# Patient Record
Sex: Female | Born: 1945 | Race: White | Hispanic: No | Marital: Married | Smoking: Former smoker
Health system: Southern US, Community
[De-identification: ages and names within clinical notes are randomized; demographics above are authoritative.]

## PROBLEM LIST (undated history)

## (undated) DIAGNOSIS — R42 Dizziness and giddiness: Secondary | ICD-10-CM

## (undated) DIAGNOSIS — L4050 Arthropathic psoriasis, unspecified: Secondary | ICD-10-CM

## (undated) DIAGNOSIS — Z9221 Personal history of antineoplastic chemotherapy: Secondary | ICD-10-CM

## (undated) DIAGNOSIS — E559 Vitamin D deficiency, unspecified: Secondary | ICD-10-CM

## (undated) DIAGNOSIS — M62838 Other muscle spasm: Secondary | ICD-10-CM

## (undated) DIAGNOSIS — R0789 Other chest pain: Secondary | ICD-10-CM

## (undated) DIAGNOSIS — E039 Hypothyroidism, unspecified: Secondary | ICD-10-CM

## (undated) DIAGNOSIS — I2510 Atherosclerotic heart disease of native coronary artery without angina pectoris: Secondary | ICD-10-CM

## (undated) DIAGNOSIS — L409 Psoriasis, unspecified: Secondary | ICD-10-CM

## (undated) DIAGNOSIS — H269 Unspecified cataract: Secondary | ICD-10-CM

## (undated) DIAGNOSIS — M1990 Unspecified osteoarthritis, unspecified site: Secondary | ICD-10-CM

## (undated) DIAGNOSIS — R32 Unspecified urinary incontinence: Secondary | ICD-10-CM

## (undated) DIAGNOSIS — K219 Gastro-esophageal reflux disease without esophagitis: Secondary | ICD-10-CM

## (undated) DIAGNOSIS — N950 Postmenopausal bleeding: Secondary | ICD-10-CM

## (undated) DIAGNOSIS — K2100 Gastro-esophageal reflux disease with esophagitis, without bleeding: Secondary | ICD-10-CM

## (undated) DIAGNOSIS — Z923 Personal history of irradiation: Secondary | ICD-10-CM

## (undated) DIAGNOSIS — R112 Nausea with vomiting, unspecified: Secondary | ICD-10-CM

## (undated) DIAGNOSIS — C50919 Malignant neoplasm of unspecified site of unspecified female breast: Secondary | ICD-10-CM

## (undated) DIAGNOSIS — R151 Fecal smearing: Secondary | ICD-10-CM

## (undated) DIAGNOSIS — Z9889 Other specified postprocedural states: Secondary | ICD-10-CM

## (undated) DIAGNOSIS — N059 Unspecified nephritic syndrome with unspecified morphologic changes: Secondary | ICD-10-CM

## (undated) DIAGNOSIS — G4700 Insomnia, unspecified: Secondary | ICD-10-CM

## (undated) DIAGNOSIS — E669 Obesity, unspecified: Secondary | ICD-10-CM

## (undated) DIAGNOSIS — I5189 Other ill-defined heart diseases: Secondary | ICD-10-CM

## (undated) DIAGNOSIS — R011 Cardiac murmur, unspecified: Secondary | ICD-10-CM

## (undated) HISTORY — DX: Postmenopausal bleeding: N95.0

## (undated) HISTORY — DX: Unspecified cataract: H26.9

## (undated) HISTORY — DX: Malignant neoplasm of unspecified site of unspecified female breast: C50.919

## (undated) HISTORY — DX: Hypothyroidism, unspecified: E03.9

## (undated) HISTORY — DX: Insomnia, unspecified: G47.00

## (undated) HISTORY — DX: Gastro-esophageal reflux disease with esophagitis, without bleeding: K21.00

## (undated) HISTORY — DX: Vitamin D deficiency, unspecified: E55.9

## (undated) HISTORY — PX: OTHER SURGICAL HISTORY: SHX169

## (undated) HISTORY — DX: Dizziness and giddiness: R42

## (undated) HISTORY — PX: ENDOMETRIAL BIOPSY: SHX622

## (undated) HISTORY — DX: Cardiac murmur, unspecified: R01.1

## (undated) HISTORY — DX: Obesity, unspecified: E66.9

## (undated) HISTORY — DX: Atherosclerotic heart disease of native coronary artery without angina pectoris: I25.10

## (undated) HISTORY — DX: Unspecified urinary incontinence: R32

## (undated) HISTORY — DX: Unspecified nephritic syndrome with unspecified morphologic changes: N05.9

## (undated) HISTORY — DX: Psoriasis, unspecified: L40.9

## (undated) HISTORY — DX: Fecal smearing: R15.1

## (undated) HISTORY — DX: Other chest pain: R07.89

## (undated) HISTORY — DX: Arthropathic psoriasis, unspecified: L40.50

## (undated) HISTORY — PX: CARDIAC CATHETERIZATION: SHX172

## (undated) HISTORY — DX: Other ill-defined heart diseases: I51.89

## (undated) HISTORY — DX: Other muscle spasm: M62.838

## (undated) HISTORY — DX: Gastro-esophageal reflux disease with esophagitis: K21.0

---

## 1983-11-08 HISTORY — PX: MASTECTOMY: SHX3

## 1984-11-07 DIAGNOSIS — C50919 Malignant neoplasm of unspecified site of unspecified female breast: Secondary | ICD-10-CM

## 1984-11-07 DIAGNOSIS — Z923 Personal history of irradiation: Secondary | ICD-10-CM

## 1984-11-07 HISTORY — DX: Personal history of irradiation: Z92.3

## 1984-11-07 HISTORY — DX: Malignant neoplasm of unspecified site of unspecified female breast: C50.919

## 1988-11-07 HISTORY — PX: BREAST BIOPSY: SHX20

## 1988-11-07 HISTORY — PX: BREAST EXCISIONAL BIOPSY: SUR124

## 2004-12-31 ENCOUNTER — Ambulatory Visit: Payer: Self-pay | Admitting: Internal Medicine

## 2005-11-07 LAB — HM COLONOSCOPY: HM Colonoscopy: NORMAL

## 2006-04-04 ENCOUNTER — Inpatient Hospital Stay: Payer: Self-pay | Admitting: Internal Medicine

## 2008-09-11 ENCOUNTER — Ambulatory Visit: Payer: Self-pay | Admitting: Rheumatology

## 2010-10-26 ENCOUNTER — Ambulatory Visit: Payer: Self-pay | Admitting: Family Medicine

## 2011-09-21 ENCOUNTER — Ambulatory Visit: Payer: Self-pay

## 2011-10-11 ENCOUNTER — Ambulatory Visit: Payer: Self-pay | Admitting: Family Medicine

## 2012-06-06 ENCOUNTER — Ambulatory Visit: Payer: Self-pay | Admitting: Family Medicine

## 2012-06-13 ENCOUNTER — Ambulatory Visit: Payer: Self-pay | Admitting: Family Medicine

## 2012-07-02 ENCOUNTER — Ambulatory Visit: Payer: Self-pay | Admitting: Internal Medicine

## 2013-05-04 ENCOUNTER — Ambulatory Visit: Payer: Self-pay | Admitting: Emergency Medicine

## 2013-05-04 LAB — URINALYSIS, COMPLETE
Bilirubin,UR: NEGATIVE
Glucose,UR: NEGATIVE mg/dL (ref 0–75)
Nitrite: POSITIVE
Ph: 6 (ref 4.5–8.0)
Specific Gravity: 1.02 (ref 1.003–1.030)

## 2013-08-14 ENCOUNTER — Ambulatory Visit: Payer: Self-pay | Admitting: Family Medicine

## 2013-08-14 LAB — HM MAMMOGRAPHY: HM MAMMO: NORMAL

## 2013-08-19 LAB — HM PAP SMEAR: HM PAP: NORMAL

## 2013-08-21 ENCOUNTER — Ambulatory Visit: Payer: Self-pay | Admitting: Family Medicine

## 2013-08-21 LAB — HM DEXA SCAN

## 2014-02-19 ENCOUNTER — Encounter: Payer: Self-pay | Admitting: *Deleted

## 2014-02-19 LAB — LIPID PANEL
Cholesterol: 175 mg/dL (ref 0–200)
HDL: 70 mg/dL (ref 35–70)
LDL CALC: 84 mg/dL
Triglycerides: 105 mg/dL (ref 40–160)

## 2014-02-20 ENCOUNTER — Ambulatory Visit (INDEPENDENT_AMBULATORY_CARE_PROVIDER_SITE_OTHER): Payer: Medicare Other | Admitting: Cardiovascular Disease

## 2014-02-20 ENCOUNTER — Encounter: Payer: Self-pay | Admitting: Cardiovascular Disease

## 2014-02-20 ENCOUNTER — Encounter (INDEPENDENT_AMBULATORY_CARE_PROVIDER_SITE_OTHER): Payer: Self-pay

## 2014-02-20 VITALS — BP 145/92 | HR 80 | Ht 67.0 in | Wt 197.8 lb

## 2014-02-20 DIAGNOSIS — R079 Chest pain, unspecified: Secondary | ICD-10-CM

## 2014-02-20 DIAGNOSIS — R0609 Other forms of dyspnea: Secondary | ICD-10-CM

## 2014-02-20 DIAGNOSIS — R0989 Other specified symptoms and signs involving the circulatory and respiratory systems: Secondary | ICD-10-CM

## 2014-02-20 DIAGNOSIS — R000 Tachycardia, unspecified: Secondary | ICD-10-CM

## 2014-02-20 DIAGNOSIS — R0600 Dyspnea, unspecified: Secondary | ICD-10-CM | POA: Insufficient documentation

## 2014-02-20 NOTE — Progress Notes (Signed)
PCP: Dr. Ancil Boozer   HPI  This is a pleasant 68 year old female who was referred for evaluation of chest pain and shortness of breath. She has no previous cardiac history. She had nuclear stress test done in 2003 which was normal. She has known history of hypothyroidism, gastroesophageal reflux disease and history of breast cancer with extensive radiation therapy in 1986. She is retired from [employer]. She is a lifelong nonsmoker with no family history of premature coronary artery disease. Father did have atrial fibrillation and myocardial infarction in his late 61s. Over the last 6 months, she has experienced intermittent episodes of dull chest pain substernally with radiation to the left side. This usually lasts for about 5 minutes. It is random and can happen at rest or with physical activities. She also has noticed progressive dyspnea with activities. She reports being less active since her retirement and has gained about 15 pounds over the last 4-5 years. There is no orthopnea, PND or lower extremity edema.  No Known Allergies   Current Outpatient Prescriptions on File Prior to Visit  Medication Sig Dispense Refill  . Cholecalciferol (VITAMIN D) 2000 UNITS tablet Take 2,000 Units by mouth daily.      Marland Kitchen etanercept (ENBREL) 25 MG injection Inject 25 mg into the skin as needed.      Marland Kitchen levothyroxine (SYNTHROID, LEVOTHROID) 75 MCG tablet Take 75 mcg by mouth daily before breakfast.      . Melatonin 3 MG TABS Take by mouth daily.      . MULTIPLE VITAMINS PO Take by mouth daily.       No current facility-administered medications on file prior to visit.     Past Medical History  Diagnosis Date  . Reflux esophagitis   . Unspecified urinary incontinence   . Fecal smearing   . Insomnia, unspecified   . Obesity, unspecified   . Spasm of muscle   . Unspecified vitamin D deficiency   . Unspecified hypothyroidism   . Psoriatic arthropathy   . History of breast  cancer   . Postmenopausal bleeding   . Dizziness and giddiness   . Bright's disease     Hx  . Heart murmur     as child  . Breast cancer      Past Surgical History  Procedure Laterality Date  . Mastectomy    . Endometrial biopsy    . Colonoscopy    . Cardiac catheterization  30's    [facility]; no stent     Family History  Problem Relation Age of Onset  . Uterine cancer Mother   . Lung cancer Mother   . Stroke Mother   . Hypertension Mother   . Heart disease Father   . Arrhythmia Father     afib  . Cervical cancer Sister      History   Social History  . Marital Status: Married    Spouse Name: N/A    Number of Children: N/A  . Years of Education: N/A   Occupational History  . Not on file.   Social History Main Topics  . Smoking status: Former Smoker -- 0.25 packs/day for 10 years    Types: Cigarettes  . Smokeless tobacco: Not on file  . Alcohol Use: No  . Drug Use: No  . Sexual Activity: Not on file   Other Topics Concern  . Not on file   Social History Narrative  . No narrative on file     ROS A 10  point review of system was performed. It is negative other than that mentioned in the history of present illness.   PHYSICAL EXAM   BP 145/92  Pulse 80  Ht 5\' 7"  (1.702 m)  Wt 197 lb 12 oz (89.699 kg)  BMI 30.96 kg/m2 Constitutional: She is oriented to person, place, and time. She appears well-developed and well-nourished. No distress.  HENT: No nasal discharge.  Head: Normocephalic and atraumatic.  Eyes: Pupils are equal and round. No discharge.  Neck: Normal range of motion. Neck supple. No JVD present. No thyromegaly present.  Cardiovascular: Normal rate, regular rhythm, normal heart sounds. Exam reveals no gallop and no friction rub. No murmur heard.  Pulmonary/Chest: Effort normal and breath sounds normal. No stridor. No respiratory distress. She has no wheezes. She has no rales. She exhibits no tenderness.  Abdominal: Soft. Bowel sounds are  normal. She exhibits no distension. There is no tenderness. There is no rebound and no guarding.  Musculoskeletal: Normal range of motion. She exhibits no edema and no tenderness.  Neurological: She is alert and oriented to person, place, and time. Coordination normal.  Skin: Skin is warm and dry. No rash noted. She is not diaphoretic. No erythema. No pallor.  Psychiatric: She has a normal mood and affect. Her behavior is normal. Judgment and thought content normal.     EKG: Normal sinus rhythm with no significant ST or T wave changes.   ASSESSMENT AND PLAN

## 2014-02-20 NOTE — Assessment & Plan Note (Signed)
Overall atypical chest pain. Cardiac exam is unremarkable and baseline EKG is normal. I recommend further evaluation with a treadmill stress test to rule out ischemic etiology.

## 2014-02-20 NOTE — Assessment & Plan Note (Signed)
She reports progressive exertional dyspnea which could be due to physical deconditioning. However, she received extensive radiation therapy for breast cancer years ago and thus she is at risk for cardiomyopathy or pericardial disease. I requested an echocardiogram to evaluate LV systolic function, valvular structure and  pulmonary pressure.

## 2014-02-20 NOTE — Patient Instructions (Signed)
Your physician has requested that you have an exercise tolerance test. For further information please visit [email or web address]. Please also follow instruction sheet, as given.   Your physician has requested that you have an echocardiogram. Echocardiography is a painless test that uses sound waves to create images of your heart. It provides your doctor with information about the size and shape of your heart and how well your heart's chambers and valves are working. This procedure takes approximately one hour. There are no restrictions for this procedure.  Your physician recommends that you schedule a follow-up appointment in:  As needed

## 2014-03-04 ENCOUNTER — Other Ambulatory Visit (INDEPENDENT_AMBULATORY_CARE_PROVIDER_SITE_OTHER): Payer: Medicare Other

## 2014-03-04 ENCOUNTER — Encounter: Payer: Medicare Other | Admitting: Cardiovascular Disease

## 2014-03-04 ENCOUNTER — Other Ambulatory Visit: Payer: Self-pay

## 2014-03-04 DIAGNOSIS — R0602 Shortness of breath: Secondary | ICD-10-CM

## 2014-03-04 DIAGNOSIS — R079 Chest pain, unspecified: Secondary | ICD-10-CM

## 2014-03-04 DIAGNOSIS — R0600 Dyspnea, unspecified: Secondary | ICD-10-CM

## 2014-03-18 ENCOUNTER — Encounter: Payer: Self-pay | Admitting: Cardiovascular Disease

## 2014-03-18 ENCOUNTER — Ambulatory Visit (INDEPENDENT_AMBULATORY_CARE_PROVIDER_SITE_OTHER): Payer: Medicare Other | Admitting: Cardiovascular Disease

## 2014-03-18 VITALS — BP 134/78 | HR 90 | Ht 67.0 in | Wt 196.2 lb

## 2014-03-18 DIAGNOSIS — R079 Chest pain, unspecified: Secondary | ICD-10-CM

## 2014-03-18 NOTE — Patient Instructions (Signed)
Normal Stress Test.   Exercise on a regular basis.   Follow as needed or if symptoms worsen.

## 2014-03-18 NOTE — Procedures (Signed)
   Treadmill Stress test  Indication: Atypical chest pain and exertional dyspnea  Baseline Data:  Resting EKG shows NSR with rate of 90 bpm, no significant ST or T wave changes Resting blood pressure of 13 4/78 mm Hg Stand bruce protocal was used.  Exercise Data:  Patient exercised for 4 min 10 sec,  Peak heart rate of 160 bpm.  This was 103 % of the maximum predicted heart rate. No symptoms of chest pain or lightheadedness were reported at peak stress or in recovery.  Peak Blood pressure recorded was 174/84 Maximal work level: 6 METs.  Heart rate at 3 minutes in recovery was 100 bpm. BP response: Normal HR response: Accelerated  EKG with Exercise: Sinus tachycardia with no significant ST changes.  FINAL IMPRESSION: Normal exercise stress test. No significant EKG changes concerning for ischemia. Poor exercise tolerance with a deconditioned heart rate response to exercise.  Recommendation: Dyspnea does not seem to be cardiac. If symptoms persist, consider pulmonary evaluation. There is probably a component of physical deconditioning.

## 2014-05-22 DIAGNOSIS — C50919 Malignant neoplasm of unspecified site of unspecified female breast: Secondary | ICD-10-CM | POA: Insufficient documentation

## 2014-05-22 DIAGNOSIS — L4050 Arthropathic psoriasis, unspecified: Secondary | ICD-10-CM | POA: Insufficient documentation

## 2015-09-14 ENCOUNTER — Encounter: Payer: Self-pay | Admitting: Family Medicine

## 2015-09-14 ENCOUNTER — Ambulatory Visit (INDEPENDENT_AMBULATORY_CARE_PROVIDER_SITE_OTHER): Payer: 59 | Admitting: Family Medicine

## 2015-09-14 ENCOUNTER — Other Ambulatory Visit: Payer: Self-pay | Admitting: Family Medicine

## 2015-09-14 VITALS — BP 122/64 | HR 86 | Temp 97.6°F | Resp 16 | Ht 67.0 in | Wt 186.6 lb

## 2015-09-14 DIAGNOSIS — N950 Postmenopausal bleeding: Secondary | ICD-10-CM | POA: Insufficient documentation

## 2015-09-14 DIAGNOSIS — Z23 Encounter for immunization: Secondary | ICD-10-CM

## 2015-09-14 DIAGNOSIS — L4050 Arthropathic psoriasis, unspecified: Secondary | ICD-10-CM | POA: Diagnosis not present

## 2015-09-14 DIAGNOSIS — R300 Dysuria: Secondary | ICD-10-CM | POA: Diagnosis not present

## 2015-09-14 DIAGNOSIS — Z853 Personal history of malignant neoplasm of breast: Secondary | ICD-10-CM | POA: Insufficient documentation

## 2015-09-14 DIAGNOSIS — Z780 Asymptomatic menopausal state: Secondary | ICD-10-CM | POA: Insufficient documentation

## 2015-09-14 DIAGNOSIS — E039 Hypothyroidism, unspecified: Secondary | ICD-10-CM | POA: Insufficient documentation

## 2015-09-14 DIAGNOSIS — K219 Gastro-esophageal reflux disease without esophagitis: Secondary | ICD-10-CM | POA: Insufficient documentation

## 2015-09-14 DIAGNOSIS — E559 Vitamin D deficiency, unspecified: Secondary | ICD-10-CM | POA: Insufficient documentation

## 2015-09-14 DIAGNOSIS — G4700 Insomnia, unspecified: Secondary | ICD-10-CM | POA: Insufficient documentation

## 2015-09-14 DIAGNOSIS — E663 Overweight: Secondary | ICD-10-CM | POA: Insufficient documentation

## 2015-09-14 MED ORDER — CIPROFLOXACIN HCL 250 MG PO TABS: 250.0000 mg | ORAL_TABLET | Freq: Two times a day (BID) | ORAL | Status: DC

## 2015-09-14 NOTE — Addendum Note (Signed)
Addended by: Inda Coke on: 09/14/2015 02:31 PM   Modules accepted: Orders

## 2015-09-14 NOTE — Progress Notes (Signed)
Name: Nicole Contreras   MRN: [id number]    DOB: 1945-12-18   Date:09/14/2015       Progress Note  Subjective  Chief Complaint  Chief Complaint  Patient presents with  . Urinary Tract Infection    onset 2 days low back pain and burning with urination.    HPI  Dysuria: noticed symptoms two days ago, urinary frequency and urgency, also had some dysuria but resolved with AZO. No fever, but had some chills yesterday and also has noticed low back pain, aching like discomfort.   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  It was too costly.  She was given Methotrexate injection last week, and symptoms of UTI started 3 days later she is concerned that may be secondary to immunosuppressive properties of medication  Patient Active Problem List   Diagnosis Date Noted  . Chronic constipation 09/14/2015  . Cannot sleep 09/14/2015  . Gastro-esophageal reflux disease without esophagitis 09/14/2015  . Acquired hypothyroidism 09/14/2015  . Menopause 09/14/2015  . Overweight 09/14/2015  . 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 Hemorrhage, postmenopausal 09/14/2015  . Vitamin D deficiency 09/14/2015  . History of breast cancer 09/14/2015    Past Surgical History  Procedure Laterality Date  . Mastectomy    . Endometrial biopsy    . Colonoscopy    . Cardiac catheterization  73's    [facility]; no stent    Family History  Problem Relation Age of Onset  . Uterine cancer Mother   . Lung cancer Mother   . Stroke Mother   . Hypertension Mother   . Heart disease Father   . Arrhythmia Father     afib  . Cervical cancer Sister     Social History   Social History  . Marital Status: Married    Spouse Name: N/A  . Number of Children: N/A  . Years of Education: N/A   Occupational History   . Not on file.   Social History Main Topics  . Smoking status: Former Smoker -- 0.25 packs/day for 10 years    Types: Cigarettes  . Smokeless tobacco: Never Used  . Alcohol Use: No  . Drug Use: No  . Sexual Activity: Yes   Other Topics Concern  . Not on file   Social History Narrative     Current outpatient prescriptions:  .  Cholecalciferol (VITAMIN D) 2000 UNITS tablet, Take 2,000 Units by mouth daily., Disp: , Rfl:  .  ciprofloxacin (CIPRO) 250 MG tablet, Take 1 tablet (250 mg total) by mouth 2 (two) times daily., Disp: 10 tablet, Rfl: 0 .  Cyanocobalamin (B-12 PO), Take by mouth as needed., Disp: , Rfl:  .  levothyroxine (SYNTHROID, LEVOTHROID) 75 MCG tablet, Take 75 mcg by mouth daily before breakfast., Disp: , Rfl:  .  Melatonin 3 MG TABS, Take by mouth daily., Disp: , Rfl:  .  methotrexate 250 MG/10ML injection, Inject 250 mg into the muscle once a week., Disp: , Rfl:  .  MULTIPLE VITAMINS PO, Take by mouth daily., Disp: , Rfl:   No Known Allergies   ROS  Constitutional: Negative for fever or weight change.  Respiratory: Negative for cough and shortness of breath.   Cardiovascular: Negative for chest pain or palpitations.  Gastrointestinal: Negative for abdominal pain, no bowel changes.  Musculoskeletal: Negative for gait problem or joint swelling.  Skin: Negative for rash.  Neurological: Negative for dizziness or headache.  No other specific complaints in a complete review of systems (except as listed in HPI above). Objective  Filed Vitals:   09/14/15 1123  BP: 122/64  Pulse: 86  Temp: 97.6 F (36.4 C)  TempSrc: Oral  Resp: 16  Height: 5\' 7"  (1.702 m)  Weight: 186 lb 9.6 oz (84.641 kg)  SpO2: 95%    Body mass index is 29.22 kg/(m^2).  Physical Exam  Constitutional: Patient appears well-developed and well-nourished. Obese  No distress.  HEENT: head atraumatic, normocephalic, pupils equal and reactive to light, neck supple, throat within normal  limits Cardiovascular: Normal rate, regular rhythm and normal heart sounds.  No murmur heard. No BLE edema. Pulmonary/Chest: Effort normal and breath sounds normal. No respiratory distress. Abdominal: Soft.  There is no tenderness. Negative CVA tenderness Psychiatric: Patient has a normal mood and affect. behavior is normal. Judgment and thought content normal.  PHQ2/9: Depression screen PHQ 2/9 09/14/2015  Decreased Interest 0  Down, Depressed, Hopeless 0  PHQ - 2 Score 0    Fall Risk: Fall Risk  09/14/2015  Falls in the past year? No    Functional Status Survey: Is the patient deaf or have difficulty hearing?: No Does the patient have difficulty seeing, even when wearing glasses/contacts?: Yes (glasses) Does the patient have difficulty concentrating, remembering, or making decisions?: No Does the patient have difficulty walking or climbing stairs?: No Does the patient have difficulty dressing or bathing?: No Does the patient have difficulty doing errands alone such as visiting a doctor's office or shopping?: No    Assessment & Plan  1. Dysuria  We will treat empirically  - Urine culture - ciprofloxacin (CIPRO) 250 MG tablet; Take 1 tablet (250 mg total) by mouth 2 (two) times daily.  Dispense: 10 tablet; Refill: 0  2. History of breast cancer  - MM Digital Diagnostic Unilat L; Future  3. Need for pneumococcal vaccination  - Pneumococcal conjugate vaccine 13-valent IM  4. Psoriasis with arthropathy (HCC)  Continue methotrexate, explained that it may have caused some immunosuppression that can increase change of infection, but needs to take medication as prescribed

## 2015-09-15 LAB — URINE CULTURE

## 2015-09-15 LAB — PLEASE NOTE

## 2015-09-22 ENCOUNTER — Other Ambulatory Visit: Payer: Self-pay | Admitting: Family Medicine

## 2015-09-22 ENCOUNTER — Ambulatory Visit
Admission: RE | Admit: 2015-09-22 | Discharge: 2015-09-22 | Disposition: A | Discharge status: Home / Self Care | Payer: Medicare Other | Source: Ambulatory Visit | Attending: Family Medicine | Admitting: Family Medicine

## 2015-09-22 DIAGNOSIS — Z853 Personal history of malignant neoplasm of breast: Secondary | ICD-10-CM

## 2015-09-22 DIAGNOSIS — Z1231 Encounter for screening mammogram for malignant neoplasm of breast: Secondary | ICD-10-CM | POA: Insufficient documentation

## 2015-09-22 DIAGNOSIS — Z9011 Acquired absence of right breast and nipple: Secondary | ICD-10-CM | POA: Diagnosis not present

## 2015-11-30 ENCOUNTER — Ambulatory Visit (INDEPENDENT_AMBULATORY_CARE_PROVIDER_SITE_OTHER): Payer: 59 | Admitting: Family Medicine

## 2015-11-30 ENCOUNTER — Encounter: Payer: Self-pay | Admitting: Family Medicine

## 2015-11-30 VITALS — BP 116/74 | HR 96 | Temp 97.6°F | Resp 16 | Ht 67.0 in | Wt 188.4 lb

## 2015-11-30 DIAGNOSIS — Z131 Encounter for screening for diabetes mellitus: Secondary | ICD-10-CM

## 2015-11-30 DIAGNOSIS — M62838 Other muscle spasm: Secondary | ICD-10-CM | POA: Diagnosis not present

## 2015-11-30 DIAGNOSIS — Z1211 Encounter for screening for malignant neoplasm of colon: Secondary | ICD-10-CM

## 2015-11-30 DIAGNOSIS — R002 Palpitations: Secondary | ICD-10-CM

## 2015-11-30 DIAGNOSIS — Z9011 Acquired absence of right breast and nipple: Secondary | ICD-10-CM

## 2015-11-30 DIAGNOSIS — D229 Melanocytic nevi, unspecified: Secondary | ICD-10-CM | POA: Insufficient documentation

## 2015-11-30 DIAGNOSIS — Z0000 Encounter for general adult medical examination without abnormal findings: Secondary | ICD-10-CM | POA: Diagnosis not present

## 2015-11-30 DIAGNOSIS — E039 Hypothyroidism, unspecified: Secondary | ICD-10-CM

## 2015-11-30 MED ORDER — TIZANIDINE HCL 2 MG PO TABS: 2.0000 mg | ORAL_TABLET | Freq: Every day | ORAL | Status: DC

## 2015-11-30 MED ORDER — ASPIRIN EC 81 MG PO TBEC: 81.0000 mg | DELAYED_RELEASE_TABLET | Freq: Every day | ORAL | Status: DC

## 2015-11-30 NOTE — Progress Notes (Signed)
Name: Nicole Contreras   MRN: [id number]    DOB: 08-07-1946   Date:11/30/2015       Progress Note  Subjective  Chief Complaint  Chief Complaint  Patient presents with  . Annual Exam    HPI  Functional ability/safety issues: No Issues Hearing issues: Addressed  Activities of daily living: Discussed Home safety issues: No Issues  End Of Life Planning: Offered verbal information regarding advanced directives, healthcare power of attorney.  Preventative care, Health maintenance, Preventative health measures discussed.  Preventative screenings discussed today: lab work - done by [employer], last lipid in 2015 was normal , colonoscopy is due , PAP no need - normal pap's until age 79 , mammogram i up to date , DEXA - she does not want it at this time  Low Dose CT Chest recommended if Age 51-80 years, 30 pack-year currently smoking OR have quit w/in 15years.   Lifestyle risk factor issued reviewed: Diet, exercise, weight management, advised patient smoking is not healthy, nutrition/diet.  Preventative health measures discussed (5-10 year plan).  Reviewed and recommended vaccinations: - Pneumovax  - Prevnar  - Annual Influenza - Zostavax - Tdap   Depression screening: Done Fall risk screening: Done Discuss ADLs/IADLs: Done  Current medical providers: See HPI  Other health risk factors identified this visit: No other issues Cognitive impairment issues: None identified  All above discussed with patient. Appropriate education, counseling and referral will be made based upon the above.   Neck pain: she states she has a long history of right side neck pain/stiffness, started after right mastectomy. Pain is described as a tightness, radiates to right nuchal area and causes a headache. She is doing well on prn Tizanidine  Hypothyroidism: she takes Synthroid, she is compliant, she has chronic constipation, no hair loss or weight changes.   Insomnia: taking Melatonin at night,  it makes her mouth dry, advised to try humidifier to see if helps or otc nasal steroid, to decrease mouth breathing.   Palpitation: she has seen cardiologist for similar symptoms in the past, however she states there is a significant family history of Afib ( father, sister and 3 aunts ), she states she continues to have intermittent episodes of palpitation associated with chest discomfort that can last up to one day. Last episode was yesterday. Lasted all day. Discussed event monitor or go to Urgent Care or come in for EKG if happens again. Call 911 if significant symptoms with SOB and diaphoresis.     Patient Active Problem List   Diagnosis Date Noted  . Muscle spasms of neck 11/30/2015  . Chronic constipation 09/14/2015  . Insomnia 09/14/2015  . Gastro-esophageal reflux disease without esophagitis 09/14/2015  . Acquired hypothyroidism 09/14/2015  . Menopause 09/14/2015  . Overweight 09/14/2015  . Hemorrhage, postmenopausal 09/14/2015  . Vitamin D deficiency 09/14/2015  . History of breast cancer 09/14/2015  . Psoriatic arthritis ([place]) 05/22/2014    Past Surgical History  Procedure Laterality Date  . Endometrial biopsy    . Colonoscopy    . Cardiac catheterization  90's    [facility]; no stent  . Mastectomy  1985  . Breast biopsy Left 1990    X 2    Family History  Problem Relation Age of Onset  . Uterine cancer Mother   . Lung cancer Mother   . Stroke Mother   . Hypertension Mother   . Heart disease Father   . Arrhythmia Father     afib  . Cervical cancer Sister   .  Breast cancer Paternal Aunt 22    Social History   Social History  . Marital Status: Married    Spouse Name: N/A  . Number of Children: N/A  . Years of Education: N/A   Occupational History  . Not on file.   Social History Main Topics  . Smoking status: Former Smoker -- 0.25 packs/day for 10 years    Types: Cigarettes  . Smokeless tobacco: Never Used  . Alcohol Use: No  . Drug Use: No  . Sexual  Activity: Yes   Other Topics Concern  . Not on file   Social History Narrative     Current outpatient prescriptions:  .  Cholecalciferol (VITAMIN D) 2000 UNITS tablet, Take 2,000 Units by mouth daily., Disp: , Rfl:  .  Cyanocobalamin (B-12 PO), Take by mouth as needed., Disp: , Rfl:  .  levothyroxine (SYNTHROID, LEVOTHROID) 75 MCG tablet, Take 75 mcg by mouth daily before breakfast., Disp: , Rfl:  .  Melatonin 3 MG TABS, Take by mouth daily., Disp: , Rfl:  .  methotrexate 250 MG/10ML injection, Inject 250 mg into the muscle once a week., Disp: , Rfl:  .  MULTIPLE VITAMINS PO, Take by mouth daily., Disp: , Rfl:  .  tiZANidine (ZANAFLEX) 2 MG tablet, Take 1 tablet (2 mg total) by mouth at bedtime., Disp: 30 tablet, Rfl: 1  No Known Allergies   ROS  Constitutional: Negative for fever or weight change.  Respiratory: Negative for cough and shortness of breath.   Cardiovascular: Negative for chest pain , positive for intermittent palpitation   Gastrointestinal: Negative for abdominal pain, no bowel changes.  Musculoskeletal: Negative for gait problem or joint swelling.  Skin: Negative for rash.  Neurological: Negative for dizziness or headache.  No other specific complaints in a complete review of systems (except as listed in HPI above).  Objective  Filed Vitals:   11/30/15 1113  BP: 116/74  Pulse: 96  Temp: 97.6 F (36.4 C)  TempSrc: Oral  Resp: 16  Height: 5\' 7"  (1.702 m)  Weight: 188 lb 6.4 oz (85.458 kg)  SpO2: 99%    Body mass index is 29.5 kg/(m^2).  Physical Exam  Constitutional: Patient appears well-developed and well-nourished. No distress.  HENT: Head: Normocephalic and atraumatic. Ears: B TMs ok, no erythema or effusion; Nose: Nose normal. Mouth/Throat: Oropharynx is clear and moist. No oropharyngeal exudate.  Eyes: Conjunctivae and EOM are normal. Pupils are equal, round, and reactive to light. No scleral icterus.  Neck: Normal range of motion. Neck supple.  No JVD present. No thyromegaly present.  Cardiovascular: Normal rate, regular rhythm and normal heart sounds.  No murmur heard. No BLE edema. Pulmonary/Chest: Effort normal and breath sounds normal. No respiratory distress. Abdominal: Soft. Bowel sounds are normal, no distension. There is no tenderness. no masses Breast: no lumps or masses, no nipple discharge or rashes on left side, right breast s/p mastectomy FEMALE GENITALIA:  Not done RECTAL: not done Musculoskeletal: Normal range of motion, no joint effusions. No gross deformities Neurological: he is alert and oriented to person, place, and time. No cranial nerve deficit. Coordination, balance, strength, speech and gait are normal.  Skin: Skin is warm and dry. No rash noted. No erythema. Numerous moles - discussed referral to Dermatologist but she would like to hold off for now Psychiatric: Patient has a normal mood and affect. behavior is normal. Judgment and thought content normal.  Recent Results (from the past 2160 hour(s))  Urine culture  Status: None   Collection Time: 09/14/15 12:00 AM  Result Value Ref Range   Urine Culture, Routine Final report    Urine Culture result 1 Comment     Comment: Mixed urogenital flora Less than 10,000 colonies/mL   Please Note     Status: None   Collection Time: 09/14/15 12:00 AM  Result Value Ref Range   Please note Comment     Comment: The date and/or time of collection was not indicated on the requisition as required by state and federal law.  The date of receipt of the specimen was used as the collection date if not supplied.      PHQ2/9: Depression screen [facility] 2/9 11/30/2015 09/14/2015  Decreased Interest 0 0  Down, Depressed, Hopeless 0 0  PHQ - 2 Score 0 0    Fall Risk: Fall Risk  11/30/2015 09/14/2015  Falls in the past year? No No    Functional Status Survey: Is the patient deaf or have difficulty hearing?: No Does the patient have difficulty seeing, even when wearing  glasses/contacts?: Yes (reading glasses) Does the patient have difficulty concentrating, remembering, or making decisions?: No Does the patient have difficulty walking or climbing stairs?: No Does the patient have difficulty dressing or bathing?: No Does the patient have difficulty doing errands alone such as visiting a doctor's office or shopping?: No    Assessment & Plan  1. Medicare annual wellness visit, subsequent  Discussed importance of 150 minutes of physical activity weekly, eat two servings of fish weekly, eat one serving of tree nuts ( cashews, pistachios, pecans, almonds.Marland Kitchen) every other day, eat 6 servings of fruit/vegetables daily and drink plenty of water and avoid sweet beverages.   2. History of mastectomy, right  Order for Right breast prosthesis   3. Muscle spasms of neck  - tiZANidine (ZANAFLEX) 2 MG tablet; Take 1 tablet (2 mg total) by mouth at bedtime.  Dispense: 30 tablet; Refill: 1  4. Colon cancer screening  - Ambulatory referral to Gastroenterology  5. Acquired hypothyroidism  - TSH  6. Diabetes mellitus screening  - Glucose  7. Palpitation   8. Numerous moles

## 2015-11-30 NOTE — Patient Instructions (Signed)
  Nicole Contreras , Thank you for taking time to come for your Medicare Wellness Visit. I appreciate your ongoing commitment to your health goals. Please review the following plan we discussed and let me know if I can assist you in the future.   These are the goals we discussed:  Weight loss Colonoscopy Go to [facility], Urgent Care or come in , if recurrence of palpitation    This is a list of the screening recommended for you and due dates:  Health Maintenance  Topic Date Due  . Colon Cancer Screening  11/08/2015  . Flu Shot  06/07/2016  . Mammogram  09/21/2017  . Pneumonia vaccines (2 of 2 - PPSV23) 08/19/2018  . Tetanus Vaccine  10/05/2020  . DEXA scan (bone density measurement)  Completed  . Shingles Vaccine  Completed  .  Hepatitis C: One time screening is recommended by Center for Disease Control  (CDC) for  adults born from [birth year] through 1963.   Completed

## 2015-12-01 ENCOUNTER — Other Ambulatory Visit: Payer: Self-pay | Admitting: Family Medicine

## 2015-12-01 DIAGNOSIS — R739 Hyperglycemia, unspecified: Secondary | ICD-10-CM

## 2015-12-01 LAB — GLUCOSE, RANDOM: GLUCOSE: 100 mg/dL — AB (ref 65–99)

## 2015-12-01 LAB — TSH: TSH: 1.56 u[IU]/mL (ref 0.450–4.500)

## 2015-12-02 NOTE — Addendum Note (Signed)
Addended by: Steele Sizer F on: 12/02/2015 02:21 PM   Modules accepted: Miquel Dunn

## 2015-12-25 ENCOUNTER — Other Ambulatory Visit: Payer: Self-pay | Admitting: Family Medicine

## 2016-01-25 ENCOUNTER — Encounter: Payer: 59 | Admitting: Family Medicine

## 2016-02-01 ENCOUNTER — Encounter: Payer: Self-pay | Admitting: *Deleted

## 2016-02-03 ENCOUNTER — Encounter: Payer: Self-pay | Admitting: *Deleted

## 2016-02-04 ENCOUNTER — Encounter: Payer: Self-pay | Admitting: Anesthesiology

## 2016-02-04 ENCOUNTER — Encounter
Admission: RE | Disposition: A | Discharge status: Home / Self Care | Payer: Self-pay | Source: Ambulatory Visit | Attending: Ophthalmology

## 2016-02-04 ENCOUNTER — Ambulatory Visit: Payer: Medicare Other | Admitting: Anesthesiology

## 2016-02-04 ENCOUNTER — Ambulatory Visit
Admission: RE | Admit: 2016-02-04 | Discharge: 2016-02-04 | Disposition: A | Discharge status: Home / Self Care | Payer: Medicare Other | Source: Ambulatory Visit | Attending: Ophthalmology | Admitting: Ophthalmology

## 2016-02-04 DIAGNOSIS — Z87891 Personal history of nicotine dependence: Secondary | ICD-10-CM | POA: Insufficient documentation

## 2016-02-04 DIAGNOSIS — Z87898 Personal history of other specified conditions: Secondary | ICD-10-CM | POA: Diagnosis not present

## 2016-02-04 DIAGNOSIS — E039 Hypothyroidism, unspecified: Secondary | ICD-10-CM | POA: Insufficient documentation

## 2016-02-04 DIAGNOSIS — R011 Cardiac murmur, unspecified: Secondary | ICD-10-CM | POA: Diagnosis not present

## 2016-02-04 DIAGNOSIS — L4050 Arthropathic psoriasis, unspecified: Secondary | ICD-10-CM | POA: Insufficient documentation

## 2016-02-04 DIAGNOSIS — Z853 Personal history of malignant neoplasm of breast: Secondary | ICD-10-CM | POA: Insufficient documentation

## 2016-02-04 DIAGNOSIS — M62838 Other muscle spasm: Secondary | ICD-10-CM | POA: Insufficient documentation

## 2016-02-04 DIAGNOSIS — K219 Gastro-esophageal reflux disease without esophagitis: Secondary | ICD-10-CM | POA: Insufficient documentation

## 2016-02-04 DIAGNOSIS — H2511 Age-related nuclear cataract, right eye: Secondary | ICD-10-CM | POA: Insufficient documentation

## 2016-02-04 DIAGNOSIS — Z9011 Acquired absence of right breast and nipple: Secondary | ICD-10-CM | POA: Insufficient documentation

## 2016-02-04 HISTORY — PX: CATARACT EXTRACTION W/PHACO: SHX586

## 2016-02-04 SURGERY — PHACOEMULSIFICATION, CATARACT, WITH IOL INSERTION
Anesthesia: Monitor Anesthesia Care | Site: Eye | Laterality: Right | Wound class: Clean

## 2016-02-04 MED ORDER — MIDAZOLAM HCL 2 MG/2ML IJ SOLN
INTRAMUSCULAR | Status: DC | PRN
  Administered 2016-02-04: 1 mg via INTRAVENOUS

## 2016-02-04 MED ORDER — FENTANYL CITRATE (PF) 100 MCG/2ML IJ SOLN
INTRAMUSCULAR | Status: DC | PRN
  Administered 2016-02-04: 50 ug via INTRAVENOUS

## 2016-02-04 MED ORDER — CARBACHOL 0.01 % IO SOLN
INTRAOCULAR | Status: DC | PRN
  Administered 2016-02-04: .5 mL via INTRAOCULAR

## 2016-02-04 MED ORDER — NA CHONDROIT SULF-NA HYALURON 40-17 MG/ML IO SOLN
INTRAOCULAR | Status: AC
  Filled 2016-02-04: qty 1

## 2016-02-04 MED ORDER — POVIDONE-IODINE 5 % OP SOLN
1.0000 "application " | Freq: Once | OPHTHALMIC | Status: AC
  Administered 2016-02-04: 1 via OPHTHALMIC

## 2016-02-04 MED ORDER — MOXIFLOXACIN HCL 0.5 % OP SOLN
OPHTHALMIC | Status: AC
  Filled 2016-02-04: qty 3

## 2016-02-04 MED ORDER — PROPOFOL 10 MG/ML IV BOLUS
INTRAVENOUS | Status: DC | PRN
  Administered 2016-02-04: 10 mg via INTRAVENOUS

## 2016-02-04 MED ORDER — SODIUM CHLORIDE 0.9 % IV SOLN
INTRAVENOUS | Status: DC
  Administered 2016-02-04: 10:00:00 via INTRAVENOUS

## 2016-02-04 MED ORDER — POVIDONE-IODINE 5 % OP SOLN
OPHTHALMIC | Status: AC
  Administered 2016-02-04: 1 via OPHTHALMIC
  Filled 2016-02-04: qty 30

## 2016-02-04 MED ORDER — EPINEPHRINE HCL 1 MG/ML IJ SOLN
INTRAMUSCULAR | Status: AC
  Filled 2016-02-04: qty 1

## 2016-02-04 MED ORDER — CEFUROXIME OPHTHALMIC INJECTION 1 MG/0.1 ML
INJECTION | OPHTHALMIC | Status: DC | PRN
  Administered 2016-02-04: .1 mL via INTRACAMERAL

## 2016-02-04 MED ORDER — ARMC OPHTHALMIC DILATING GEL
OPHTHALMIC | Status: AC
  Administered 2016-02-04: 1 via OPHTHALMIC
  Filled 2016-02-04: qty 0.25

## 2016-02-04 MED ORDER — CEFUROXIME OPHTHALMIC INJECTION 1 MG/0.1 ML
INJECTION | OPHTHALMIC | Status: AC
  Filled 2016-02-04: qty 0.1

## 2016-02-04 MED ORDER — NA CHONDROIT SULF-NA HYALURON 40-17 MG/ML IO SOLN
INTRAOCULAR | Status: DC | PRN
  Administered 2016-02-04: 1 mL via INTRAOCULAR

## 2016-02-04 MED ORDER — TETRACAINE HCL 0.5 % OP SOLN
1.0000 [drp] | Freq: Once | OPHTHALMIC | Status: AC
  Administered 2016-02-04: 1 [drp] via OPHTHALMIC

## 2016-02-04 MED ORDER — TETRACAINE HCL 0.5 % OP SOLN
OPHTHALMIC | Status: AC
  Administered 2016-02-04: 1 [drp] via OPHTHALMIC
  Filled 2016-02-04: qty 2

## 2016-02-04 MED ORDER — MOXIFLOXACIN HCL 0.5 % OP SOLN: 1.0000 [drp] | OPHTHALMIC | Status: DC | PRN

## 2016-02-04 MED ORDER — MOXIFLOXACIN HCL 0.5 % OP SOLN
OPHTHALMIC | Status: DC | PRN
  Administered 2016-02-04: 1 [drp] via OPHTHALMIC

## 2016-02-04 MED ORDER — EPINEPHRINE HCL 1 MG/ML IJ SOLN
INTRAOCULAR | Status: DC | PRN
  Administered 2016-02-04: 1 mL via OPHTHALMIC

## 2016-02-04 MED ORDER — ARMC OPHTHALMIC DILATING GEL
1.0000 "application " | OPHTHALMIC | Status: AC | PRN
  Administered 2016-02-04 (×2): 1 via OPHTHALMIC

## 2016-02-04 SURGICAL SUPPLY — 22 items
CANNULA ANT/CHMB 27GA (MISCELLANEOUS) ×3 IMPLANT
CUP MEDICINE 2OZ PLAST GRAD ST (MISCELLANEOUS) ×3 IMPLANT
GLOVE BIO SURGEON STRL SZ8 (GLOVE) ×3 IMPLANT
GLOVE BIOGEL M 6.5 STRL (GLOVE) ×3 IMPLANT
GLOVE SURG LX 8.0 MICRO (GLOVE) ×2
GLOVE SURG LX STRL 8.0 MICRO (GLOVE) ×1 IMPLANT
GOWN STRL REUS W/ TWL LRG LVL3 (GOWN DISPOSABLE) ×2 IMPLANT
GOWN STRL REUS W/TWL LRG LVL3 (GOWN DISPOSABLE) ×4
LENS IOL TECNIS 23.5 (Intraocular Lens) ×3 IMPLANT
LENS IOL TECNIS MONO 1P 23.5 (Intraocular Lens) ×1 IMPLANT
PACK CATARACT (MISCELLANEOUS) ×3 IMPLANT
PACK CATARACT BRASINGTON LX (MISCELLANEOUS) ×3 IMPLANT
PACK EYE AFTER SURG (MISCELLANEOUS) ×3 IMPLANT
SOL BSS BAG (MISCELLANEOUS) ×3
SOL PREP PVP 2OZ (MISCELLANEOUS) ×3
SOLUTION BSS BAG (MISCELLANEOUS) ×1 IMPLANT
SOLUTION PREP PVP 2OZ (MISCELLANEOUS) ×1 IMPLANT
SYR 3ML LL SCALE MARK (SYRINGE) ×3 IMPLANT
SYR 5ML LL (SYRINGE) ×3 IMPLANT
SYR TB 1ML 27GX1/2 LL (SYRINGE) ×3 IMPLANT
WATER STERILE IRR 1000ML POUR (IV SOLUTION) ×3 IMPLANT
WIPE NON LINTING 3.25X3.25 (MISCELLANEOUS) ×3 IMPLANT

## 2016-02-04 NOTE — Op Note (Signed)
PREOPERATIVE DIAGNOSIS:  Nuclear sclerotic cataract of the right eye.   POSTOPERATIVE DIAGNOSIS: nuclear sclerotic cataract right eye   OPERATIVE PROCEDURE:  Procedure(s): CATARACT EXTRACTION PHACO AND INTRAOCULAR LENS PLACEMENT (IOC)   SURGEON:  Birder Robson, MD.   ANESTHESIA:  Anesthesiologist: Alvin Critchley, MD CRNA: Kennon Holter, CRNA  1.      Managed anesthesia care. 2.      Topical tetracaine drops followed by 2% Xylocaine jelly applied in the preoperative holding area.   COMPLICATIONS:  None.   TECHNIQUE:   Stop and chop   DESCRIPTION OF PROCEDURE:  The patient was examined and consented in the preoperative holding area where the aforementioned topical anesthesia was applied to the right eye and then brought back to the Operating Room where the right eye was prepped and draped in the usual sterile ophthalmic fashion and a lid speculum was placed. A paracentesis was created with the side port blade and the anterior chamber was filled with viscoelastic. A near clear corneal incision was performed with the steel keratome. A continuous curvilinear capsulorrhexis was performed with a cystotome followed by the capsulorrhexis forceps. Hydrodissection and hydrodelineation were carried out with BSS on a blunt cannula. The lens was removed in a stop and chop  technique and the remaining cortical material was removed with the irrigation-aspiration handpiece. The capsular bag was inflated with viscoelastic and the Technis [id number]  lens was placed in the capsular bag without complication. The remaining viscoelastic was removed from the eye with the irrigation-aspiration handpiece. The wounds were hydrated. The anterior chamber was flushed with Miostat and the eye was inflated to physiologic pressure. 0.1 mL of cefuroxime concentration 10 mg/mL was placed in the anterior chamber. The wounds were found to be water tight. The eye was dressed with Vigamox. The patient was given protective glasses to  wear throughout the day and a shield with which to sleep tonight. The patient was also given drops with which to begin a drop regimen today and will follow-up with me in one day.  Implant Name Type Inv. Item Serial No. Manufacturer Lot No. LRB No. Used  LENS IOL TECNIS 23.5 - [id number] Intraocular Lens LENS IOL TECNIS 23.5 [id number] AMO [id number] Right 1   Procedure(s) with comments: CATARACT EXTRACTION PHACO AND INTRAOCULAR LENS PLACEMENT (IOC) (Right) - [place] 00:44 AP% 19.2 CDE 8.49 fluid pack lot # [id number]  Electronically signed: [place] 02/04/2016 11:11 AM

## 2016-02-04 NOTE — Anesthesia Preprocedure Evaluation (Signed)
Anesthesia Evaluation  Patient identified by MRN, date of birth, ID band Patient awake    Reviewed: Allergy & Precautions, NPO status , Patient's Chart, lab work & pertinent test results  Airway Mallampati: IV  TM Distance: <3 FB Neck ROM: Full    Dental  (+) Caps   Pulmonary former smoker,    Pulmonary exam normal        Cardiovascular Normal cardiovascular exam+ Valvular Problems/Murmurs      Neuro/Psych negative psych ROS   GI/Hepatic Neg liver ROS, GERD  Medicated and Controlled,  Endo/Other  Hypothyroidism   Renal/GU Renal diseaseBrights DZ     Musculoskeletal Muscle spasms of neck   Abdominal Normal abdominal exam  (+)   Peds  Hematology negative hematology ROS (+)   Anesthesia Other Findings   Reproductive/Obstetrics                             Anesthesia Physical Anesthesia Plan  ASA: III  Anesthesia Plan: MAC   Post-op Pain Management:    Induction: Intravenous  Airway Management Planned: Nasal Cannula  Additional Equipment:   Intra-op Plan:   Post-operative Plan:   Informed Consent: I have reviewed the patients History and Physical, chart, labs and discussed the procedure including the risks, benefits and alternatives for the proposed anesthesia with the patient or authorized representative who has indicated his/her understanding and acceptance.   Dental advisory given  Plan Discussed with: CRNA and Surgeon  Anesthesia Plan Comments:         Anesthesia Quick Evaluation

## 2016-02-04 NOTE — H&P (Signed)
All labs reviewed. Abnormal studies sent to patients PCP when indicated.  Previous H&P reviewed, patient examined, there are NO CHANGES.  Nicole Contreras LOUIS3/30/201710:30 AM

## 2016-02-04 NOTE — Anesthesia Postprocedure Evaluation (Signed)
Anesthesia Post Note  Patient: Nicole Contreras  Procedure(s) Performed: Procedure(s) (LRB): CATARACT EXTRACTION PHACO AND INTRAOCULAR LENS PLACEMENT (IOC) (Right)  Patient location during evaluation: Short Stay Anesthesia Type: MAC Level of consciousness: awake, awake and alert and oriented Pain management: satisfactory to patient Vital Signs Assessment: post-procedure vital signs reviewed and stable Respiratory status: spontaneous breathing Cardiovascular status: blood pressure returned to baseline Postop Assessment: no headache Anesthetic complications: no Comments: VSS, nAD, comfortable.    Last Vitals:  Filed Vitals:   02/04/16 1058  Temp: 36.6 C    Last Pain: There were no vitals filed for this visit.               Reyes Aldaco, RON

## 2016-02-04 NOTE — Addendum Note (Signed)
Addendum  created 02/04/16 1112 by Birder Robson, MD   Modules edited: Notes Section, Orders   Notes Section:  File: [id number]

## 2016-02-04 NOTE — Transfer of Care (Signed)
Immediate Anesthesia Transfer of Care Note  Patient: Nicole Contreras  Procedure(s) Performed: Procedure(s) with comments: CATARACT EXTRACTION PHACO AND INTRAOCULAR LENS PLACEMENT (IOC) (Right) - [place] 00:44 AP% 19.2 CDE 8.49 fluid pack lot # [id number]  Patient Location: PACU and Short Stay  Anesthesia Type:MAC  Level of Consciousness: awake, alert  and oriented  Airway & Oxygen Therapy: Patient Spontanous Breathing  Post-op Assessment: Report given to RN and Post -op Vital signs reviewed and stable  Post vital signs: Reviewed and stable  Last Vitals:  Filed Vitals:   02/04/16 1058  Temp: 123XX123 C    Complications: No apparent anesthesia complications

## 2016-02-04 NOTE — Discharge Instructions (Signed)
AMBULATORY SURGERY  °DISCHARGE INSTRUCTIONS ° ° °1) The drugs that you were given will stay in your system until tomorrow so for the next 24 hours you should not: ° °A) Drive an automobile °B) Make any legal decisions °C) Drink any alcoholic beverage ° ° °2) You may resume regular meals tomorrow.  Today it is better to start with liquids and gradually work up to solid foods. ° °You may eat anything you prefer, but it is better to start with liquids, then soup and crackers, and gradually work up to solid foods. ° ° °3) Please notify your doctor immediately if you have any unusual bleeding, trouble breathing, redness and pain at the surgery site, drainage, fever, or pain not relieved by medication. ° ° ° °4) Additional Instructions: ° ° ° °Eye Surgery Discharge Instructions ° °Expect mild scratchy sensation or mild soreness. °DO NOT RUB YOUR EYE! ° °The day of surgery: °• Minimal physical activity, but bed rest is not required °• No reading, computer work, or close hand work °• No bending, lifting, or straining. °• May watch TV ° °For 24 hours: °• No driving, legal decisions, or alcoholic beverages °• Safety precautions °• Eat anything you prefer: It is better to start with liquids, then soup then solid foods. °• _____ Eye patch should be worn until postoperative exam tomorrow. °• ____ Solar shield eyeglasses should be worn for comfort in the sunlight/patch while sleeping ° °Resume all regular medications including aspirin or Coumadin if these were discontinued prior to surgery. °You may shower, bathe, shave, or wash your hair. °Tylenol may be taken for mild discomfort. ° °Call your doctor if you experience significant pain, nausea, or vomiting, fever > 101 or other signs of infection. [phone] or [phone] °Specific instructions: ° ° ° ° ° °Please contact your physician with any problems or Same Day Surgery at [phone], Monday through Friday 6 am to 4 pm, or [place] [facility] Main number at  [phone]. °

## 2016-04-01 ENCOUNTER — Other Ambulatory Visit: Payer: Self-pay | Admitting: Family Medicine

## 2016-04-01 NOTE — Telephone Encounter (Signed)
Patient requesting refill. 

## 2016-05-30 ENCOUNTER — Telehealth: Payer: Self-pay

## 2016-05-30 ENCOUNTER — Other Ambulatory Visit: Payer: Self-pay

## 2016-05-30 ENCOUNTER — Ambulatory Visit (INDEPENDENT_AMBULATORY_CARE_PROVIDER_SITE_OTHER): Payer: 59 | Admitting: Family Medicine

## 2016-05-30 ENCOUNTER — Encounter: Payer: Self-pay | Admitting: Family Medicine

## 2016-05-30 VITALS — BP 126/74 | HR 100 | Temp 98.6°F | Resp 18 | Ht 67.0 in | Wt 188.4 lb

## 2016-05-30 DIAGNOSIS — K5900 Constipation, unspecified: Secondary | ICD-10-CM

## 2016-05-30 DIAGNOSIS — E039 Hypothyroidism, unspecified: Secondary | ICD-10-CM | POA: Diagnosis not present

## 2016-05-30 DIAGNOSIS — E559 Vitamin D deficiency, unspecified: Secondary | ICD-10-CM

## 2016-05-30 DIAGNOSIS — E663 Overweight: Secondary | ICD-10-CM | POA: Diagnosis not present

## 2016-05-30 DIAGNOSIS — Z79899 Other long term (current) drug therapy: Secondary | ICD-10-CM

## 2016-05-30 DIAGNOSIS — M6248 Contracture of muscle, other site: Secondary | ICD-10-CM

## 2016-05-30 DIAGNOSIS — M62838 Other muscle spasm: Secondary | ICD-10-CM

## 2016-05-30 DIAGNOSIS — K5909 Other constipation: Secondary | ICD-10-CM

## 2016-05-30 DIAGNOSIS — L4050 Arthropathic psoriasis, unspecified: Secondary | ICD-10-CM

## 2016-05-30 LAB — COMPLETE METABOLIC PANEL WITH GFR
ALBUMIN: 4.3 g/dL (ref 3.6–5.1)
ALK PHOS: 82 U/L (ref 33–130)
ALT: 14 U/L (ref 6–29)
AST: 20 U/L (ref 10–35)
BUN: 18 mg/dL (ref 7–25)
CALCIUM: 9.6 mg/dL (ref 8.6–10.4)
CHLORIDE: 104 mmol/L (ref 98–110)
CO2: 28 mmol/L (ref 20–31)
Creat: 0.79 mg/dL (ref 0.50–0.99)
GFR, EST NON AFRICAN AMERICAN: 78 mL/min (ref >=60)
Glucose, Bld: 94 mg/dL (ref 65–99)
POTASSIUM: 4.6 mmol/L (ref 3.5–5.3)
Sodium: 139 mmol/L (ref 135–146)
Total Bilirubin: 0.4 mg/dL (ref 0.2–1.2)
Total Protein: 6.8 g/dL (ref 6.1–8.1)

## 2016-05-30 LAB — TSH: TSH: 1.11 mIU/L

## 2016-05-30 MED ORDER — POLYETHYLENE GLYCOL 3350 17 GM/SCOOP PO POWD: 17.0000 g | Freq: Every day | ORAL | 1 refills | Status: DC

## 2016-05-30 NOTE — Telephone Encounter (Signed)
Need to schedule patient for a screening colonoscopy. Called patient and LVM. Will try again at a later time.

## 2016-05-30 NOTE — Progress Notes (Signed)
Name: Nicole Contreras   MRN: [id number]    DOB: 08/06/1946   Date:05/30/2016       Progress Note  Subjective  Chief Complaint  Chief Complaint  Patient presents with  . Thyroid Problem    6 month follow up for hyperthyroidism  . Obesity  . Psoriasis    HPI  Neck pain: she states she has a long history of right side neck pain/stiffness, started after right mastectomy. Pain is described as a tightness, radiates to right nuchal area and causes a headache. She is doing well on prn Tizanidine. She only takes it prn and still has medication at home.   Hypothyroidism: she takes Synthroid, she is compliant, she has chronic constipation, no hair loss or weight changes. Palpitation resolved  Insomnia: she states she is sleeping well now, she has been walking one hour per day with her husband  Palpitation: she has seen cardiologist for similar symptoms in the past, however she states there is a significant family history of Afib ( father, sister and 3 aunts , she states symptoms resolved  Psoriasis: she only use Methotrexate injections about once or twice a couple of times weekly when symptoms are flaring. She states that when she has it every week it causes UTI. She sees Dr. Jefm Bryant.   Chronic Constipation: she states her diet has helped but still does not feel like emptying completely with her bowel movements. Advised Miralax daily and she is due for colonoscopy - she states feels like there is a blockage.   Overweight: she is on a better diet, taking a lot of supplementation, I am worried about her kidney and liver function and we will check labs.    Patient Active Problem List   Diagnosis Date Noted  . Muscle spasms of neck 11/30/2015  . Numerous moles 11/30/2015  . Chronic constipation 09/14/2015  . Gastro-esophageal reflux disease without esophagitis 09/14/2015  . Acquired hypothyroidism 09/14/2015  . Menopause 09/14/2015  . Overweight 09/14/2015  . Hemorrhage, postmenopausal  09/14/2015  . Vitamin D deficiency 09/14/2015  . History of breast cancer 09/14/2015  . Psoriatic arthritis ([place]) 05/22/2014    Past Surgical History:  Procedure Laterality Date  . BREAST BIOPSY Left 1990   X 2  . CARDIAC CATHETERIZATION  90's   [facility]; no stent  . CATARACT EXTRACTION W/PHACO Right 02/04/2016   Procedure: CATARACT EXTRACTION PHACO AND INTRAOCULAR LENS PLACEMENT (IOC);  Surgeon: Birder Robson, MD;  Location: [facility] ORS;  Service: Ophthalmology;  Laterality: Right;  [place] 00:44 AP% 19.2 CDE 8.49 fluid pack lot # [id number]  . colonoscopy    . ENDOMETRIAL BIOPSY    . MASTECTOMY Right 1985    Family History  Problem Relation Age of Onset  . Uterine cancer Mother   . Lung cancer Mother   . Stroke Mother   . Hypertension Mother   . Heart disease Father   . Arrhythmia Father     afib  . Cervical cancer Sister   . Breast cancer Paternal Aunt 17    Social History   Social History  . Marital status: Married    Spouse name: N/A  . Number of children: N/A  . Years of education: N/A   Occupational History  . Not on file.   Social History Main Topics  . Smoking status: Former Smoker    Packs/day: 0.25    Years: 10.00    Types: Cigarettes  . Smokeless tobacco: Never Used  . Alcohol use No  . Drug  use: No  . Sexual activity: Yes   Other Topics Concern  . Not on file   Social History Narrative  . No narrative on file     Current Outpatient Prescriptions:  .  Ascorbic Acid (VITAMIN C) POWD, Take 5,000 packets by mouth daily., Disp: , Rfl:  .  Creatine POWD, 5 packets by Does not apply route daily., Disp: , Rfl:  .  Lysine 1000 MG TABS, Take 1,000 mg by mouth., Disp: , Rfl:  .  Methylsulfonylmethane (MSM) POWD, Take 10,000 packets by mouth., Disp: , Rfl:  .  Cholecalciferol (VITAMIN D) 2000 UNITS tablet, Take 2,000 Units by mouth daily., Disp: , Rfl:  .  diclofenac (CATAFLAM) 50 MG tablet, Take 50 mg by mouth., Disp: , Rfl:  .  Ginger, Zingiber  officinalis, (GINGER ROOT) 500 MG CAPS, Take 500 mg by mouth daily., Disp: , Rfl:  .  levothyroxine (SYNTHROID, LEVOTHROID) 75 MCG tablet, TAKE 1 TABLET DAILY., Disp: 90 tablet, Rfl: 0 .  methotrexate 250 MG/10ML injection, Inject 250 mg into the muscle once a week., Disp: , Rfl:  .  naproxen sodium (ANAPROX) 220 MG tablet, Take 220 mg by mouth as needed., Disp: , Rfl:  .  polyethylene glycol powder (GLYCOLAX/MIRALAX) powder, Take 17 g by mouth daily., Disp: 3350 g, Rfl: 1 .  tiZANidine (ZANAFLEX) 2 MG tablet, Take 1 tablet (2 mg total) by mouth at bedtime. (Patient taking differently: Take 2 mg by mouth at bedtime. As needed), Disp: 30 tablet, Rfl: 1  Allergies  Allergen Reactions  . Sulfa Antibiotics      ROS  Constitutional: Negative for fever or weight change.  Respiratory: Negative for cough and shortness of breath.   Cardiovascular: Negative for chest pain or palpitations.  Gastrointestinal: Negative for abdominal pain, no bowel changes.  Musculoskeletal: Negative for gait problem or joint swelling.  Skin:Psoriasis for rash. Mostly neck and knee Neurological: Negative for dizziness or headache.  No other specific complaints in a complete review of systems (except as listed in HPI above).  Objective  Vitals:   05/30/16 0808  BP: 126/74  Pulse: 100  Resp: 18  Temp: 98.6 F (37 C)  Weight: 188 lb 6 oz (85.4 kg)  Height: 5\' 7"  (1.702 m)    Body mass index is 29.5 kg/m.  Physical Exam  Constitutional: Patient appears well-developed and well-nourished. Obese No distress.  HEENT: head atraumatic, normocephalic, pupils equal and reactive to light, neck supple, throat within normal limits Cardiovascular: Normal rate, regular rhythm and normal heart sounds.  No murmur heard. No BLE edema. Pulmonary/Chest: Effort normal and breath sounds normal. No respiratory distress. Abdominal: Soft.  There is no tenderness. Psychiatric: Patient has a normal mood and affect. behavior is  normal. Judgment and thought content normal. Skin: psoriatic plaque on left knee  PHQ2/9: Depression screen [facility] 2/9 11/30/2015 09/14/2015  Decreased Interest 0 0  Down, Depressed, Hopeless 0 0  PHQ - 2 Score 0 0     Fall Risk: Fall Risk  11/30/2015 09/14/2015  Falls in the past year? No No    Assessment & Plan  1. Acquired hypothyroidism  - TSH  2. 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 Jefm Bryant  3. Vitamin D deficiency  Continue supplementation   4. Overweight  Discussed with the patient the risk posed by an increased BMI. Discussed importance of portion control, calorie counting and at least 150 minutes of physical activity weekly. Avoid sweet beverages and drink more water.  Eat at least 6 servings of fruit and vegetables daily   5. Muscle spasms of neck  Continue prn   6. Chronic constipation  - polyethylene glycol powder (GLYCOLAX/MIRALAX) powder; Take 17 g by mouth daily.  Dispense: 3350 g; Refill: 1  7. Encounter for long-term (current) use of medications  - COMPLETE METABOLIC PANEL WITH GFR

## 2016-05-30 NOTE — Telephone Encounter (Signed)
Gastroenterology Pre-Procedure Review  Request Date: 07/15/2016 Requesting Physician:  PATIENT REVIEW QUESTIONS: The patient responded to the following health history questions as indicated:    1. Are you having any GI issues? yes (Constipation ) 2. Do you have a personal history of Polyps? no 3. Do you have a family history of Colon Cancer or Polyps? no 4. Diabetes Mellitus? no 5. Joint replacements in the past 12 months?no 6. Major health problems in the past 3 months?no 7. Any artificial heart valves, MVP, or defibrillator?no    MEDICATIONS & ALLERGIES:    Patient reports the following regarding taking any anticoagulation/antiplatelet therapy:   Plavix, Coumadin, Eliquis, Xarelto, Lovenox, Pradaxa, Brilinta, or Effient? no Aspirin? no  Patient confirms/reports the following medications:  Current Outpatient Prescriptions  Medication Sig Dispense Refill  . Ascorbic Acid (VITAMIN C) POWD Take 5,000 packets by mouth daily.    . Cholecalciferol (VITAMIN D) 2000 UNITS tablet Take 2,000 Units by mouth daily.    . Creatine POWD 5 packets by Does not apply route daily.    . diclofenac (CATAFLAM) 50 MG tablet Take 50 mg by mouth.    . levothyroxine (SYNTHROID, LEVOTHROID) 75 MCG tablet TAKE 1 TABLET DAILY. 90 tablet 0  . Lysine 1000 MG TABS Take 1,000 mg by mouth.    . methotrexate 250 MG/10ML injection Inject 250 mg into the muscle once a week.    . Methylsulfonylmethane (MSM) POWD Take 10,000 packets by mouth.    . naproxen sodium (ANAPROX) 220 MG tablet Take 220 mg by mouth as needed.    . polyethylene glycol powder (GLYCOLAX/MIRALAX) powder Take 17 g by mouth daily. 3350 g 1  . Ginger, Zingiber officinalis, (GINGER ROOT) 500 MG CAPS Take 500 mg by mouth daily.    Marland Kitchen tiZANidine (ZANAFLEX) 2 MG tablet Take 1 tablet (2 mg total) by mouth at bedtime. (Patient not taking: Reported on 05/30/2016) 30 tablet 1   No current facility-administered medications for this visit.     Patient  confirms/reports the following allergies:  Allergies  Allergen Reactions  . Sulfa Antibiotics Rash    No orders of the defined types were placed in this encounter.   AUTHORIZATION INFORMATION Primary Insurance: 1D#: Group #:  Secondary Insurance: 1D#: Group #:  SCHEDULE INFORMATION: Date: 07/15/2016 Time: Location: [facility]

## 2016-05-30 NOTE — Telephone Encounter (Signed)
Please verify previous colonoscopy appointment.

## 2016-05-31 ENCOUNTER — Other Ambulatory Visit: Payer: Self-pay | Admitting: Family Medicine

## 2016-05-31 MED ORDER — LEVOTHYROXINE SODIUM 75 MCG PO TABS: 75.0000 ug | ORAL_TABLET | Freq: Every day | ORAL | 1 refills | Status: DC

## 2016-07-06 ENCOUNTER — Encounter: Payer: Self-pay | Admitting: *Deleted

## 2016-07-14 NOTE — Discharge Instructions (Signed)

## 2016-07-15 ENCOUNTER — Ambulatory Visit: Payer: Medicare Other | Admitting: Anesthesiology

## 2016-07-15 ENCOUNTER — Encounter
Admission: RE | Disposition: A | Discharge status: Home / Self Care | Payer: Self-pay | Source: Ambulatory Visit | Attending: Gastroenterology

## 2016-07-15 ENCOUNTER — Ambulatory Visit
Admission: RE | Admit: 2016-07-15 | Discharge: 2016-07-15 | Disposition: A | Discharge status: Home / Self Care | Payer: Medicare Other | Source: Ambulatory Visit | Attending: Gastroenterology | Admitting: Gastroenterology

## 2016-07-15 DIAGNOSIS — Z87891 Personal history of nicotine dependence: Secondary | ICD-10-CM | POA: Insufficient documentation

## 2016-07-15 DIAGNOSIS — Z6828 Body mass index (BMI) 28.0-28.9, adult: Secondary | ICD-10-CM | POA: Insufficient documentation

## 2016-07-15 DIAGNOSIS — Z1211 Encounter for screening for malignant neoplasm of colon: Secondary | ICD-10-CM | POA: Diagnosis present

## 2016-07-15 DIAGNOSIS — Z79899 Other long term (current) drug therapy: Secondary | ICD-10-CM | POA: Insufficient documentation

## 2016-07-15 DIAGNOSIS — Z853 Personal history of malignant neoplasm of breast: Secondary | ICD-10-CM | POA: Insufficient documentation

## 2016-07-15 DIAGNOSIS — L4050 Arthropathic psoriasis, unspecified: Secondary | ICD-10-CM | POA: Insufficient documentation

## 2016-07-15 DIAGNOSIS — Z9841 Cataract extraction status, right eye: Secondary | ICD-10-CM | POA: Diagnosis not present

## 2016-07-15 DIAGNOSIS — G4700 Insomnia, unspecified: Secondary | ICD-10-CM | POA: Insufficient documentation

## 2016-07-15 DIAGNOSIS — D123 Benign neoplasm of transverse colon: Secondary | ICD-10-CM

## 2016-07-15 DIAGNOSIS — E669 Obesity, unspecified: Secondary | ICD-10-CM | POA: Insufficient documentation

## 2016-07-15 DIAGNOSIS — K210 Gastro-esophageal reflux disease with esophagitis: Secondary | ICD-10-CM | POA: Insufficient documentation

## 2016-07-15 DIAGNOSIS — K641 Second degree hemorrhoids: Secondary | ICD-10-CM | POA: Insufficient documentation

## 2016-07-15 DIAGNOSIS — E039 Hypothyroidism, unspecified: Secondary | ICD-10-CM | POA: Diagnosis not present

## 2016-07-15 HISTORY — PX: POLYPECTOMY: SHX5525

## 2016-07-15 HISTORY — PX: COLONOSCOPY WITH PROPOFOL: SHX5780

## 2016-07-15 HISTORY — DX: Other specified postprocedural states: Z98.890

## 2016-07-15 HISTORY — DX: Gastro-esophageal reflux disease without esophagitis: K21.9

## 2016-07-15 HISTORY — DX: Nausea with vomiting, unspecified: R11.2

## 2016-07-15 SURGERY — COLONOSCOPY WITH PROPOFOL
Anesthesia: Monitor Anesthesia Care | Site: Rectum | Wound class: Contaminated

## 2016-07-15 MED ORDER — LIDOCAINE HCL (CARDIAC) 20 MG/ML IV SOLN
INTRAVENOUS | Status: DC | PRN
  Administered 2016-07-15: 50 mg via INTRAVENOUS

## 2016-07-15 MED ORDER — PROPOFOL 10 MG/ML IV BOLUS
INTRAVENOUS | Status: DC | PRN
  Administered 2016-07-15 (×4): 50 mg via INTRAVENOUS

## 2016-07-15 MED ORDER — LACTATED RINGERS IV SOLN
INTRAVENOUS | Status: DC
  Administered 2016-07-15: 08:00:00 via INTRAVENOUS

## 2016-07-15 MED ORDER — STERILE WATER FOR IRRIGATION IR SOLN
Status: DC | PRN
  Administered 2016-07-15: 09:00:00

## 2016-07-15 SURGICAL SUPPLY — 23 items
CANISTER SUCT 1200ML W/VALVE (MISCELLANEOUS) ×3 IMPLANT
CLIP HMST 235XBRD CATH ROT (MISCELLANEOUS) IMPLANT
CLIP RESOLUTION 360 11X235 (MISCELLANEOUS)
FCP ESCP3.2XJMB 240X2.8X (MISCELLANEOUS)
FORCEPS BIOP RAD 4 LRG CAP 4 (CUTTING FORCEPS) ×3 IMPLANT
FORCEPS BIOP RJ4 240 W/NDL (MISCELLANEOUS)
FORCEPS ESCP3.2XJMB 240X2.8X (MISCELLANEOUS) IMPLANT
GOWN CVR UNV OPN BCK APRN NK (MISCELLANEOUS) ×2 IMPLANT
GOWN ISOL THUMB LOOP REG UNIV (MISCELLANEOUS) ×4
INJECTOR VARIJECT VIN23 (MISCELLANEOUS) IMPLANT
KIT DEFENDO VALVE AND CONN (KITS) ×3 IMPLANT
KIT ENDO PROCEDURE OLY (KITS) ×3 IMPLANT
MARKER SPOT ENDO TATTOO 5ML (MISCELLANEOUS) IMPLANT
PAD GROUND ADULT SPLIT (MISCELLANEOUS) IMPLANT
PROBE APC STR FIRE (PROBE) IMPLANT
RETRIEVER NET ROTH 2.5X230 LF (MISCELLANEOUS) IMPLANT
SNARE SHORT THROW 13M SML OVAL (MISCELLANEOUS) IMPLANT
SNARE SHORT THROW 30M LRG OVAL (MISCELLANEOUS) IMPLANT
SNARE SNG USE RND 15MM (INSTRUMENTS) IMPLANT
SPOT EX ENDOSCOPIC TATTOO (MISCELLANEOUS)
TRAP ETRAP POLY (MISCELLANEOUS) IMPLANT
VARIJECT INJECTOR VIN23 (MISCELLANEOUS)
WATER STERILE IRR 250ML POUR (IV SOLUTION) ×3 IMPLANT

## 2016-07-15 NOTE — Op Note (Signed)
[facility] Gastroenterology Patient Name: Nicole Contreras Procedure Date: 07/15/2016 9:06 AM MRN: [id number] Account #: [id number] Date of Birth: [birth year] Admit Type: Outpatient Age: 70 Room: [facility] OR ROOM 01 Gender: Female Note Status: Finalized Procedure:            Colonoscopy Indications:          Screening for colorectal malignant neoplasm Providers:            [name] MD, MD Referring MD:         Bethena Roys. Sowles, MD (Referring MD) Medicines:            Propofol per Anesthesia Complications:        No immediate complications. Procedure:            Pre-Anesthesia Assessment:                       - Prior to the procedure, a History and Physical was                        performed, and patient medications and allergies were                        reviewed. The patient's tolerance of previous                        anesthesia was also reviewed. The risks and benefits of                        the procedure and the sedation options and risks were                        discussed with the patient. All questions were                        answered, and informed consent was obtained. Prior                        Anticoagulants: The patient has taken no previous                        anticoagulant or antiplatelet agents. ASA Grade                        Assessment: II - A patient with mild systemic disease.                        After reviewing the risks and benefits, the patient was                        deemed in satisfactory condition to undergo the                        procedure.                       After obtaining informed consent, the colonoscope was                        passed under direct vision. Throughout the procedure,  the patient's blood pressure, pulse, and oxygen                        saturations were monitored continuously. The was                        introduced through the anus and advanced to the the                         cecum, identified by appendiceal orifice and ileocecal                        valve. The colonoscopy was performed without                        difficulty. The patient tolerated the procedure well.                        The quality of the bowel preparation was excellent. Findings:      The perianal and digital rectal examinations were normal.      A 3 mm polyp was found in the transverse colon. The polyp was sessile.       The polyp was removed with a cold biopsy forceps. Resection and       retrieval were complete.      Non-bleeding internal hemorrhoids were found during retroflexion. The       hemorrhoids were Grade II (internal hemorrhoids that prolapse but reduce       spontaneously). Impression:           - One 3 mm polyp in the transverse colon, removed with                        a cold biopsy forceps. Resected and retrieved.                       - Non-bleeding internal hemorrhoids. Recommendation:       - Await pathology results.                       - Discharge patient to home.                       - Resume previous diet.                       - Continue present medications.                       - Repeat colonoscopy in 5 years if polyp adenoma and 10                        years if hyperplastic Procedure Code(s):    --- Professional ---                       [phone], Colonoscopy, flexible; with biopsy, single or                        multiple Diagnosis Code(s):    --- Professional ---  Z12.11, Encounter for screening for malignant neoplasm                        of colon                       D12.3, Benign neoplasm of transverse colon (hepatic                        flexure or splenic flexure) CPT copyright 2016 American Medical Association. All rights reserved. The codes documented in this report are preliminary and upon coder review may  be revised to meet current compliance requirements. [name] MD, MD 07/15/2016 9:31:40  AM This report has been signed electronically. Number of Addenda: 0 Note Initiated On: 07/15/2016 9:06 AM Scope Withdrawal Time: 0 hours 7 minutes 2 seconds  Total Procedure Duration: 0 hours 12 minutes 7 seconds       [facility]

## 2016-07-15 NOTE — Anesthesia Preprocedure Evaluation (Signed)
Anesthesia Evaluation  Patient identified by MRN, date of birth, ID band Patient awake    Reviewed: Allergy & Precautions, NPO status , Patient's Chart, lab work & pertinent test results  Airway Mallampati: III  TM Distance: <3 FB Neck ROM: Full    Dental  (+) Caps   Pulmonary former smoker,    Pulmonary exam normal        Cardiovascular Normal cardiovascular exam+ Valvular Problems/Murmurs      Neuro/Psych negative psych ROS   GI/Hepatic Neg liver ROS, GERD  Medicated and Controlled,  Endo/Other  Hypothyroidism   Renal/GU Renal diseaseBrights DZ     Musculoskeletal Muscle spasms of neck   Abdominal Normal abdominal exam  (+)   Peds  Hematology negative hematology ROS (+)   Anesthesia Other Findings   Reproductive/Obstetrics                             Anesthesia Physical Anesthesia Plan  ASA: II  Anesthesia Plan: MAC   Post-op Pain Management:    Induction: Intravenous  Airway Management Planned: Natural Airway  Additional Equipment:   Intra-op Plan:   Post-operative Plan:   Informed Consent: I have reviewed the patients History and Physical, chart, labs and discussed the procedure including the risks, benefits and alternatives for the proposed anesthesia with the patient or authorized representative who has indicated his/her understanding and acceptance.     Plan Discussed with: CRNA  Anesthesia Plan Comments:         Anesthesia Quick Evaluation

## 2016-07-15 NOTE — H&P (Signed)
Nicole Lame, MD [address] [place], [place] [address] Phone: [phone] Fax : [phone]  Primary Care Physician:  Loistine Chance, MD Primary Gastroenterologist:  Dr. Allen Norris  Pre-Procedure History & Physical: HPI:  Nicole Contreras is a 70 y.o. female is here for a screening colonoscopy.   Past Medical History:  Diagnosis Date  . Breast cancer ([place])   . Bright's disease    Hx  . Complication of anesthesia    PONV after cataract procedure 3/17  . Dizziness and giddiness   . Fecal smearing   . GERD (gastroesophageal reflux disease)   . Heart murmur    as child  . History of breast cancer   . Insomnia, unspecified   . Obesity, unspecified   . PONV (postoperative nausea and vomiting)    after cataract procedure  . Postmenopausal bleeding   . 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 Reflux esophagitis   . Spasm of muscle   . Unspecified hypothyroidism   . Unspecified urinary incontinence   . Unspecified vitamin D deficiency     Past Surgical History:  Procedure Laterality Date  . BREAST BIOPSY Left 1990   X 2  . CARDIAC CATHETERIZATION  90's   [facility]; no stent  . CATARACT EXTRACTION W/PHACO Right 02/04/2016   Procedure: CATARACT EXTRACTION PHACO AND INTRAOCULAR LENS PLACEMENT (IOC);  Surgeon: Birder Robson, MD;  Location: [facility] ORS;  Service: Ophthalmology;  Laterality: Right;  [place] 00:44 AP% 19.2 CDE 8.49 fluid pack lot # [id number]  . colonoscopy    . ENDOMETRIAL BIOPSY    . MASTECTOMY Right 1985    Prior to Admission medications   Medication Sig Start Date End Date Taking? Authorizing Provider  Ascorbic Acid (VITAMIN C) POWD Take 5,000 packets by mouth daily.   Yes Historical Provider, MD  Cholecalciferol (VITAMIN D) 2000 UNITS tablet Take 2,000 Units by mouth daily.   Yes Historical Provider, MD  Creatine POWD 5 packets by Does not apply route daily.   Yes Historical Provider, MD  diclofenac (CATAFLAM) 50 MG tablet Take 50 mg by mouth.   Yes  Historical Provider, MD  Ginger, Zingiber officinalis, (GINGER ROOT) 500 MG CAPS Take 500 mg by mouth daily.   Yes Historical Provider, MD  levothyroxine (SYNTHROID, LEVOTHROID) 75 MCG tablet Take 1 tablet (75 mcg total) by mouth daily. 05/31/16  Yes Steele Sizer, MD  Lysine 1000 MG TABS Take 1,000 mg by mouth.   Yes Historical Provider, MD  methotrexate 250 MG/10ML injection Inject 250 mg into the muscle once a week. 08/18/15  Yes Historical Provider, MD  Methylsulfonylmethane (MSM) POWD Take 10,000 packets by mouth.   Yes Historical Provider, MD  naproxen sodium (ANAPROX) 220 MG tablet Take 220 mg by mouth as needed.   Yes Historical Provider, MD  polyethylene glycol powder (GLYCOLAX/MIRALAX) powder Take 17 g by mouth daily. 05/30/16   Steele Sizer, MD  tiZANidine (ZANAFLEX) 2 MG tablet Take 1 tablet (2 mg total) by mouth at bedtime. Patient not taking: Reported on 05/30/2016 11/30/15   Steele Sizer, MD    Allergies as of 05/30/2016 - Review Complete 05/30/2016  Allergen Reaction Noted  . Sulfa antibiotics Rash 02/01/2016    Family History  Problem Relation Age of Onset  . Uterine cancer Mother   . Lung cancer Mother   . Stroke Mother   . Hypertension Mother   . Heart disease Father   . Arrhythmia Father     afib  . Cervical cancer Sister   .  Breast cancer Paternal Aunt 24    Social History   Social History  . Marital status: Married    Spouse name: N/A  . Number of children: N/A  . Years of education: N/A   Occupational History  . Not on file.   Social History Main Topics  . Smoking status: Former Smoker    Packs/day: 0.25    Years: 10.00    Types: Cigarettes    Quit date: 11/07/1990  . Smokeless tobacco: Never Used  . Alcohol use No  . Drug use: No  . Sexual activity: Yes   Other Topics Concern  . Not on file   Social History Narrative  . No narrative on file    Review of Systems: See HPI, otherwise negative ROS  Physical Exam: BP 113/80   Pulse 86    Temp 97.1 F (36.2 C) (Temporal)   Ht 5\' 7"  (1.702 m)   Wt 183 lb (83 kg)   SpO2 96%   BMI 28.66 kg/m  General:   Alert,  pleasant and cooperative in NAD Head:  Normocephalic and atraumatic. Neck:  Supple; no masses or thyromegaly. Lungs:  Clear throughout to auscultation.    Heart:  Regular rate and rhythm. Abdomen:  Soft, nontender and nondistended. Normal bowel sounds, without guarding, and without rebound.   Neurologic:  Alert and  oriented x4;  grossly normal neurologically.  Impression/Plan: SCOTLYN GOTSHALL is now here to undergo a screening colonoscopy.  Risks, benefits, and alternatives regarding colonoscopy have been reviewed with the patient.  Questions have been answered.  All parties agreeable.

## 2016-07-15 NOTE — Transfer of Care (Signed)
Immediate Anesthesia Transfer of Care Note  Patient: Nicole Contreras  Procedure(s) Performed: Procedure(s): COLONOSCOPY WITH PROPOFOL (N/A) POLYPECTOMY (N/A)  Patient Location: PACU  Anesthesia Type: MAC  Level of Consciousness: awake, alert  and patient cooperative  Airway and Oxygen Therapy: Patient Spontanous Breathing and Patient connected to supplemental oxygen  Post-op Assessment: Post-op Vital signs reviewed, Patient's Cardiovascular Status Stable, Respiratory Function Stable, Patent Airway and No signs of Nausea or vomiting  Post-op Vital Signs: Reviewed and stable  Complications: No apparent anesthesia complications

## 2016-07-15 NOTE — Anesthesia Postprocedure Evaluation (Signed)
Anesthesia Post Note  Patient: Nicole Contreras  Procedure(s) Performed: Procedure(s) (LRB): COLONOSCOPY WITH PROPOFOL (N/A) POLYPECTOMY (N/A)  Patient location during evaluation: PACU Anesthesia Type: MAC Level of consciousness: awake and alert Pain management: pain level controlled Vital Signs Assessment: post-procedure vital signs reviewed and stable Respiratory status: spontaneous breathing, nonlabored ventilation, respiratory function stable and patient connected to nasal cannula oxygen Cardiovascular status: stable and blood pressure returned to baseline Anesthetic complications: no    Marshell Levan

## 2016-07-15 NOTE — Anesthesia Procedure Notes (Signed)
Procedure Name: MAC Performed by: Lamin Chandley Pre-anesthesia Checklist: Patient identified, Emergency Drugs available, Suction available, Timeout performed and Patient being monitored Patient Re-evaluated:Patient Re-evaluated prior to inductionOxygen Delivery Method: Nasal cannula Placement Confirmation: positive ETCO2     

## 2016-07-18 ENCOUNTER — Encounter: Payer: Self-pay | Admitting: Gastroenterology

## 2016-07-19 ENCOUNTER — Encounter: Payer: Self-pay | Admitting: Gastroenterology

## 2016-08-19 ENCOUNTER — Encounter: Payer: Self-pay | Admitting: Family Medicine

## 2016-09-02 ENCOUNTER — Ambulatory Visit (INDEPENDENT_AMBULATORY_CARE_PROVIDER_SITE_OTHER): Payer: Medicare Other | Admitting: Nurse Practitioner

## 2016-09-02 ENCOUNTER — Encounter: Payer: Self-pay | Admitting: Nurse Practitioner

## 2016-09-02 VITALS — BP 142/72 | HR 93 | Ht 67.0 in | Wt 186.5 lb

## 2016-09-02 DIAGNOSIS — R002 Palpitations: Secondary | ICD-10-CM

## 2016-09-02 DIAGNOSIS — R079 Chest pain, unspecified: Secondary | ICD-10-CM | POA: Diagnosis not present

## 2016-09-02 NOTE — Patient Instructions (Addendum)
Medication Instructions:  Your physician recommends that you continue on your current medications as directed. Please refer to the Current Medication list given to you today.   Labwork: BMET, TSH, mag  Testing/Procedures: Your physician has requested that you have a lexiscan myoview. For further information please visit [email or web address]. Please follow instruction sheet, as given.  [place]  Your caregiver has ordered a Stress Test with nuclear imaging. The purpose of this test is to evaluate the blood supply to your heart muscle. This procedure is referred to as a "Non-Invasive Stress Test." This is because other than having an IV started in your vein, nothing is inserted or "invades" your body. Cardiac stress tests are done to find areas of poor blood flow to the heart by determining the extent of coronary artery disease (CAD). Some patients exercise on a treadmill, which naturally increases the blood flow to your heart, while others who are  unable to walk on a treadmill due to physical limitations have a pharmacologic/chemical stress agent called Lexiscan . This medicine will mimic walking on a treadmill by temporarily increasing your coronary blood flow.   Please note: these test may take anywhere between 2-4 hours to complete  PLEASE REPORT TO [facility] AT THE FIRST DESK WILL DIRECT YOU WHERE TO GO  Date of Procedure:_Friday, November 3__  Arrival Time for Procedure:_7:15am__  Instructions regarding medication:  You may take your morning medications with a sip of water.  PLEASE NOTIFY THE OFFICE AT LEAST 27 HOURS IN ADVANCE IF YOU ARE UNABLE TO KEEP YOUR APPOINTMENT.  [phone] AND  PLEASE NOTIFY NUCLEAR MEDICINE AT [facility] AT LEAST 24 HOURS IN ADVANCE IF YOU ARE UNABLE TO KEEP YOUR APPOINTMENT. [phone]  How to prepare for your Myoview test:  1. Do not eat or drink after midnight 2. No caffeine for 24 hours prior to test 3. No  smoking 24 hours prior to test. 4. Your medication may be taken with water.  If your doctor stopped a medication because of this test, do not take that medication. 5. Ladies, please do not wear dresses.  Skirts or pants are appropriate. Please wear a short sleeve shirt. 6. No perfume, cologne or lotion. 7. Wear comfortable walking shoes. No heels!          Your physician has recommended that you wear an event monitor. Event monitors are medical devices that record the heart's electrical activity. Doctors most often [place] these monitors to diagnose arrhythmias. Arrhythmias are problems with the speed or rhythm of the heartbeat. The monitor is a small, portable device. You can wear one while you do your normal daily activities. This is usually used to diagnose what is causing palpitations/syncope (passing out).    Follow-Up: Your physician recommends that you schedule a follow-up appointment in: 6 weeks with Ignacia Bayley, NP or Dr. Fletcher Anon.    Any Other Special Instructions Will Be Listed Below (If Applicable).     If you need a refill on your cardiac medications before your next appointment, please call your pharmacy.   Cardiac Event Monitoring A cardiac event monitor is a small recording device used to help detect abnormal heart rhythms (arrhythmias). The monitor is used to record heart rhythm when noticeable symptoms such as the following occur:  Fast heartbeats (palpitations), such as heart racing or fluttering.  Dizziness.  Fainting or light-headedness.  Unexplained weakness. The monitor is wired to two electrodes placed on your chest. Electrodes are flat, sticky disks that attach  to your skin. The monitor can be worn for up to 30 days. You will wear the monitor at all times, except when bathing.  HOW TO USE YOUR CARDIAC EVENT MONITOR A technician will prepare your chest for the electrode placement. The technician will show you how to place the electrodes, how to work the monitor,  and how to replace the batteries. Take time to practice using the monitor before you leave the office. Make sure you understand how to send the information from the monitor to your health care provider. This requires a telephone with a landline, not a cell phone. You need to:  Wear your monitor at all times, except when you are in water:  Do not get the monitor wet.  Take the monitor off when bathing. Do not swim or use a hot tub with it on.  Keep your skin clean. Do not put body lotion or moisturizer on your chest.  Change the electrodes daily or any time they stop sticking to your skin. You might need to use tape to keep them on.  It is possible that your skin under the electrodes could become irritated. To keep this from happening, try to put the electrodes in slightly different places on your chest. However, they must remain in the area under your left breast and in the upper right section of your chest.  Make sure the monitor is safely clipped to your clothing or in a location close to your body that your health care provider recommends.  Press the button to record when you feel symptoms of heart trouble, such as dizziness, weakness, light-headedness, palpitations, thumping, shortness of breath, unexplained weakness, or a fluttering or racing heart. The monitor is always on and records what happened slightly before you pressed the button, so do not worry about being too late to get good information.  Keep a diary of your activities, such as walking, doing chores, and taking medicine. It is especially important to note what you were doing when you pushed the button to record your symptoms. This will help your health care provider determine what might be contributing to your symptoms. The information stored in your monitor will be reviewed by your health care provider alongside your diary entries.  Send the recorded information as recommended by your health care provider. It is important to  understand that it will take some time for your health care provider to process the results.  Change the batteries as recommended by your health care provider. SEEK IMMEDIATE MEDICAL CARE IF:   You have chest pain.  You have extreme difficulty breathing or shortness of breath.  You develop a very fast heartbeat that persists.  You develop dizziness that does not go away.  You faint or constantly feel you are about to faint.   This information is not intended to replace advice given to you by your health care provider. Make sure you discuss any questions you have with your health care provider.   Document Released: 08/02/2008 Document Revised: 11/14/2014 Document Reviewed: 04/22/2013 Elsevier Interactive Patient Education 2016 [employer]. Cardiac Nuclear Scanning A cardiac nuclear scan is used to check your heart for problems, such as the following:  A portion of the heart is not getting enough blood.  Part of the heart muscle has died, which happens with a heart attack.  The heart wall is not working normally.  In this test, a radioactive dye (tracer) is injected into your bloodstream. After the tracer has traveled to your heart,  a scanning device is used to measure how much of the tracer is absorbed by or distributed to various areas of your heart. LET [facility] CARE PROVIDER KNOW ABOUT:  Any allergies you have.  All medicines you are taking, including vitamins, herbs, eye drops, creams, and over-the-counter medicines.  Previous problems you or members of your family have had with the use of anesthetics.  Any blood disorders you have.  Previous surgeries you have had.  Medical conditions you have.  RISKS AND COMPLICATIONS Generally, this is a safe procedure. However, as with any procedure, problems can occur. Possible problems include:   Serious chest pain.  Rapid heartbeat.  Sensation of warmth in your chest. This usually passes quickly. BEFORE THE  PROCEDURE Ask your health care provider about changing or stopping your regular medicines. PROCEDURE This procedure is usually done at a hospital and takes 2-4 hours.  An IV tube is inserted into one of your veins.  Your health care provider will inject a small amount of radioactive tracer through the tube.  You will then wait for 20-40 minutes while the tracer travels through your bloodstream.  You will lie down on an exam table so images of your heart can be taken. Images will be taken for about 15-20 minutes.  You will exercise on a treadmill or stationary bike. While you exercise, your heart activity will be monitored with an electrocardiogram (ECG), and your blood pressure will be checked.  If you are unable to exercise, you may be given a medicine to make your heart beat faster.  When blood flow to your heart has peaked, tracer will again be injected through the IV tube.  After 20-40 minutes, you will get back on the exam table and have more images taken of your heart.  When the procedure is over, your IV tube will be removed. AFTER THE PROCEDURE  You will likely be able to leave shortly after the test. Unless your health care provider tells you otherwise, you may return to your normal schedule, including diet, activities, and medicines.  Make sure you find out how and when you will get your test results.   This information is not intended to replace advice given to you by your health care provider. Make sure you discuss any questions you have with your health care provider.   Document Released: 11/18/2004 Document Revised: 10/29/2013 Document Reviewed: 10/02/2013 Elsevier Interactive Patient Education [employer].

## 2016-09-02 NOTE — Progress Notes (Signed)
Office Visit    Patient Name: Nicole Contreras Date of Encounter: 09/02/2016  Primary Care Provider:  Loistine Chance, MD Primary Cardiologist:  Jerilynn Mages. Fletcher Anon, MD   Chief Complaint    70 y/o ? with a h/o atypical c/p with nl ETT in 03/2014, who presents for f/u related to an episode of chest pain and palpitations.  Past Medical History    Past Medical History:  Diagnosis Date  . Atypical chest pain    a. 03/2014 ETT: Ex time 4:10, Max HR 160 bpm, no acute st/t changes.  . Breast cancer ([place])    a. 1986 s/p radiation.  . Bright's disease    Hx  . Diastolic dysfunction    a. 02/2014 Echo: EF 55-60%, no rwma, Gr1 DD.  . Dizziness and giddiness   . Fecal smearing   . GERD (gastroesophageal reflux disease)   . Heart murmur    as child  . Insomnia, unspecified   . Obesity, unspecified   . PONV (postoperative nausea and vomiting)    after cataract procedure  . Postmenopausal bleeding   . 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 Reflux esophagitis   . Right cataract    a. 01/2016 s/p cataract surgery.  Marland Kitchen Spasm of muscle   . Unspecified hypothyroidism   . Unspecified urinary incontinence   . Unspecified vitamin D deficiency    Past Surgical History:  Procedure Laterality Date  . BREAST BIOPSY Left 1990   X 2  . CARDIAC CATHETERIZATION  90's   [facility]; no stent  . CATARACT EXTRACTION W/PHACO Right 02/04/2016   Procedure: CATARACT EXTRACTION PHACO AND INTRAOCULAR LENS PLACEMENT (IOC);  Surgeon: Birder Robson, MD;  Location: [facility] ORS;  Service: Ophthalmology;  Laterality: Right;  [place] 00:44 AP% 19.2 CDE 8.49 fluid pack lot # [id number] H  . colonoscopy    . COLONOSCOPY WITH PROPOFOL N/A 07/15/2016   Procedure: COLONOSCOPY WITH PROPOFOL;  Surgeon: [name], MD;  Location: [place] [facility];  Service: Endoscopy;  Laterality: N/A;  . ENDOMETRIAL BIOPSY    . MASTECTOMY Right 1985  . POLYPECTOMY N/A 07/15/2016   Procedure: POLYPECTOMY;  Surgeon: [name], MD;  Location: [facility];  Service: Endoscopy;  Laterality: N/A;    Allergies  Allergies  Allergen Reactions  . Sulfa Antibiotics Rash    History of Present Illness    70 y/o ? with a h/o breast CA s/p radiation in 1986, hypothyroidism, GERD, obesity, and atypical c/p.  She was last seen by Dr. Fletcher Anon in early 2015 for c/p.  ETT was performed and was nl, though exercise tolerance was poor.  Echo was also performed and showed nl EF with grade 1 diastolic dysfxn. She has been reasonably well since her last visit in 2015, however beginning a few weeks ago, she began to note the intermittent sensation of an irregular heartbeat. Heart rate was not necessarily elevated. She says that she would feel tired and feel as though she had been running a race and when she would check her pulse it would sometimes skipped beats. Her blood pressure cuff has also been intermittently recording an irregular heartbeat.  The last episode of this was about 10 days ago.    She also reports a recent episode of chest discomfort that awoke her from sleep. This was substernal in nature and without associated symptoms. It did not seem to get any worse or better with position changes. It lasts about 10 minutes and then resolve spontaneously. She was  able to fall back to sleep and this has not recurred. In the interim, she is continue to walk readily with her husband without recurrent chest pain or dyspnea. She notes that during her walks, she has never noticed any change in excess tolerance and also has not had palpitations during those times. She denies any history of PND, orthopnea, dizziness, syncope, edema, or early satiety.  Home Medications    Prior to Admission medications   Medication Sig Start Date End Date Taking? Authorizing Provider  Ascorbic Acid (VITAMIN C) POWD Take 5,000 packets by mouth daily.    Historical Provider, MD  Cholecalciferol (VITAMIN D) 2000 UNITS tablet Take 2,000 Units by mouth daily.    Historical Provider, MD    Creatine POWD 5 packets by Does not apply route daily.    Historical Provider, MD  diclofenac (CATAFLAM) 50 MG tablet Take 50 mg by mouth.    Historical Provider, MD  Ginger, Zingiber officinalis, (GINGER ROOT) 500 MG CAPS Take 500 mg by mouth daily.    Historical Provider, MD  levothyroxine (SYNTHROID, LEVOTHROID) 75 MCG tablet Take 1 tablet (75 mcg total) by mouth daily. 05/31/16   Steele Sizer, MD  Lysine 1000 MG TABS Take 1,000 mg by mouth.    Historical Provider, MD  methotrexate 250 MG/10ML injection Inject 250 mg into the muscle once a week. 08/18/15   Historical Provider, MD  Methylsulfonylmethane (MSM) POWD Take 10,000 packets by mouth.    Historical Provider, MD  naproxen sodium (ANAPROX) 220 MG tablet Take 220 mg by mouth as needed.    Historical Provider, MD  polyethylene glycol powder (GLYCOLAX/MIRALAX) powder Take 17 g by mouth daily. 05/30/16   Steele Sizer, MD  tiZANidine (ZANAFLEX) 2 MG tablet Take 1 tablet (2 mg total) by mouth at bedtime. Patient not taking: Reported on 05/30/2016 11/30/15   Steele Sizer, MD    Review of Systems    Palpitations with irregular heart rhythm and one episode of chest pain as outlined above.  She denies dyspnea, pnd, orthopnea, n, v, dizziness, syncope, edema, weight gain, or early satiety.   All other systems reviewed and are otherwise negative except as noted above.  Physical Exam    VS:  BP (!) 142/72 (BP Location: Left Arm, Patient Position: Sitting, Cuff Size: Normal)   Pulse 93   Ht 5\' 7"  (1.702 m)   Wt 186 lb 8 oz (84.6 kg)   BMI 29.21 kg/m  , BMI Body mass index is 29.21 kg/m. GEN: Well nourished, well developed, in no acute distress.  HEENT: normal.  Neck: Supple, no JVD, carotid bruits, or masses. Cardiac: RRR, soft, insignificant syst murmur @ RUSB, no rubs, or gallops. No clubbing, cyanosis, edema.  Radials/DP/PT 2+ and equal bilaterally.  Respiratory:  Respirations regular and unlabored, clear to auscultation  bilaterally. GI: Soft, nontender, nondistended, BS + x 4. MS: no deformity or atrophy. Skin: warm and dry, no rash. Neuro:  Strength and sensation are intact. Psych: Normal affect.  Accessory Clinical Findings    ECG - RSR, 93, nonspecific ST changes. No acute changes.  Assessment & Plan    1.  Chest pain: Patient had an episode of chest discomfort that awakened her from sleep. This lasted about 10 minutes and resolve spontaneously. She has not had any recurrence of symptoms. She has been active, walking daily without symptoms or limitations. We discussed repeating an exercise treadmill test, however she felt like her exercise tolerance was poor the last time and wasn't sure  that she could walk on the treadmill again. Therefore, we will pursue a Lexiscan Myoview to assess for ischemia.  2. Palpitations/irregular heart rhythm: Over the past few weeks, patient has had intermittent palpitations and sensation of skipped beats and irregular heart rhythm. Irregular heart rhythm was also documented on her home blood pressure cuff. She is in sinus rhythm today. She has not necessarily had any tachycardia. She denies presyncope or syncope. We will check a basic metabolic panel, magnesium, and TSH today. Plan for 30 day event monitor.  Echo in April 2015 showed normal LV function without significant valvular disease and normal biatrial sizes.  3.  HTN:  BP elevated today. She follows this at home and says her systolic is generally less than 110.  She was anxious today b/c they were running late and then went to our old office first.  I rec that she continue to follow her bp @ home for now.  4.  Dispo:  F/u labs, stress test, and event monitor.  F/u in clinic in 4-6 wks.  Murray Hodgkins, NP 09/02/2016, 2:04 PM

## 2016-09-03 LAB — BASIC METABOLIC PANEL
BUN/Creatinine Ratio: 17 (ref 12–28)
BUN: 15 mg/dL (ref 8–27)
CO2: 24 mmol/L (ref 18–29)
CREATININE: 0.86 mg/dL (ref 0.57–1.00)
Calcium: 9.3 mg/dL (ref 8.7–10.3)
Chloride: 103 mmol/L (ref 96–106)
GFR calc Af Amer: 80 mL/min/{1.73_m2} (ref >59)
GFR, EST NON AFRICAN AMERICAN: 70 mL/min/{1.73_m2} (ref >59)
Glucose: 124 mg/dL — ABNORMAL HIGH (ref 65–99)
Potassium: 4.7 mmol/L (ref 3.5–5.2)
SODIUM: 143 mmol/L (ref 134–144)

## 2016-09-03 LAB — TSH: TSH: 1.29 u[IU]/mL (ref 0.450–4.500)

## 2016-09-03 LAB — MAGNESIUM: MAGNESIUM: 1.9 mg/dL (ref 1.6–2.3)

## 2016-09-08 ENCOUNTER — Telehealth: Payer: Self-pay | Admitting: Nurse Practitioner

## 2016-09-08 ENCOUNTER — Ambulatory Visit (INDEPENDENT_AMBULATORY_CARE_PROVIDER_SITE_OTHER): Payer: Medicare Other

## 2016-09-08 DIAGNOSIS — R002 Palpitations: Secondary | ICD-10-CM

## 2016-09-08 NOTE — Telephone Encounter (Signed)
Reviewed myoview instructions w/pt who verbalized understanding.  

## 2016-09-09 ENCOUNTER — Encounter
Admission: RE | Admit: 2016-09-09 | Discharge: 2016-09-09 | Disposition: A | Discharge status: Home / Self Care | Payer: Medicare Other | Source: Ambulatory Visit | Attending: Nurse Practitioner | Admitting: Nurse Practitioner

## 2016-09-09 DIAGNOSIS — R079 Chest pain, unspecified: Secondary | ICD-10-CM | POA: Insufficient documentation

## 2016-09-09 LAB — NM MYOCAR MULTI W/SPECT W/WALL MOTION / EF
CHL CUP NUCLEAR SDS: 0
CHL CUP RESTING HR STRESS: 69 {beats}/min
LVDIAVOL: 62 mL (ref 46–106)
LVSYSVOL: 20 mL
NUC STRESS TID: 1.05
Peak HR: 111 {beats}/min
Percent HR: 73 %
SRS: 4
SSS: 0

## 2016-09-09 MED ORDER — TECHNETIUM TC 99M TETROFOSMIN IV KIT
13.2500 | PACK | Freq: Once | INTRAVENOUS | Status: AC | PRN
  Administered 2016-09-09: 13.25 via INTRAVENOUS

## 2016-09-09 MED ORDER — TECHNETIUM TC 99M TETROFOSMIN IV KIT
31.0700 | PACK | Freq: Once | INTRAVENOUS | Status: AC | PRN
  Administered 2016-09-09: 31.07 via INTRAVENOUS

## 2016-09-09 MED ORDER — REGADENOSON 0.4 MG/5ML IV SOLN
0.4000 mg | Freq: Once | INTRAVENOUS | Status: AC
  Administered 2016-09-09: 0.4 mg via INTRAVENOUS

## 2016-10-14 ENCOUNTER — Ambulatory Visit (INDEPENDENT_AMBULATORY_CARE_PROVIDER_SITE_OTHER): Payer: Medicare Other | Admitting: Cardiovascular Disease

## 2016-10-14 ENCOUNTER — Encounter: Payer: Self-pay | Admitting: Cardiovascular Disease

## 2016-10-14 VITALS — BP 100/62 | HR 79 | Ht 67.0 in | Wt 188.5 lb

## 2016-10-14 DIAGNOSIS — R002 Palpitations: Secondary | ICD-10-CM

## 2016-10-14 DIAGNOSIS — R079 Chest pain, unspecified: Secondary | ICD-10-CM | POA: Diagnosis not present

## 2016-10-14 DIAGNOSIS — Z136 Encounter for screening for cardiovascular disorders: Secondary | ICD-10-CM | POA: Diagnosis not present

## 2016-10-14 NOTE — Patient Instructions (Signed)
Medication Instructions: No changes.   Labwork: None.   Procedures/Testing: None.   Follow-Up: As needed with Dr. Jamielyn Petrucci.   Any Additional Special Instructions Will Be Listed Below (If Applicable).     If you need a refill on your cardiac medications before your next appointment, please call your pharmacy.   

## 2016-10-14 NOTE — Progress Notes (Signed)
Cardiology Office Note   Date:  10/14/2016   ID:  Floye, Schnaidt 09-08-1946, MRN [id number]  PCP:  Loistine Chance, MD  Cardiologist:   Kathlyn Sacramento, MD   Chief Complaint  Patient presents with  . Other    F/u myoview. Meds reviewed verbally with pt.      History of Present Illness: Nicole Contreras is a 70 y.o. female who presents for a follow-up visit regarding chest Pain and palpitations. She was seen by me in 2015 and most recently by Ignacia Bayley. She has known history of hypothyroidism, gastroesophageal reflux disease and history of breast cancer with extensive radiation therapy in 1986. She is retired from [employer]. She is a lifelong nonsmoker with no family history of premature coronary artery disease. Father did have atrial fibrillation and myocardial infarction in his late 28s. She was seen recently for atypical chest pain and recurrent palpitations described as irregular heartbeats and tachycardia. The chest pain woke her up from sleep and she had only one episode. She underwent a pharmacologic nuclear stress test which showed no evidence of ischemia with normal ejection fraction. She had a 30 day outpatient telemetry which showed normal sinus rhythm with no evidence of atrial fibrillation. Occasional PACs and PVCs in a very short run of atrial tachycardia. She reports that she did not have her episodes of palpitations while she was wearing the monitor.  Past Medical History:  Diagnosis Date  . Atypical chest pain    a. 03/2014 ETT: Ex time 4:10, Max HR 160 bpm, no acute st/t changes.  . Breast cancer ([place])    a. 1986 s/p radiation.  . Bright's disease    Hx  . Diastolic dysfunction    a. 02/2014 Echo: EF 55-60%, no rwma, Gr1 DD.  . Dizziness and giddiness   . Fecal smearing   . GERD (gastroesophageal reflux disease)   . Heart murmur    as child  . Insomnia, unspecified   . Obesity, unspecified   . PONV (postoperative  nausea and vomiting)    after cataract procedure  . Postmenopausal bleeding   . 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 Reflux esophagitis   . Right cataract    a. 01/2016 s/p cataract surgery.  Marland Kitchen Spasm of muscle   . Unspecified hypothyroidism   . Unspecified urinary incontinence   . Unspecified vitamin D deficiency     Past Surgical History:  Procedure Laterality Date  . BREAST BIOPSY Left 1990   X 2  . CARDIAC CATHETERIZATION  [facility]   [facility]; no stent  . CATARACT EXTRACTION W/PHACO Right 02/04/2016   Procedure: CATARACT EXTRACTION PHACO AND INTRAOCULAR LENS PLACEMENT (IOC);  Surgeon: Birder Robson, MD;  Location: [facility];  Service: Ophthalmology;  Laterality: Right;  [place] 00:44 AP% 19.2 CDE 8.49 fluid pack lot # [id number]  . colonoscopy    . COLONOSCOPY WITH PROPOFOL N/A 07/15/2016   Procedure: COLONOSCOPY WITH PROPOFOL;  Surgeon: [name], MD;  Location: [place];  Service: Endoscopy;  Laterality: N/A;  . ENDOMETRIAL BIOPSY    . MASTECTOMY Right 1985  . POLYPECTOMY N/A 07/15/2016   Procedure: POLYPECTOMY;  Surgeon: [name], MD;  Location: [place];  Service: Endoscopy;  Laterality: N/A;     Current Outpatient Prescriptions  Medication Sig Dispense Refill  . Ascorbic Acid (VITAMIN C) POWD Take 5,000 packets by mouth daily.    . Cholecalciferol (VITAMIN D) 2000 UNITS tablet Take 2,000 Units by  mouth daily.    . Creatine POWD 5 packets by Does not apply route daily.    . diclofenac (CATAFLAM) 50 MG tablet Take 50 mg by mouth.    . levothyroxine (SYNTHROID, LEVOTHROID) 75 MCG tablet Take 1 tablet (75 mcg total) by mouth daily. 90 tablet 1  . methotrexate 250 MG/10ML injection Inject 250 mg into the muscle once a week.    . naproxen sodium (ANAPROX) 220 MG tablet Take 220 mg by mouth as needed.    . polyethylene glycol powder (GLYCOLAX/MIRALAX) powder Take 17 g by mouth daily. 3350 g 1  . tiZANidine (ZANAFLEX) 2 MG tablet Take 1 tablet (2 mg total) by mouth at  bedtime. 30 tablet 1   No current facility-administered medications for this visit.     Allergies:   Sulfa antibiotics    Social History:  The patient  reports that she quit smoking about 25 years ago. Her smoking use included Cigarettes. She has a 2.50 pack-year smoking history. She has never used smokeless tobacco. She reports that she does not drink alcohol or use drugs.   Family History:  The patient's family history includes Arrhythmia in her father; Breast cancer (age of onset: 48) in her paternal aunt; Cervical cancer in her sister; Heart disease in her father; Hypertension in her mother; Lung cancer in her mother; Stroke in her mother; Uterine cancer in her mother.    ROS:  Please see the history of present illness.   Otherwise, review of systems are positive for none.   All other systems are reviewed and negative.    PHYSICAL EXAM: VS:  BP 100/62 (BP Location: Left Arm, Patient Position: Sitting, Cuff Size: Normal)   Pulse 79   Ht 5\' 7"  (1.702 m)   Wt 188 lb 8 oz (85.5 kg)   BMI 29.52 kg/m  , BMI Body mass index is 29.52 kg/m. GEN: Well nourished, well developed, in no acute distress  HEENT: normal  Neck: no JVD, carotid bruits, or masses Cardiac: RRR; no murmurs, rubs, or gallops,no edema  Respiratory:  clear to auscultation bilaterally, normal work of breathing GI: soft, nontender, nondistended, + BS MS: no deformity or atrophy  Skin: warm and dry, no rash Neuro:  Strength and sensation are intact Psych: euthymic mood, full affect   EKG:  EKG is ordered today. The ekg ordered today demonstrates normal sinus rhythm with no significant ST or T wave changes   Recent Labs: 05/30/2016: ALT 14 09/02/2016: BUN 15; Creatinine, Ser 0.86; Magnesium 1.9; Potassium 4.7; Sodium 143; TSH 1.290    Lipid Panel    Component Value Date/Time   CHOL 175 02/19/2014   TRIG 105 02/19/2014   HDL 70 02/19/2014   LDLCALC 84 02/19/2014      Wt Readings from Last 3 Encounters:    10/14/16 188 lb 8 oz (85.5 kg)  09/02/16 186 lb 8 oz (84.6 kg)  07/15/16 183 lb (83 kg)      No flowsheet data found.    ASSESSMENT AND PLAN:  1.  Atypical chest pain: Resolved completely with no recurrent episodes. Recent nuclear stress test was normal.  2. Palpitations: 30 day outpatient telemetry showed only occasional PACs and PVCs with one short run of atrial tachycardia. She reports that while she was wearing the monitor, she did not have any of her typical palpitations episodes. I explained to her that the workup so far is reassuring but if she develops recurrent symptoms we might need to consider a loop  recorder or a Smart phone monitor. I reviewed her recent labs which showed normal electrolytes and TSH.    Disposition:   FU with me as needed.   Signed,  Kathlyn Sacramento, MD  10/14/2016 5:59 PM    [place]

## 2016-11-15 ENCOUNTER — Other Ambulatory Visit: Payer: Self-pay | Admitting: Family Medicine

## 2016-11-15 DIAGNOSIS — Z1231 Encounter for screening mammogram for malignant neoplasm of breast: Secondary | ICD-10-CM

## 2016-11-23 ENCOUNTER — Ambulatory Visit: Admission: RE | Admit: 2016-11-23 | Payer: Medicare Other | Source: Ambulatory Visit

## 2016-11-29 ENCOUNTER — Ambulatory Visit
Admission: RE | Admit: 2016-11-29 | Discharge: 2016-11-29 | Disposition: A | Discharge status: Home / Self Care | Payer: Medicare Other | Source: Ambulatory Visit | Attending: Family Medicine | Admitting: Family Medicine

## 2016-11-29 DIAGNOSIS — Z1231 Encounter for screening mammogram for malignant neoplasm of breast: Secondary | ICD-10-CM | POA: Insufficient documentation

## 2016-11-29 HISTORY — DX: Personal history of irradiation: Z92.3

## 2016-11-29 HISTORY — DX: Personal history of antineoplastic chemotherapy: Z92.21

## 2016-12-01 ENCOUNTER — Encounter: Payer: Self-pay | Admitting: Family Medicine

## 2016-12-01 ENCOUNTER — Ambulatory Visit (INDEPENDENT_AMBULATORY_CARE_PROVIDER_SITE_OTHER): Payer: Medicare Other | Admitting: Family Medicine

## 2016-12-01 VITALS — BP 118/68 | HR 80 | Temp 97.8°F | Resp 16 | Ht 67.0 in | Wt 185.4 lb

## 2016-12-01 DIAGNOSIS — M62838 Other muscle spasm: Secondary | ICD-10-CM

## 2016-12-01 DIAGNOSIS — K648 Other hemorrhoids: Secondary | ICD-10-CM | POA: Insufficient documentation

## 2016-12-01 DIAGNOSIS — L409 Psoriasis, unspecified: Secondary | ICD-10-CM | POA: Diagnosis not present

## 2016-12-01 DIAGNOSIS — D229 Melanocytic nevi, unspecified: Secondary | ICD-10-CM | POA: Diagnosis not present

## 2016-12-01 DIAGNOSIS — E039 Hypothyroidism, unspecified: Secondary | ICD-10-CM | POA: Diagnosis not present

## 2016-12-01 DIAGNOSIS — R151 Fecal smearing: Secondary | ICD-10-CM | POA: Diagnosis not present

## 2016-12-01 DIAGNOSIS — E559 Vitamin D deficiency, unspecified: Secondary | ICD-10-CM | POA: Diagnosis not present

## 2016-12-01 DIAGNOSIS — L4050 Arthropathic psoriasis, unspecified: Secondary | ICD-10-CM | POA: Diagnosis not present

## 2016-12-01 DIAGNOSIS — Z9011 Acquired absence of right breast and nipple: Secondary | ICD-10-CM

## 2016-12-01 DIAGNOSIS — Z0000 Encounter for general adult medical examination without abnormal findings: Secondary | ICD-10-CM | POA: Diagnosis not present

## 2016-12-01 MED ORDER — LEVOTHYROXINE SODIUM 75 MCG PO TABS: 75.0000 ug | ORAL_TABLET | Freq: Every day | ORAL | 1 refills | Status: DC

## 2016-12-01 MED ORDER — TIZANIDINE HCL 2 MG PO TABS: 2.0000 mg | ORAL_TABLET | Freq: Every day | ORAL | 1 refills | Status: DC

## 2016-12-01 NOTE — Progress Notes (Signed)
Name: Nicole Contreras   MRN: [id number]    DOB: 07/25/1946   Date:12/01/2016       Progress Note  Subjective  Chief Complaint  No chief complaint on file.   HPI  Functional ability/safety issues: No Issues Hearing issues: Addressed  Activities of daily living: Discussed Home safety issues: No Issues  End Of Life Planning: Offered verbal information regarding advanced directives, healthcare power of attorney. Discussed importance of having it done  Preventative care, Health maintenance, Preventative health measures discussed.  Preventative screenings discussed today: lab work - done by [employer], last lipid in 2015 was normal , colonoscopy up to date 2017  , PAP no need - normal pap's until age 5 , mammogram is up to date , DEXA up to date - it was done 2014 - only osteopenia on wrist  Low Dose CT Chest recommended if Age 3-80 years, 30 pack-year currently smoking OR have quit w/in 15years.   Lifestyle risk factor issued reviewed: Diet, exercise, weight management, advised patient smoking is not healthy, nutrition/diet.  Preventative health measures discussed (5-10 year plan).  Reviewed and recommended vaccinations: - Pneumovax  - Prevnar  - Annual Influenza - Zostavax - Tdap   Depression screening: Done Fall risk screening: Done Discuss ADLs/IADLs: Done  Current medical providers: See HPI  Other health risk factors identified this visit: No other issues Cognitive impairment issues: None identified  All above discussed with patient. Appropriate education, counseling and referral will be made based upon the above.   Neck pain: she states she has a long history of right side neck pain/stiffness, started after right mastectomy. Pain is described as a tightness, radiates to right nuchal area and causes a headache. She is doing well on prn Tizanidine. She states only takes it when feels very tight and prevents her from having a headache  Hypothyroidism:  she takes Synthroid, she is compliant, she has chronic constipation but doing well with change in her diet,  no hair loss or significant weight changes.   Palpitation: she has seen cardiologist for similar symptoms in the past, and went back to see Dr. Linton Rump 2017 He wore an event monitor for 30 days and had labs done, but studies were negative  Psoriasis: she has hand deformity, sees Dr. Jefm Bryant and CBC is up to date. She also has a psoriasis in her nuchal area, we will refer her to dermatologist    Patient Active Problem List   Diagnosis Date Noted  . Internal hemorrhoids 12/01/2016  . Special screening for malignant neoplasms, colon   . Benign neoplasm of transverse colon   . Muscle spasms of neck 11/30/2015  . Numerous moles 11/30/2015  . Chronic constipation 09/14/2015  . Gastro-esophageal reflux disease without esophagitis 09/14/2015  . Acquired hypothyroidism 09/14/2015  . Menopause 09/14/2015  . Overweight 09/14/2015  . Hemorrhage, postmenopausal 09/14/2015  . Vitamin D deficiency 09/14/2015  . History of breast cancer 09/14/2015  . Psoriatic arthritis ([place]) 05/22/2014    Past Surgical History:  Procedure Laterality Date  . BREAST BIOPSY Left 1990   X 2- neg  . CARDIAC CATHETERIZATION  [facility]   [facility]; no stent  . CATARACT EXTRACTION W/PHACO Right 02/04/2016   Procedure: CATARACT EXTRACTION PHACO AND INTRAOCULAR LENS PLACEMENT (IOC);  Surgeon: Birder Robson, MD;  Location: [facility];  Service: Ophthalmology;  Laterality: Right;  [place] 00:44 AP% 19.2 CDE 8.49 fluid pack lot # [id number] H  . colonoscopy    . COLONOSCOPY WITH PROPOFOL N/A 07/15/2016  Procedure: COLONOSCOPY WITH PROPOFOL;  Surgeon: [name], MD;  Location: [place];  Service: Endoscopy;  Laterality: N/A;  . ENDOMETRIAL BIOPSY    . MASTECTOMY Right 1985  . POLYPECTOMY N/A 07/15/2016   Procedure: POLYPECTOMY;  Surgeon: [name], MD;  Location: [place];  Service: Endoscopy;   Laterality: N/A;    Family History  Problem Relation Age of Onset  . Uterine cancer Mother   . Lung cancer Mother   . Stroke Mother   . Hypertension Mother   . Heart disease Father   . Arrhythmia Father     afib  . Cervical cancer Sister   . Breast cancer Paternal Aunt 58    Social History   Social History  . Marital status: Married    Spouse name: N/A  . Number of children: N/A  . Years of education: N/A   Occupational History  . Not on file.   Social History Main Topics  . Smoking status: Former Smoker    Packs/day: 0.25    Years: 10.00    Types: Cigarettes    Quit date: 11/07/1990  . Smokeless tobacco: Never Used  . Alcohol use No  . Drug use: No  . Sexual activity: Yes   Other Topics Concern  . Not on file   Social History Narrative  . No narrative on file     Current Outpatient Prescriptions:  .  Ascorbic Acid (VITAMIN C) POWD, Take 5,000 packets by mouth daily., Disp: , Rfl:  .  Cholecalciferol (VITAMIN D) 2000 UNITS tablet, Take 2,000 Units by mouth daily., Disp: , Rfl:  .  Creatine POWD, 5 packets by Does not apply route daily., Disp: , Rfl:  .  diclofenac (CATAFLAM) 50 MG tablet, Take 50 mg by mouth., Disp: , Rfl:  .  levothyroxine (SYNTHROID, LEVOTHROID) 75 MCG tablet, Take 1 tablet (75 mcg total) by mouth daily., Disp: 90 tablet, Rfl: 1 .  methotrexate 250 MG/10ML injection, Inject 250 mg into the muscle once a week., Disp: , Rfl:  .  naproxen sodium (ANAPROX) 220 MG tablet, Take 220 mg by mouth as needed., Disp: , Rfl:  .  tiZANidine (ZANAFLEX) 2 MG tablet, Take 1 tablet (2 mg total) by mouth at bedtime., Disp: 30 tablet, Rfl: 1  Allergies  Allergen Reactions  . Sulfa Antibiotics Rash     ROS  Constitutional: Negative for fever or weight change.  Respiratory: Negative for cough and shortness of breath.   Cardiovascular: Negative for chest pain, intermittent  palpitations.  Gastrointestinal: Negative for abdominal pain, no bowel changes -  improvement of constipation .  Musculoskeletal: Negative for gait problem or joint swelling.  Skin: Positive  for rash.  Neurological: Negative for dizziness , seldom she has  headache.  No other specific complaints in a complete review of systems (except as listed in HPI above).  Objective  Vitals:   12/01/16 0815  BP: 118/68  Pulse: 80  Resp: 16  Temp: 97.8 F (36.6 C)  SpO2: 95%  Weight: 185 lb 6 oz (84.1 kg)  Height: 5\' 7"  (1.702 m)    Body mass index is 29.03 kg/m.  Physical Exam  Constitutional: Patient appears well-developed and well-nourished. No distress.  HENT: Head: Normocephalic and atraumatic. Ears: B TMs ok, no erythema or effusion; Nose: Nose normal. Mouth/Throat: Oropharynx is clear and moist. No oropharyngeal exudate.  Eyes: Conjunctivae and EOM are normal. Pupils are equal, round, and reactive to light. No scleral icterus.  Neck: Normal range  of motion. Neck supple. No JVD present. No thyromegaly present.  Cardiovascular: Normal rate, regular rhythm and normal heart sounds.  No murmur heard. No BLE edema. Pulmonary/Chest: Effort normal and breath sounds normal. No respiratory distress. Abdominal: Soft. Bowel sounds are normal, no distension. There is no tenderness. no masses Breast: no lumps or masses on left, mastectomy on right side FEMALE GENITALIA:  External genitalia normal External urethra normal Pelvic exam not done RECTAL: not done Musculoskeletal: Normal range of motion, no joint effusions. No gross deformities Neurological: he is alert and oriented to person, place, and time. No cranial nerve deficit. Coordination, balance, strength, speech and gait are normal.  Skin: Skin is warm and dry. Nuchal rash  Psychiatric: Patient has a normal mood and affect. behavior is normal. Judgment and thought content normal.  Recent Results (from the past 2160 hour(s))  TSH     Status: None   Collection Time: 09/02/16  2:12 PM  Result Value Ref Range   TSH  1.290 0.450 - 4.500 uIU/mL  Basic Metabolic Panel (BMET)     Status: Abnormal   Collection Time: 09/02/16  2:12 PM  Result Value Ref Range   Glucose 124 (H) 65 - 99 mg/dL   BUN 15 8 - 27 mg/dL   Creatinine, Ser 0.86 0.57 - 1.00 mg/dL   GFR calc non Af Amer 70 >59 mL/min/1.73   GFR calc Af Amer 80 >59 mL/min/1.73   BUN/Creatinine Ratio 17 12 - 28   Sodium 143 134 - 144 mmol/L   Potassium 4.7 3.5 - 5.2 mmol/L   Chloride 103 96 - 106 mmol/L   CO2 24 18 - 29 mmol/L   Calcium 9.3 8.7 - 10.3 mg/dL  Magnesium     Status: None   Collection Time: 09/02/16  2:12 PM  Result Value Ref Range   Magnesium 1.9 1.6 - 2.3 mg/dL  NM Myocar Multi W/Spect W/Wall Motion / EF     Status: None   Collection Time: 09/09/16  9:40 AM  Result Value Ref Range   Rest HR 69 bpm   Rest BP 124/76 mmHg   Percent HR 73 %   Peak HR 111 bpm   Peak BP 124/68 mmHg   SSS 0    SRS 4    SDS 0    TID 1.05    LV sys vol 20 mL   LV dias vol 62 46 - 106 mL      PHQ2/9: Depression screen [facility] 2/9 12/01/2016 11/30/2015 09/14/2015  Decreased Interest 0 0 0  Down, Depressed, Hopeless 0 0 0  PHQ - 2 Score 0 0 0     Fall Risk: Fall Risk  12/01/2016 11/30/2015 09/14/2015  Falls in the past year? No No No     Functional Status Survey: Is the patient deaf or have difficulty hearing?: No Does the patient have difficulty seeing, even when wearing glasses/contacts?: No Does the patient have difficulty concentrating, remembering, or making decisions?: No Does the patient have difficulty walking or climbing stairs?: No Does the patient have difficulty dressing or bathing?: No Does the patient have difficulty doing errands alone such as visiting a doctor's office or shopping?: No    Assessment & Plan  1. Medicare annual wellness visit, subsequent  Discussed importance of 150 minutes of physical activity weekly, eat two servings of fish weekly, eat one serving of tree nuts ( cashews, pistachios, pecans, almonds.Marland Kitchen) every  other day, eat 6 servings of fruit/vegetables daily and drink plenty of  water and avoid sweet beverages.   2. Acquired hypothyroidism  Last TSH was normal Fall 2017  3. Vitamin D deficiency  Discussed supplementation  4. History of mastectomy, right  rx for prosthesis was written   5. 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 Numerous moles  - Ambulatory referral to Dermatology  7. Psoriasis  - Ambulatory referral to Dermatology  8. Muscle spasms of neck  - tiZANidine (ZANAFLEX) 2 MG tablet; Take 1 tablet (2 mg total) by mouth at bedtime.  Dispense: 30 tablet; Refill: 1  9. Internal hemorrhoids   10. Fecal smearing  She will contact Dr. Durwin Reges

## 2017-08-04 ENCOUNTER — Other Ambulatory Visit: Payer: Self-pay

## 2017-08-04 ENCOUNTER — Telehealth: Payer: Self-pay | Admitting: Family Medicine

## 2017-08-04 NOTE — Telephone Encounter (Signed)
Patient requesting refill of Levothyroxine to [facility].  

## 2017-08-06 NOTE — Telephone Encounter (Signed)
She needs to be seen, we don't have labs for the past year, I will send a 30 day supply only

## 2017-08-07 NOTE — Telephone Encounter (Signed)
Left voice message informing pt °

## 2017-08-08 ENCOUNTER — Encounter: Payer: Self-pay | Admitting: Family Medicine

## 2017-08-08 ENCOUNTER — Ambulatory Visit (INDEPENDENT_AMBULATORY_CARE_PROVIDER_SITE_OTHER): Payer: Medicare Other | Admitting: Family Medicine

## 2017-08-08 VITALS — BP 132/76 | HR 100 | Temp 98.2°F | Resp 16 | Ht 67.0 in | Wt 189.6 lb

## 2017-08-08 DIAGNOSIS — E039 Hypothyroidism, unspecified: Secondary | ICD-10-CM

## 2017-08-08 DIAGNOSIS — J3489 Other specified disorders of nose and nasal sinuses: Secondary | ICD-10-CM | POA: Diagnosis not present

## 2017-08-08 DIAGNOSIS — E559 Vitamin D deficiency, unspecified: Secondary | ICD-10-CM | POA: Diagnosis not present

## 2017-08-08 DIAGNOSIS — Z79899 Other long term (current) drug therapy: Secondary | ICD-10-CM

## 2017-08-08 DIAGNOSIS — L409 Psoriasis, unspecified: Secondary | ICD-10-CM | POA: Diagnosis not present

## 2017-08-08 DIAGNOSIS — R002 Palpitations: Secondary | ICD-10-CM

## 2017-08-08 DIAGNOSIS — M62838 Other muscle spasm: Secondary | ICD-10-CM

## 2017-08-08 DIAGNOSIS — L4050 Arthropathic psoriasis, unspecified: Secondary | ICD-10-CM | POA: Diagnosis not present

## 2017-08-08 DIAGNOSIS — R079 Chest pain, unspecified: Secondary | ICD-10-CM

## 2017-08-08 DIAGNOSIS — R739 Hyperglycemia, unspecified: Secondary | ICD-10-CM

## 2017-08-08 DIAGNOSIS — Z23 Encounter for immunization: Secondary | ICD-10-CM | POA: Diagnosis not present

## 2017-08-08 LAB — TSH: TSH: 0.81 m[IU]/L (ref 0.40–4.50)

## 2017-08-08 MED ORDER — CLOBETASOL PROPIONATE 0.05 % EX LIQD: Freq: Two times a day (BID) | CUTANEOUS | 0 refills | Status: AC

## 2017-08-08 MED ORDER — FLUTICASONE PROPIONATE 50 MCG/ACT NA SUSP: 2.0000 | Freq: Every day | NASAL | 6 refills | Status: DC
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
## 2017-08-09 LAB — CBC WITH DIFFERENTIAL/PLATELET
BASOS PCT: 0.6 %
Basophils Absolute: 51 cells/uL (ref 0–200)
EOS ABS: 136 {cells}/uL (ref 15–500)
Eosinophils Relative: 1.6 %
HCT: 38.4 % (ref 35.0–45.0)
HEMOGLOBIN: 12.4 g/dL (ref 11.7–15.5)
LYMPHS ABS: 1853 {cells}/uL (ref 850–3900)
MCH: 26.3 pg — AB (ref 27.0–33.0)
MCHC: 32.3 g/dL (ref 32.0–36.0)
MCV: 81.5 fL (ref 80.0–100.0)
MPV: 9.8 fL (ref 7.5–12.5)
Monocytes Relative: 6.7 %
Neutro Abs: 5891 cells/uL (ref 1500–7800)
Neutrophils Relative %: 69.3 %
Platelets: 295 10*3/uL (ref 140–400)
RBC: 4.71 10*6/uL (ref 3.80–5.10)
RDW: 13.3 % (ref 11.0–15.0)
Total Lymphocyte: 21.8 %
WBC: 8.5 10*3/uL (ref 3.8–10.8)
WBCMIX: 570 {cells}/uL (ref 200–950)

## 2017-08-09 LAB — COMPLETE METABOLIC PANEL WITH GFR
AG Ratio: 1.4 (calc) (ref 1.0–2.5)
ALBUMIN MSPROF: 4.1 g/dL (ref 3.6–5.1)
ALKALINE PHOSPHATASE (APISO): 103 U/L (ref 33–130)
ALT: 12 U/L (ref 6–29)
AST: 16 U/L (ref 10–35)
BILIRUBIN TOTAL: 0.2 mg/dL (ref 0.2–1.2)
BUN: 18 mg/dL (ref 7–25)
CHLORIDE: 104 mmol/L (ref 98–110)
CO2: 28 mmol/L (ref 20–32)
CREATININE: 0.79 mg/dL (ref 0.50–0.99)
Calcium: 9.5 mg/dL (ref 8.6–10.4)
GFR, Est African American: 89 mL/min/{1.73_m2} (ref >=60)
GFR, Est Non African American: 77 mL/min/{1.73_m2} (ref >=60)
GLUCOSE: 105 mg/dL (ref 65–139)
Globulin: 3 g/dL (calc) (ref 1.9–3.7)
POTASSIUM: 4.4 mmol/L (ref 3.5–5.3)
SODIUM: 141 mmol/L (ref 135–146)
TOTAL PROTEIN: 7.1 g/dL (ref 6.1–8.1)

## 2017-08-09 LAB — HEMOGLOBIN A1C
EAG (MMOL/L): 6.2 (calc)
Hgb A1c MFr Bld: 5.5 % of total Hgb (ref <5.7)
Mean Plasma Glucose: 111 (calc)

## 2017-08-09 LAB — VITAMIN D 25 HYDROXY (VIT D DEFICIENCY, FRACTURES): Vit D, 25-Hydroxy: 39 ng/mL (ref 30–100)

## 2017-11-12 ENCOUNTER — Ambulatory Visit
Admission: EM | Admit: 2017-11-12 | Discharge: 2017-11-12 | Disposition: A | Discharge status: Home / Self Care | Payer: Medicare Other | Attending: Family Medicine | Admitting: Family Medicine

## 2017-11-12 ENCOUNTER — Encounter: Payer: Self-pay | Admitting: Gynecology

## 2017-11-12 ENCOUNTER — Other Ambulatory Visit: Payer: Self-pay

## 2017-11-12 DIAGNOSIS — N3001 Acute cystitis with hematuria: Secondary | ICD-10-CM

## 2017-11-12 LAB — URINALYSIS, COMPLETE (UACMP) WITH MICROSCOPIC
Bilirubin Urine: NEGATIVE
GLUCOSE, UA: NEGATIVE mg/dL
Ketones, ur: NEGATIVE mg/dL
Nitrite: NEGATIVE
PROTEIN: NEGATIVE mg/dL
SPECIFIC GRAVITY, URINE: 1.01 (ref 1.005–1.030)
pH: 7 (ref 5.0–8.0)

## 2017-11-12 MED ORDER — CEPHALEXIN 500 MG PO CAPS: 500.0000 mg | ORAL_CAPSULE | Freq: Two times a day (BID) | ORAL | 0 refills | Status: DC

## 2017-11-12 NOTE — ED Provider Notes (Signed)
[facility]   CSN: [id number] Arrival date & time: 11/12/17  1157  History   Chief Complaint Chief Complaint  Patient presents with  . Urinary Tract Infection   HPI   72 year old female presents with concerns for UTI.  Patient reports her symptoms started yesterday.  She has had dysuria, urgency, and frequency.  Associated nausea.  No vomiting.  No flank pain.  No abdominal pain.  No known exacerbating relieving factors.  No other associated symptoms.  No other complaints at this time.  Past Medical History:  Diagnosis Date  . Atypical chest pain    a. 03/2014 ETT: Ex time 4:10, Max HR 160 bpm, no acute st/t changes.  . Breast cancer ([place])    a. 1986 s/p radiation.  . Bright's disease    Hx  . Diastolic dysfunction    a. 02/2014 Echo: EF 55-60%, no rwma, Gr1 DD.  . Dizziness and giddiness   . Fecal smearing   . GERD (gastroesophageal reflux disease)   . Heart murmur    as child  . Insomnia, unspecified   . Obesity, unspecified   . Personal history of chemotherapy   . Personal history of radiation therapy   . PONV (postoperative nausea and vomiting)    after cataract procedure  . Postmenopausal bleeding   . Psoriatic arthropathy ([place])   . Reflux esophagitis   . Right cataract    a. 01/2016 s/p cataract surgery.  Marland Kitchen Spasm of muscle   . Unspecified hypothyroidism   . Unspecified urinary incontinence   . Unspecified vitamin D deficiency     Patient Active Problem List   Diagnosis Date Noted  . Internal hemorrhoids 12/01/2016  . Special screening for malignant neoplasms, colon   . Benign neoplasm of transverse colon   . Muscle spasms of neck 11/30/2015  . Numerous moles 11/30/2015  . Chronic constipation 09/14/2015  . Gastro-esophageal reflux disease without esophagitis 09/14/2015  . Acquired hypothyroidism 09/14/2015  . Menopause 09/14/2015  . Overweight 09/14/2015  . Hemorrhage, postmenopausal 09/14/2015  . Vitamin D deficiency 09/14/2015  . History  of breast cancer 09/14/2015  . Psoriatic arthritis ([place]) 05/22/2014    Past Surgical History:  Procedure Laterality Date  . BREAST BIOPSY Left 1990   X 2- neg  . CARDIAC CATHETERIZATION  [facility]   [facility]; no stent  . CATARACT EXTRACTION W/PHACO Right 02/04/2016   Procedure: CATARACT EXTRACTION PHACO AND INTRAOCULAR LENS PLACEMENT (IOC);  Surgeon: Birder Robson, MD;  Location: [facility];  Service: Ophthalmology;  Laterality: Right;  [place] 00:44 AP% 19.2 CDE 8.49 fluid pack lot # [id number]  . colonoscopy    . COLONOSCOPY WITH PROPOFOL N/A 07/15/2016   Procedure: COLONOSCOPY WITH PROPOFOL;  Surgeon: [name], MD;  Location: [place];  Service: Endoscopy;  Laterality: N/A;  . ENDOMETRIAL BIOPSY    . MASTECTOMY Right 1985  . POLYPECTOMY N/A 07/15/2016   Procedure: POLYPECTOMY;  Surgeon: [name], MD;  Location: [place];  Service: Endoscopy;  Laterality: N/A;    OB History    No data available       Home Medications    Prior to Admission medications   Medication Sig Start Date End Date Taking? Authorizing Provider  Cholecalciferol (VITAMIN D) 2000 UNITS tablet Take 2,000 Units by mouth daily.   Yes [provider]  Clobetasol Propionate (TEMOVATE) 0.05 % external spray Apply topically 2 (two) times daily. 08/08/17  Yes Sowles, Drue Stager, MD  diclofenac (CATAFLAM) 50 MG tablet Take 50  mg by mouth.   Yes [provider]  levothyroxine (SYNTHROID, LEVOTHROID) 75 MCG tablet Take 1 tablet (75 mcg total) by mouth daily. 08/06/17  Yes Sowles, Drue Stager, MD  methotrexate 250 MG/10ML injection Inject 250 mg into the muscle once a week. 08/18/15  Yes Emmaline Kluver., MD  naproxen sodium (ANAPROX) 220 MG tablet Take 220 mg by mouth as needed.   Yes [provider]  cephALEXin (KEFLEX) 500 MG capsule Take 1 capsule (500 mg total) by mouth 2 (two) times daily. 11/12/17   Coral Spikes, DO    Family History Family History  Problem Relation Age of  Onset  . Uterine cancer Mother   . Lung cancer Mother   . Stroke Mother   . Hypertension Mother   . Heart disease Father   . Arrhythmia Father        afib  . Cervical cancer Sister   . Breast cancer Paternal Aunt 33    Social History Social History   Tobacco Use  . Smoking status: Former Smoker    Packs/day: 0.25    Years: 10.00    Pack years: 2.50    Types: Cigarettes    Last attempt to quit: 11/07/1990    Years since quitting: 27.0  . Smokeless tobacco: Never Used  Substance Use Topics  . Alcohol use: No    Alcohol/week: 0.0 oz  . Drug use: No     Allergies   Sulfa antibiotics   Review of Systems Review of Systems  Gastrointestinal: Positive for nausea.  Genitourinary: Positive for dysuria, frequency and urgency.  All other systems reviewed and are negative.  Physical Exam Triage Vital Signs ED Triage Vitals  Enc Vitals Group     BP 11/12/17 1222 (!) 162/82     Pulse Rate 11/12/17 1222 75     Resp 11/12/17 1222 16     Temp 11/12/17 1222 97.8 F (36.6 C)     Temp Source 11/12/17 1222 Oral     SpO2 11/12/17 1222 98 %     Weight 11/12/17 1219 190 lb (86.2 kg)     Height 11/12/17 1219 5\' 7"  (1.702 m)     Head Circumference --      Peak Flow --      Pain Score 11/12/17 1219 5     Pain Loc --      Pain Edu? --      Excl. in [place]? --    Updated Vital Signs BP (!) 162/82 (BP Location: Left Arm)   Pulse 75   Temp 97.8 F (36.6 C) (Oral)   Resp 16   Ht 5\' 7"  (1.702 m)   Wt 190 lb (86.2 kg)   SpO2 98%   BMI 29.76 kg/m     Physical Exam  Constitutional: She is oriented to person, place, and time. She appears well-developed. No distress.  Cardiovascular: Normal rate and regular rhythm.  Pulmonary/Chest: Effort normal and breath sounds normal. She has no wheezes. She has no rales.  Abdominal: Soft. She exhibits no distension. There is no tenderness. There is no rebound and no guarding.  Neurological: She is alert and oriented to person, place, and time.    Psychiatric: She has a normal mood and affect. Her behavior is normal.  Nursing note and vitals reviewed.  UC Treatments / Results  Labs (all labs ordered are listed, but only abnormal results are displayed) Labs Reviewed  URINALYSIS, COMPLETE (UACMP) WITH MICROSCOPIC - Abnormal; Notable for the following  components:      Result Value   Hgb urine dipstick SMALL (*)    Leukocytes, UA SMALL (*)    Squamous Epithelial / LPF 0-5 (*)    Bacteria, UA FEW (*)    All other components within normal limits  URINE CULTURE    EKG  EKG Interpretation None       Radiology No results found.  Procedures Procedures (including critical care time)  Medications Ordered in UC Medications - No data to display   Initial Impression / Assessment and Plan / UC Course  I have reviewed the triage vital signs and the nursing notes.  Pertinent labs & imaging results that were available during my care of the patient were reviewed by me and considered in my medical decision making (see chart for details).     72 year old female presents with UTI.  Treating with Keflex.  Sending culture.  Final Clinical Impressions(s) / UC Diagnoses   Final diagnoses:  Acute cystitis with hematuria    ED Discharge Orders        Ordered    cephALEXin (KEFLEX) 500 MG capsule  2 times daily     11/12/17 1310     Controlled Substance Prescriptions [place] Controlled Substance Registry consulted? Not Applicable   Coral Spikes, DO 11/12/17 1352

## 2017-11-12 NOTE — ED Triage Notes (Signed)
Per patient possible UTI. Pt. Sx. burning with urination and lower back pain.

## 2017-11-15 LAB — URINE CULTURE: Culture: >=100000 — AB

## 2017-12-05 ENCOUNTER — Encounter: Payer: 59 | Admitting: Family Medicine

## 2017-12-08 ENCOUNTER — Other Ambulatory Visit: Payer: Self-pay | Admitting: Family Medicine

## 2017-12-08 NOTE — Telephone Encounter (Signed)
Refill request for thyroid medication: Levothyroxine 75 mcg  Last Physical: 12/01/2016  Lab Results  Component Value Date   TSH 0.81 08/08/2017   Follow-up on file. 02/06/2018

## 2017-12-22 ENCOUNTER — Ambulatory Visit: Payer: Medicare Other | Admitting: Family Medicine

## 2017-12-22 ENCOUNTER — Encounter: Payer: Self-pay | Admitting: Family Medicine

## 2017-12-22 VITALS — BP 122/76 | HR 79 | Temp 97.8°F | Resp 16 | Ht 67.0 in | Wt 191.7 lb

## 2017-12-22 DIAGNOSIS — N390 Urinary tract infection, site not specified: Secondary | ICD-10-CM | POA: Diagnosis not present

## 2017-12-22 LAB — POCT URINALYSIS DIPSTICK
BILIRUBIN UA: NEGATIVE
GLUCOSE UA: NEGATIVE
Ketones, UA: NEGATIVE
Nitrite, UA: NEGATIVE
ODOR: NEGATIVE
Spec Grav, UA: 1.015 (ref 1.010–1.025)
Urobilinogen, UA: 0.2 E.U./dL
pH, UA: 5 (ref 5.0–8.0)

## 2017-12-22 LAB — URINALYSIS, ROUTINE W REFLEX MICROSCOPIC
BILIRUBIN URINE: NEGATIVE
GLUCOSE, UA: NEGATIVE
Hyaline Cast: NONE SEEN /LPF
KETONES UR: NEGATIVE
NITRITE: NEGATIVE
PROTEIN: NEGATIVE
SPECIFIC GRAVITY, URINE: 1.016 (ref 1.001–1.03)
SQUAMOUS EPITHELIAL / LPF: NONE SEEN /HPF (ref <=5)
WBC, UA: >=60 /HPF — AB (ref 0–5)
pH: 5.5 (ref 5.0–8.0)

## 2017-12-22 MED ORDER — CIPROFLOXACIN HCL 500 MG PO TABS: 500.0000 mg | ORAL_TABLET | Freq: Two times a day (BID) | ORAL | 0 refills | Status: AC

## 2017-12-22 NOTE — Progress Notes (Signed)
Name: Nicole Contreras   MRN: [id number]    DOB: 02-16-1946   Date:12/22/2017       Progress Note  Subjective  Chief Complaint  Chief Complaint  Patient presents with  . Urinary Tract Infection    Burnning with urination, pressure in the pelvic area, foul oder, lower back pain     Urinary Tract Infection   This is a new problem. The current episode started yesterday. The problem occurs every urination. The quality of the pain is described as aching. There has been no fever. Associated symptoms include flank pain and urgency. Pertinent negatives include no chills or hematuria. She has tried nothing for the symptoms.    Past Medical History:  Diagnosis Date  . Atypical chest pain    a. 03/2014 ETT: Ex time 4:10, Max HR 160 bpm, no acute st/t changes.  . Breast cancer ([place])    a. 1986 s/p radiation.  . Bright's disease    Hx  . Diastolic dysfunction    a. 02/2014 Echo: EF 55-60%, no rwma, Gr1 DD.  . Dizziness and giddiness   . Fecal smearing   . GERD (gastroesophageal reflux disease)   . Heart murmur    as child  . Insomnia, unspecified   . Obesity, unspecified   . Personal history of chemotherapy   . Personal history of radiation therapy   . PONV (postoperative nausea and vomiting)    after cataract procedure  . Postmenopausal bleeding   . Psoriatic arthropathy ([place])   . Reflux esophagitis   . Right cataract    a. 01/2016 s/p cataract surgery.  Marland Kitchen Spasm of muscle   . Unspecified hypothyroidism   . Unspecified urinary incontinence   . Unspecified vitamin D deficiency     Past Surgical History:  Procedure Laterality Date  . BREAST BIOPSY Left 1990   X 2- neg  . CARDIAC CATHETERIZATION  [facility]   [facility]; no stent  . CATARACT EXTRACTION W/PHACO Right 02/04/2016   Procedure: CATARACT EXTRACTION PHACO AND INTRAOCULAR LENS PLACEMENT (IOC);  Surgeon: Birder Robson, MD;  Location: [facility];  Service: Ophthalmology;  Laterality: Right;  [place] 00:44 AP% 19.2 CDE 8.49 fluid pack  lot # [id number]  . colonoscopy    . COLONOSCOPY WITH PROPOFOL N/A 07/15/2016   Procedure: COLONOSCOPY WITH PROPOFOL;  Surgeon: [name], MD;  Location: [place];  Service: Endoscopy;  Laterality: N/A;  . ENDOMETRIAL BIOPSY    . MASTECTOMY Right 1985  . POLYPECTOMY N/A 07/15/2016   Procedure: POLYPECTOMY;  Surgeon: [name], MD;  Location: [place];  Service: Endoscopy;  Laterality: N/A;    Family History  Problem Relation Age of Onset  . Uterine cancer Mother   . Lung cancer Mother   . Stroke Mother   . Hypertension Mother   . Heart disease Father   . Arrhythmia Father        afib  . Cervical cancer Sister   . Breast cancer Paternal Aunt 25    Social History   Socioeconomic History  . Marital status: Married    Spouse name: Not on file  . Number of children: Not on file  . Years of education: Not on file  . Highest education level: Not on file  Social Needs  . Financial resource strain: Not on file  . Food insecurity - worry: Not on file  . Food insecurity - inability: Not on file  . Transportation needs - medical: Not on file  . Transportation needs -  non-medical: Not on file  Occupational History  . Not on file  Tobacco Use  . Smoking status: Former Smoker    Packs/day: 0.25    Years: 10.00    Pack years: 2.50    Types: Cigarettes    Last attempt to quit: 11/07/1990    Years since quitting: 27.1  . Smokeless tobacco: Never Used  Substance and Sexual Activity  . Alcohol use: No    Alcohol/week: 0.0 oz  . Drug use: No  . Sexual activity: Yes  Other Topics Concern  . Not on file  Social History Narrative  . Not on file     Current Outpatient Medications:  .  Cholecalciferol (VITAMIN D) 2000 UNITS tablet, Take 2,000 Units by mouth daily., Disp: , Rfl:  .  Clobetasol Propionate (TEMOVATE) 0.05 % external spray, Apply topically 2 (two) times daily., Disp: 59 mL, Rfl: 0 .  diclofenac (CATAFLAM) 50 MG tablet, Take 50 mg by mouth., Disp: ,  Rfl:  .  levothyroxine (SYNTHROID, LEVOTHROID) 75 MCG tablet, Take 1 tablet (75 mcg total) by mouth daily., Disp: 30 tablet, Rfl: 2 .  methotrexate 250 MG/10ML injection, Inject 250 mg into the muscle once a week., Disp: , Rfl:  .  naproxen sodium (ANAPROX) 220 MG tablet, Take 220 mg by mouth as needed., Disp: , Rfl:  .  TURMERIC PO, Take 1 tablet by mouth daily., Disp: , Rfl:  .  cephALEXin (KEFLEX) 500 MG capsule, Take 1 capsule (500 mg total) by mouth 2 (two) times daily. (Patient not taking: Reported on 12/22/2017), Disp: 10 capsule, Rfl: 0  Allergies  Allergen Reactions  . Sulfa Antibiotics Rash     Review of Systems  Constitutional: Negative for chills.  Genitourinary: Positive for flank pain and urgency. Negative for hematuria.    Objective  Vitals:   12/22/17 0930  BP: 122/76  Pulse: 79  Resp: 16  Temp: 97.8 F (36.6 C)  TempSrc: Oral  SpO2: 98%  Weight: 191 lb 11.2 oz (87 kg)  Height: 5\' 7"  (1.702 m)    Physical Exam  Constitutional: She is oriented to person, place, and time and well-developed, well-nourished, and in no distress.  HENT:  Head: Normocephalic and atraumatic.  Cardiovascular: Normal rate, regular rhythm and normal heart sounds.  No murmur heard. Pulmonary/Chest: Effort normal and breath sounds normal. She has no wheezes.  Abdominal: Soft. Bowel sounds are normal. There is tenderness in the suprapubic area. There is no CVA tenderness.  Musculoskeletal: She exhibits no edema.  Neurological: She is alert and oriented to person, place, and time.  Nursing note and vitals reviewed.      Recent Results (from the past 2160 hour(s))  Urinalysis, Complete w Microscopic     Status: Abnormal   Collection Time: 11/12/17 12:10 PM  Result Value Ref Range   Color, Urine YELLOW YELLOW   APPearance CLEAR CLEAR   Specific Gravity, Urine 1.010 1.005 - 1.030   pH 7.0 5.0 - 8.0   Glucose, UA NEGATIVE NEGATIVE mg/dL   Hgb urine dipstick SMALL (A) NEGATIVE    Bilirubin Urine NEGATIVE NEGATIVE   Ketones, ur NEGATIVE NEGATIVE mg/dL   Protein, ur NEGATIVE NEGATIVE mg/dL   Nitrite NEGATIVE NEGATIVE   Leukocytes, UA SMALL (A) NEGATIVE   Squamous Epithelial / LPF 0-5 (A) NONE SEEN   WBC, UA 21-50 0 - 5 WBC/hpf   RBC / HPF 6-30 0 - 5 RBC/hpf   Bacteria, UA FEW (A) NONE SEEN    Comment:  Performed at [facility], [address], [place], [place] [address]  Urine culture     Status: Abnormal   Collection Time: 11/12/17 12:10 PM  Result Value Ref Range   Specimen Description      URINE, RANDOM Performed at [facility], [address] [place], [place] [address]    Special Requests      NONE Performed at [facility], [address], [place], [place] [address]    Culture >=100,000 COLONIES/mL PROTEUS MIRABILIS (A)    Report Status 11/15/2017 FINAL    Organism ID, Bacteria PROTEUS MIRABILIS (A)       Susceptibility   Proteus mirabilis - MIC*    AMPICILLIN <=2 SENSITIVE Sensitive     CEFAZOLIN 8 SENSITIVE Sensitive     CEFTRIAXONE <=1 SENSITIVE Sensitive     CIPROFLOXACIN <=0.25 SENSITIVE Sensitive     GENTAMICIN <=1 SENSITIVE Sensitive     IMIPENEM 2 SENSITIVE Sensitive     NITROFURANTOIN 256 RESISTANT Resistant     TRIMETH/SULFA <=20 SENSITIVE Sensitive     AMPICILLIN/SULBACTAM <=2 SENSITIVE Sensitive     PIP/TAZO <=4 SENSITIVE Sensitive     * >=100,000 COLONIES/mL PROTEUS MIRABILIS  POCT urinalysis dipstick     Status: Abnormal   Collection Time: 12/22/17  9:39 AM  Result Value Ref Range   Color, UA yellow    Clarity, UA cloudy    Glucose, UA neg    Bilirubin, UA neg    Ketones, UA neg    Spec Grav, UA 1.015 1.010 - 1.025   Blood, UA postive    pH, UA 5.0 5.0 - 8.0   Protein, UA trace    Urobilinogen, UA 0.2 0.2 or 1.0 E.U./dL   Nitrite, UA neg    Leukocytes, UA Large (3+) (A) Negative   Appearance cloudy    Odor neg      Assessment & Plan  1. UTI (urinary tract infection) with  pyuria Suspect acute cystitis, started on ciprofloxacin for 7 days, obtain urine culture - POCT urinalysis dipstick - Urinalysis, Routine w reflex microscopic - Urine Culture - ciprofloxacin (CIPRO) 500 MG tablet; Take 1 tablet (500 mg total) by mouth 2 (two) times daily for 7 days.  Dispense: 14 tablet; Refill: 0  Syed Asad A. [facility] 12/22/2017 9:59 AM

## 2017-12-25 LAB — URINE CULTURE
MICRO NUMBER: 90208678
SPECIMEN QUALITY:: ADEQUATE

## 2018-01-09 ENCOUNTER — Telehealth: Payer: Self-pay

## 2018-01-09 NOTE — Telephone Encounter (Signed)
Called to f/u re: today's missed appt for AWV w/ NHA. States she is in the process of moving and requested to reschedule. Pt has been rescheduled to 01/16/18.

## 2018-02-06 ENCOUNTER — Encounter: Payer: Medicare Other | Admitting: Family Medicine

## 2018-02-06 ENCOUNTER — Ambulatory Visit: Payer: Medicare Other | Admitting: Family Medicine

## 2018-03-09 ENCOUNTER — Ambulatory Visit: Payer: Medicare Other

## 2018-03-09 VITALS — BP 122/60 | HR 77 | Temp 97.9°F | Resp 12 | Ht 67.0 in | Wt 192.4 lb

## 2018-03-09 DIAGNOSIS — Z1239 Encounter for other screening for malignant neoplasm of breast: Secondary | ICD-10-CM

## 2018-03-09 DIAGNOSIS — Z0000 Encounter for general adult medical examination without abnormal findings: Secondary | ICD-10-CM | POA: Diagnosis not present

## 2018-03-09 DIAGNOSIS — Z1231 Encounter for screening mammogram for malignant neoplasm of breast: Secondary | ICD-10-CM

## 2018-03-09 NOTE — Patient Instructions (Signed)
Nicole Contreras , Thank you for taking time to come for your Medicare Wellness Visit. I appreciate your ongoing commitment to your health goals. Please review the following plan we discussed and let me know if I can assist you in the future.   Screening recommendations/referrals: Colorectal Screening: Completed 07/15/16. Repeat every 10 years  Mammogram: Completed 11/29/16. Ordered today. Please call to schedule your appointment   Bone Density: Completed 08/21/13. Osteoporotic screenings no longer required Lung Cancer Screening: You do not qualify for this screening Hepatitis C Screening: Completed 03/20/13  Vision and Dental Exams: Recommended annual ophthalmology exams for early detection of glaucoma and other disorders of the eye Recommended annual dental exams for proper oral hygiene  Vaccinations: Influenza vaccine: Up to date Pneumococcal vaccine: Completed series Tdap vaccine: Up to date Shingles vaccine: Completed 04/08/11. Please call your insurance company to determine your out of pocket expense for the Shingrix vaccine. You may also receive this vaccine at your local pharmacy or Health Dept.  Advanced directives: Advance directive discussed with you today. I have provided a copy for you to complete at home and have notarized. Once this is complete please bring a copy in to our office so we can scan it into your chart.  Conditions/risks identified: Recommend to drink at least 6-8 8oz glasses of water per day.  Next appointment: Please schedule your Annual Wellness Visit with your Nurse Health Advisor in one year.  Preventive Care 28 Years and Older, Female Preventive care refers to lifestyle choices and visits with your health care provider that can promote health and wellness. What does preventive care include?  A yearly physical exam. This is also called an annual well check.  Dental exams once or twice a year.  Routine eye exams. Ask your health care provider how often you should  have your eyes checked.  Personal lifestyle choices, including:  Daily care of your teeth and gums.  Regular physical activity.  Eating a healthy diet.  Avoiding tobacco and drug use.  Limiting alcohol use.  Practicing safe sex.  Taking low-dose aspirin every day.  Taking vitamin and mineral supplements as recommended by your health care provider. What happens during an annual well check? The services and screenings done by your health care provider during your annual well check will depend on your age, overall health, lifestyle risk factors, and family history of disease. Counseling  Your health care provider may ask you questions about your:  Alcohol use.  Tobacco use.  Drug use.  Emotional well-being.  Home and relationship well-being.  Sexual activity.  Eating habits.  History of falls.  Memory and ability to understand (cognition).  Work and work Statistician.  Reproductive health. Screening  You may have the following tests or measurements:  Height, weight, and BMI.  Blood pressure.  Lipid and cholesterol levels. These may be checked every 5 years, or more frequently if you are over 57 years old.  Skin check.  Lung cancer screening. You may have this screening every year starting at age 13 if you have a 30-pack-year history of smoking and currently smoke or have quit within the past 15 years.  Fecal occult blood test (FOBT) of the stool. You may have this test every year starting at age 1.  Flexible sigmoidoscopy or colonoscopy. You may have a sigmoidoscopy every 5 years or a colonoscopy every 10 years starting at age 62.  Hepatitis C blood test.  Hepatitis B blood test.  Sexually transmitted disease (STD) testing.  Diabetes  screening. This is done by checking your blood sugar (glucose) after you have not eaten for a while (fasting). You may have this done every 1-3 years.  Bone density scan. This is done to screen for osteoporosis. You may  have this done starting at age 49.  Mammogram. This may be done every 1-2 years. Talk to your health care provider about how often you should have regular mammograms. Talk with your health care provider about your test results, treatment options, and if necessary, the need for more tests. Vaccines  Your health care provider may recommend certain vaccines, such as:  Influenza vaccine. This is recommended every year.  Tetanus, diphtheria, and acellular pertussis (Tdap, Td) vaccine. You may need a Td booster every 10 years.  Zoster vaccine. You may need this after age 19.  Pneumococcal 13-valent conjugate (PCV13) vaccine. One dose is recommended after age 57.  Pneumococcal polysaccharide (PPSV23) vaccine. One dose is recommended after age 64. Talk to your health care provider about which screenings and vaccines you need and how often you need them. This information is not intended to replace advice given to you by your health care provider. Make sure you discuss any questions you have with your health care provider. Document Released: 11/20/2015 Document Revised: 07/13/2016 Document Reviewed: 08/25/2015 Elsevier Interactive Patient Education  2017 [place] Prevention in the Home Falls can cause injuries. They can happen to people of all ages. There are many things you can do to make your home safe and to help prevent falls. What can I do on the outside of my home?  Regularly fix the edges of walkways and driveways and fix any cracks.  Remove anything that might make you trip as you walk through a door, such as a raised step or threshold.  Trim any bushes or trees on the path to your home.  Use bright outdoor lighting.  Clear any walking paths of anything that might make someone trip, such as rocks or tools.  Regularly check to see if handrails are loose or broken. Make sure that both sides of any steps have handrails.  Any raised decks and porches should have guardrails  on the edges.  Have any leaves, snow, or ice cleared regularly.  Use sand or salt on walking paths during winter.  Clean up any spills in your garage right away. This includes oil or grease spills. What can I do in the bathroom?  Use night lights.  Install grab bars by the toilet and in the tub and shower. Do not use towel bars as grab bars.  Use non-skid mats or decals in the tub or shower.  If you need to sit down in the shower, use a plastic, non-slip stool.  Keep the floor dry. Clean up any water that spills on the floor as soon as it happens.  Remove soap buildup in the tub or shower regularly.  Attach bath mats securely with double-sided non-slip rug tape.  Do not have throw rugs and other things on the floor that can make you trip. What can I do in the bedroom?  Use night lights.  Make sure that you have a light by your bed that is easy to reach.  Do not use any sheets or blankets that are too big for your bed. They should not hang down onto the floor.  Have a firm chair that has side arms. You can use this for support while you get dressed.  Do not have throw rugs  and other things on the floor that can make you trip. What can I do in the kitchen?  Clean up any spills right away.  Avoid walking on wet floors.  Keep items that you use a lot in easy-to-reach places.  If you need to reach something above you, use a strong step stool that has a grab bar.  Keep electrical cords out of the way.  Do not use floor polish or wax that makes floors slippery. If you must use wax, use non-skid floor wax.  Do not have throw rugs and other things on the floor that can make you trip. What can I do with my stairs?  Do not leave any items on the stairs.  Make sure that there are handrails on both sides of the stairs and use them. Fix handrails that are broken or loose. Make sure that handrails are as long as the stairways.  Check any carpeting to make sure that it is  firmly attached to the stairs. Fix any carpet that is loose or worn.  Avoid having throw rugs at the top or bottom of the stairs. If you do have throw rugs, attach them to the floor with carpet tape.  Make sure that you have a light switch at the top of the stairs and the bottom of the stairs. If you do not have them, ask someone to add them for you. What else can I do to help prevent falls?  Wear shoes that:  Do not have high heels.  Have rubber bottoms.  Are comfortable and fit you well.  Are closed at the toe. Do not wear sandals.  If you use a stepladder:  Make sure that it is fully opened. Do not climb a closed stepladder.  Make sure that both sides of the stepladder are locked into place.  Ask someone to hold it for you, if possible.  Clearly mark and make sure that you can see:  Any grab bars or handrails.  First and last steps.  Where the edge of each step is.  Use tools that help you move around (mobility aids) if they are needed. These include:  Canes.  Walkers.  Scooters.  Crutches.  Turn on the lights when you go into a dark area. Replace any light bulbs as soon as they burn out.  Set up your furniture so you have a clear path. Avoid moving your furniture around.  If any of your floors are uneven, fix them.  If there are any pets around you, be aware of where they are.  Review your medicines with your doctor. Some medicines can make you feel dizzy. This can increase your chance of falling. Ask your doctor what other things that you can do to help prevent falls. This information is not intended to replace advice given to you by your health care provider. Make sure you discuss any questions you have with your health care provider. Document Released: 08/20/2009 Document Revised: 03/31/2016 Document Reviewed: 11/28/2014 Elsevier Interactive Patient Education  2017 [employer].

## 2018-03-09 NOTE — Progress Notes (Addendum)
Subjective:   Nicole Contreras is a 72 y.o. female who presents for Medicare Annual (Subsequent) preventive examination.  Review of Systems:  N/A Cardiac Risk Factors include: advanced age (>70men, >88 women);obesity (BMI >30kg/m2);sedentary lifestyle     Objective:     Vitals: BP 122/60 (BP Location: Left Arm, Patient Position: Sitting, Cuff Size: Normal)   Pulse 77   Temp 97.9 F (36.6 C) (Oral)   Resp 12   Ht 5\' 7"  (1.702 m)   Wt 192 lb 6.4 oz (87.3 kg)   SpO2 95%   BMI 30.13 kg/m   Body mass index is 30.13 kg/m.  Advanced Directives 03/09/2018 11/12/2017 08/08/2017 12/01/2016 07/15/2016 05/30/2016 11/30/2015  Does Patient Have a Medical Advance Directive? No No No No No No No  Would patient like information on creating a medical advance directive? Yes (MAU/Ambulatory/Procedural Areas - Information given) - - - Yes - Educational materials given - -    Tobacco Social History   Tobacco Use  Smoking Status Former Smoker  . Packs/day: 0.25  . Years: 10.00  . Pack years: 2.50  . Types: Cigarettes  . Last attempt to quit: 11/07/1990  . Years since quitting: 27.3  Smokeless Tobacco Never Used  Tobacco Comment   smoking cessation materials not required     Counseling given: No Comment: smoking cessation materials not required   Clinical Intake:  Pre-visit preparation completed: Yes  Pain : No/denies pain   BMI - recorded: 30.13 Nutritional Status: BMI > 30  Obese Nutritional Risks: None Diabetes: No  How often do you need to have someone help you when you read instructions, pamphlets, or other written materials from your doctor or pharmacy?: 1 - Never  Interpreter Needed?: No  Information entered by :: AEversole, LPN  Hospitalizations/ED visits and surgeries occurring within the previous 12 months:  Within the previous 12 months, pt has not underwent any surgical procedures. However, within the previous 12 months, pt was seen at [facility] on 11/12/17 for  acute cystitis with hematuria, treated by Dr. Lacinda Axon. Pt was seen in f/u by Dr. Manuella Ghazi on 12/22/17. Planning included:   1. UTI (urinary tract infection) with pyuria Suspect acute cystitis, started on ciprofloxacin for 7 days, obtain urine culture - POCT urinalysis dipstick - Urinalysis, Routine w reflex microscopic - Urine Culture - ciprofloxacin (CIPRO) 500 MG tablet; Take 1 tablet (500 mg total) by mouth 2 (two) times daily for 7 days.  Dispense: 14 tablet; Refill: 0  Past Medical History:  Diagnosis Date  . Atypical chest pain    a. 03/2014 ETT: Ex time 4:10, Max HR 160 bpm, no acute st/t changes.  . Breast cancer ([place])    a. 1986 s/p radiation.  . Bright's disease    Hx  . Diastolic dysfunction    a. 02/2014 Echo: EF 55-60%, no rwma, Gr1 DD.  . Dizziness and giddiness   . Fecal smearing   . GERD (gastroesophageal reflux disease)   . Heart murmur    as child  . Insomnia, unspecified   . Obesity, unspecified   . Personal history of chemotherapy   . Personal history of radiation therapy   . PONV (postoperative nausea and vomiting)    after cataract procedure  . Postmenopausal bleeding   . 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 Reflux esophagitis   . Right cataract    a. 01/2016 s/p cataract surgery.  Marland Kitchen Spasm of muscle   . Unspecified hypothyroidism   . Unspecified urinary  incontinence   . Unspecified vitamin D deficiency    Past Surgical History:  Procedure Laterality Date  . BREAST BIOPSY Left 1990   X 2- neg  . CARDIAC CATHETERIZATION  90's   [facility]; no stent  . CATARACT EXTRACTION W/PHACO Right 02/04/2016   Procedure: CATARACT EXTRACTION PHACO AND INTRAOCULAR LENS PLACEMENT (IOC);  Surgeon: Birder Robson, MD;  Location: [facility] OR[facility];  Service: Ophthalmology;  Laterality: Right;  [place] 00:44 AP% 19.2 CDE 8.49 fluid pack lot # [id number]  . colonoscopy    . COLONOSCOPY WITH PROPOFOL N/A 07/15/2016   Procedure: COLONOSCOPY WITH PROPOFOL;  Surgeon: [name], MD;  Location: [place];  Service: Endoscopy;  Laterality: N/A;  . ENDOMETRIAL BIOPSY    . MASTECTOMY Right 1985  . POLYPECTOMY N/A 07/15/2016   Procedure: POLYPECTOMY;  Surgeon: [name], MD;  Location: [place];  Service: Endoscopy;  Laterality: N/A;   Family History  Problem Relation Age of Onset  . Uterine cancer Mother   . Lung cancer Mother   . Stroke Mother   . Hypertension Mother   . Dementia Mother   . Heart disease Father   . Arrhythmia Father        afib  . Cervical cancer Sister   . Arrhythmia Sister        afib  . Breast cancer Sister   . Breast cancer Paternal Aunt 58   Social History   Socioeconomic History  . Marital status: Married    Spouse name: Quillian Quince  . Number of children: 2  . Years of education: Not on file  . Highest education level: Associate degree: occupational, Hotel manager, or vocational program  Occupational History  . Occupation: Retired  Scientific laboratory technician  . Financial resource strain: Not hard at all  . Food insecurity:    Worry: Never true    Inability: Never true  . Transportation needs:    Medical: No    Non-medical: No  Tobacco Use  . Smoking status: Former Smoker    Packs/day: 0.25    Years: 10.00    Pack years: 2.50    Types: Cigarettes    Last attempt to quit: 11/07/1990    Years since quitting: 27.3  . Smokeless tobacco: Never Used  . Tobacco comment: smoking cessation materials not required  Substance and Sexual Activity  . Alcohol use: No    Alcohol/week: 0.0 oz  . Drug use: No  . Sexual activity: Not Currently  Lifestyle  . Physical activity:    Days per week: 0 days    Minutes per session: 0 min  . Stress: Not at all  Relationships  . Social connections:    Talks on phone: Patient refused    Gets together: Patient refused    Attends religious service: Patient refused    Active member of club or organization: Patient refused    Attends meetings of clubs or organizations: Patient refused    Relationship status:  Married  Other Topics Concern  . Not on file  Social History Narrative  . Not on file    Outpatient Encounter Medications as of 03/09/2018  Medication Sig  . Cholecalciferol (VITAMIN D) 2000 UNITS tablet Take 2,000 Units by mouth daily.  . Clobetasol Propionate (TEMOVATE) 0.05 % external spray Apply topically 2 (two) times daily.  Marland Kitchen levothyroxine (SYNTHROID, LEVOTHROID) 75 MCG tablet Take 1 tablet (75 mcg total) by mouth daily.  . naproxen sodium (ANAPROX) 220 MG tablet Take 220 mg by mouth as needed.  Marland Kitchen  diclofenac (CATAFLAM) 50 MG tablet Take 50 mg by mouth.  . methotrexate 250 MG/10ML injection Inject 250 mg into the muscle once a week.  . [DISCONTINUED] cephALEXin (KEFLEX) 500 MG capsule Take 1 capsule (500 mg total) by mouth 2 (two) times daily. (Patient not taking: Reported on 12/22/2017)  . [DISCONTINUED] TURMERIC PO Take 1 tablet by mouth daily.   No facility-administered encounter medications on file as of 03/09/2018.     Activities of Daily Living In your present state of health, do you have any difficulty performing the following activities: 03/09/2018 08/08/2017  Hearing? N N  Comment denies hearing aids -  Vision? N N  Comment wears reading eyeglasses; cataract L eye -  Difficulty concentrating or making decisions? Y N  Comment short term memory loss r/t stress -  Walking or climbing stairs? Y N  Comment dyspnea -  Dressing or bathing? N N  Doing errands, shopping? N N  Preparing Food and eating ? N -  Comment denies dentures -  Using the Toilet? N -  In the past six months, have you accidently leaked urine? Y -  Comment stress incontinence -  Do you have problems with loss of bowel control? N -  Managing your Medications? N -  Managing your Finances? N -  Housekeeping or managing your Housekeeping? N -  Some recent data might be hidden    Patient Care Team: Steele Sizer, MD as PCP - General (Family Medicine) Emmaline Kluver., MD as Consulting Physician  (Rheumatology) Oneta Rack, MD as Consulting Physician (Dermatology)    Assessment:   This is a routine wellness examination for Nicole Contreras.  Exercise Activities and Dietary recommendations Current Exercise Habits: The patient does not participate in regular exercise at present, Exercise limited by: Other - see comments(RA)  Goals    . DIET - INCREASE WATER INTAKE     Recommend to drink at least 6-8 8oz glasses of water per day.       Fall Risk Fall Risk  03/09/2018 08/08/2017 12/01/2016 11/30/2015 09/14/2015  Falls in the past year? No No No No No  Risk for fall due to : Impaired vision - - - -  Risk for fall due to: Comment wears eyeglasses, L cataract; RA - - - -   Is the home free of loose throw rugs in walkways, pet beds, electrical cords, etc? Yes Adequate lighting to reduce risk of falls?  Yes In addition, does the patient have any of the following: Stairs in or around the home WITH handrails? No Use of a cane, walker or w/c? No Grab bars in the bathroom? Yes  Shower chair or a place to sit while bathing? Yes Use of an elevated toilet seat or a handicapped toilet? Yes  Timed Get Up and Go Performed: Yes. Pt ambulated 10 feet within 6 sec. Gait stead-fast and without the use of an assistive device. No intervention required at this time. Fall risk prevention has been discussed.  Community Resource Referral not required at this time.  Depression Screen PHQ 2/9 Scores 03/09/2018 08/08/2017 12/01/2016 11/30/2015  PHQ - 2 Score 0 0 0 0  PHQ- 9 Score 0 - - -     Cognitive Function     6CIT Screen 03/09/2018  What Year? 0 points  What month? 0 points  What time? 3 points  Count back from 20 0 points  Months in reverse 0 points  Repeat phrase 0 points  Total Score 3  Immunization History  Administered Date(s) Administered  . Influenza, High Dose Seasonal PF 08/08/2017  . Influenza, Seasonal, Injecte, Preservative Fre 08/16/2012, 08/07/2013  . Influenza,inj,Quad PF,6+  Mos 08/04/2014  . Influenza-Unspecified 08/24/2015, 08/18/2016  . Pneumococcal Conjugate-13 09/14/2015  . Pneumococcal Polysaccharide-23 08/19/2013  . Tdap 10/05/2010  . Zoster 04/08/2011    Qualifies for Shingles Vaccine? Yes. Zostavax completed 04/08/11. Due for Shingrix vaccine. Education has been provided regarding the importance of this vaccine. Pt has been advised to call her insurance company to determine her out of pocket expense. Advised she may also receive this vaccine at her local pharmacy or Health Dept. Verbalized acceptance and understanding.  Screening Tests Health Maintenance  Topic Date Due  . MAMMOGRAM  11/29/2017  . INFLUENZA VACCINE  06/07/2018  . PNA vac Low Risk Adult (2 of 2 - PPSV23) 08/19/2018  . TETANUS/TDAP  10/05/2020  . COLONOSCOPY  07/15/2026  . DEXA SCAN  Completed  . Hepatitis C Screening  Completed    Cancer Screenings: Lung: Low Dose CT Chest recommended if Age 6-80 years, 30 pack-year currently smoking OR have quit w/in 15years. Patient does not qualify. Breast:  Up to date on Mammogram? No. Completed 11/29/16. Ordered today. Pt provided with contact information for self scheduling. States she is in the process of moving and will likely wait to schedule until around June/July. Wants to be settled in to her new home.   Up to date of Bone Density/Dexa? Yes. Completed 08/21/13. Osteoporotic screenings no longer required Colorectal: Completed 07/15/16. Repeat every 10 years   Additional Screenings: Hepatitis C Screening: Completed 03/20/13    Plan:  I have personally reviewed and addressed the Medicare Annual Wellness questionnaire and have noted the following in the patient's chart:  A. Medical and social history B. Use of alcohol, tobacco or illicit drugs  C. Current medications and supplements D. Functional ability and status E.  Nutritional status F.  Physical activity G. Advance directives H. List of other physicians I.  Hospitalizations,  surgeries, and ER visits in previous 12 months J.  [place] such as hearing and vision if needed, cognitive and depression L. Referrals and appointments  In addition, I have reviewed and discussed with patient certain preventive protocols, quality metrics, and best practice recommendations. A written personalized care plan for preventive services as well as general preventive health recommendations were provided to patient.  See attached scanned questionnaire for additional information.   Signed,  Aleatha Borer, LPN Nurse Health Advisor   I have reviewed this encounter including the documentation in this note and/or discussed this patient with the Johney Maine, FNP, NP-C. I am certifying that I agree with the content of this note as supervising physician.  Steele Sizer, MD [facility] 03/09/2018, 1:11 PM

## 2018-03-17 ENCOUNTER — Ambulatory Visit
Admission: EM | Admit: 2018-03-17 | Discharge: 2018-03-17 | Disposition: A | Discharge status: Home / Self Care | Payer: Medicare Other | Attending: Emergency Medicine | Admitting: Emergency Medicine

## 2018-03-17 ENCOUNTER — Other Ambulatory Visit: Payer: Self-pay

## 2018-03-17 ENCOUNTER — Encounter: Payer: Self-pay | Admitting: Gynecology

## 2018-03-17 DIAGNOSIS — J22 Unspecified acute lower respiratory infection: Secondary | ICD-10-CM

## 2018-03-17 MED ORDER — IPRATROPIUM-ALBUTEROL 0.5-2.5 (3) MG/3ML IN SOLN
3.0000 mL | Freq: Once | RESPIRATORY_TRACT | Status: AC
  Administered 2018-03-17: 3 mL via RESPIRATORY_TRACT

## 2018-03-17 MED ORDER — FLUTICASONE PROPIONATE 50 MCG/ACT NA SUSP: 2.0000 | Freq: Every day | NASAL | 0 refills | Status: DC

## 2018-03-17 MED ORDER — ALBUTEROL SULFATE HFA 108 (90 BASE) MCG/ACT IN AERS: 1.0000 | INHALATION_SPRAY | Freq: Four times a day (QID) | RESPIRATORY_TRACT | 0 refills | Status: DC | PRN

## 2018-03-17 MED ORDER — AEROCHAMBER PLUS MISC: 2 refills | Status: DC

## 2018-03-17 NOTE — ED Provider Notes (Signed)
HPI  SUBJECTIVE:  Nicole Contreras is a 72 y.o. female who presents with 3 days of chest congestion, tightness.  She reports an occasional nonproductive cough.  Some mild frontal sinus pain and pressure, wheezing with deep inspiration, shortness of breath and dyspnea on exertion.  She states that she feels feverish but has not no documented fevers as she does not have a thermometer available.  No nasal congestion, rhinorrhea, postnasal drip.  No chest pain.  No allergy symptoms.  She does state that her GERD has been bothering her recently.  No antibiotics in the past month.  No antipyretic in the past 6 to 8 hours.  She has tried Robitussin, Advil Cold and Sinus and Tylenol without improvement in her symptoms.  No aggravating factors.  Her symptoms are not associated with going outside or with exertion.  She has a past medical history of psoriatic arthritis, GERD, breast cancer status post chemo and radiation in 1985.  No history of asthma, eczema, COPD, smoking, allergies, diabetes, hypertension.  KVQ:QVZDGL, Drue Stager, MD   Past Medical History:  Diagnosis Date  . Atypical chest pain    a. 03/2014 ETT: Ex time 4:10, Max HR 160 bpm, no acute st/t changes.  . Breast cancer ([place])    a. 1986 s/p radiation.  . Bright's disease    Hx  . Diastolic dysfunction    a. 02/2014 Echo: EF 55-60%, no rwma, Gr1 DD.  . Dizziness and giddiness   . Fecal smearing   . GERD (gastroesophageal reflux disease)   . Heart murmur    as child  . Insomnia, unspecified   . Obesity, unspecified   . Personal history of chemotherapy   . Personal history of radiation therapy   . PONV (postoperative nausea and vomiting)    after cataract procedure  . Postmenopausal bleeding   . 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 Reflux esophagitis   . Right cataract    a. 01/2016 s/p cataract surgery.  Marland Kitchen Spasm of muscle   . Unspecified hypothyroidism   . Unspecified urinary incontinence   . Unspecified vitamin D deficiency     Past  Surgical History:  Procedure Laterality Date  . BREAST BIOPSY Left 1990   X 2- neg  . CARDIAC CATHETERIZATION  90's   [facility]; no stent  . CATARACT EXTRACTION W/PHACO Right 02/04/2016   Procedure: CATARACT EXTRACTION PHACO AND INTRAOCULAR LENS PLACEMENT ([facility]);  Surgeon: Birder Robson, MD;  Location: [facility];  Service: Ophthalmology;  Laterality: Right;  [place] 00:44 AP% 19.2 CDE 8.49 fluid pack lot # [id number]  . colonoscopy    . COLONOSCOPY WITH PROPOFOL N/A 07/15/2016   Procedure: COLONOSCOPY WITH PROPOFOL;  Surgeon: [name], MD;  Location: [place];  Service: Endoscopy;  Laterality: N/A;  . ENDOMETRIAL BIOPSY    . MASTECTOMY Right 1985  . POLYPECTOMY N/A 07/15/2016   Procedure: POLYPECTOMY;  Surgeon: [name], MD;  Location: [place];  Service: Endoscopy;  Laterality: N/A;    Family History  Problem Relation Age of Onset  . Uterine cancer Mother   . Lung cancer Mother   . Stroke Mother   . Hypertension Mother   . Dementia Mother   . Heart disease Father   . Arrhythmia Father        afib  . Cervical cancer Sister   . Arrhythmia Sister        afib  . Breast cancer Sister   . Breast cancer Paternal Aunt 65  Social History   Tobacco Use  . Smoking status: Former Smoker    Packs/day: 0.25    Years: 10.00    Pack years: 2.50    Types: Cigarettes    Last attempt to quit: 11/07/1990    Years since quitting: 27.3  . Smokeless tobacco: Never Used  . Tobacco comment: smoking cessation materials not required  Substance Use Topics  . Alcohol use: No    Alcohol/week: 0.0 oz  . Drug use: No    No current facility-administered medications for this encounter.   Current Outpatient Medications:  .  Cholecalciferol (VITAMIN D) 2000 UNITS tablet, Take 2,000 Units by mouth daily., Disp: , Rfl:  .  Clobetasol Propionate (TEMOVATE) 0.05 % external spray, Apply topically 2 (two) times daily., Disp: 59 mL, Rfl: 0 .  levothyroxine (SYNTHROID, LEVOTHROID) 75 MCG  tablet, Take 1 tablet (75 mcg total) by mouth daily., Disp: 30 tablet, Rfl: 2 .  methotrexate 250 MG/10ML injection, Inject 250 mg into the muscle once a week., Disp: , Rfl:  .  albuterol (PROVENTIL HFA;VENTOLIN HFA) 108 (90 Base) MCG/ACT inhaler, Inhale 1-2 puffs into the lungs every 6 (six) hours as needed for wheezing or shortness of breath., Disp: 1 Inhaler, Rfl: 0 .  fluticasone (FLONASE) 50 MCG/ACT nasal spray, Place 2 sprays into both nostrils daily., Disp: 16 g, Rfl: 0 .  Spacer/Aero-Holding Chambers (AEROCHAMBER PLUS) inhaler, Use as instructed, Disp: 1 each, Rfl: 2  Allergies  Allergen Reactions  . Sulfa Antibiotics Rash     ROS  As noted in HPI.   Physical Exam  BP 140/86 (BP Location: Left Arm)   Pulse 96   Temp 98.9 F (37.2 C) (Oral)   Resp 16   Wt 192 lb (87.1 kg)   SpO2 99%   BMI 30.07 kg/m   Constitutional: Well developed, well nourished, no acute distress Eyes:  EOMI, conjunctiva normal bilaterally HENT: Normocephalic, atraumatic,mucus membranes moist.  No nasal congestion.  Slightly erythematous, swollen turbinates.  No sinus tenderness.  Positive cobblestoning.  No postnasal drip. Respiratory: Normal inspiratory effort, fair air movement, lungs clear bilaterally.  Positive anterior chest wall tenderness Cardiovascular: Normal rate, regular rhythm, no murmurs, rubs, gallops GI: nondistended skin: No rash, skin intact Musculoskeletal: no deformities Neurologic: Alert & oriented x 3, no focal neuro deficits Psychiatric: Speech and behavior appropriate   ED Course   Medications  ipratropium-albuterol (DUONEB) 0.5-2.5 (3) MG/3ML nebulizer solution 3 mL (3 mLs Nebulization Given 03/17/18 1135)    No orders of the defined types were placed in this encounter.   No results found for this or any previous visit (from the past 24 hour(s)). No results found.  ED Clinical Impression  Lower respiratory tract infection   ED Assessment/Plan  Deferring  chest x-ray today in the absence of focal lung findings, fevers, and normal vital signs.  Will give DuoNeb and reevaluate.  GERD may be contributing to her symptoms.  She does not have any sinus tenderness suggestive of a sinusitis.  Presentation is suggestive of a viral lower respiratory tract infection.  On reevaluation, patient states that she feels better, her chest is not as tight.  Her lungs are still clear, but she has improved air movement.  Plan to send home with an albuterol inhaler with a spacer 2 puffs every 4-6 hours, Flonase, start Mucinex, continue Robitussin-DM, follow-up with primary care physician or return here in several days if not getting any better.  Offered patient prescription of Tussionex, but patient declined  today. To the ER if she gets worse.  Discussed  MDM, treatment plan, and plan for follow-up with patient. Discussed sn/sx that should prompt return to the ED. patient agrees with plan.   Meds ordered this encounter  Medications  . ipratropium-albuterol (DUONEB) 0.5-2.5 (3) MG/3ML nebulizer solution 3 mL  . albuterol (PROVENTIL HFA;VENTOLIN HFA) 108 (90 Base) MCG/ACT inhaler    Sig: Inhale 1-2 puffs into the lungs every 6 (six) hours as needed for wheezing or shortness of breath.    Dispense:  1 Inhaler    Refill:  0  . Spacer/Aero-Holding Chambers (AEROCHAMBER PLUS) inhaler    Sig: Use as instructed    Dispense:  1 each    Refill:  2  . fluticasone (FLONASE) 50 MCG/ACT nasal spray    Sig: Place 2 sprays into both nostrils daily.    Dispense:  16 g    Refill:  0    *This clinic note was created using [employer]. Therefore, there may be occasional mistakes despite careful proofreading.   ?    Melynda Ripple, MD 03/17/18 [phone]

## 2018-03-17 NOTE — ED Triage Notes (Signed)
Per patient cough / chest congestion x 3 days.

## 2018-03-17 NOTE — Discharge Instructions (Addendum)
albuterol inhaler with a spacer 2 puffs every 4-6 hours, Flonase, start Mucinex, continue Robitussin-DM, follow-up with primary care physician or return here in several days if not getting any better.

## 2018-04-13 ENCOUNTER — Other Ambulatory Visit: Payer: Self-pay | Admitting: Family Medicine

## 2018-04-13 NOTE — Telephone Encounter (Signed)
Refill request for thyroid medication.    Last follow up on 08/08/2017  Lab Results  Component Value Date   TSH 0.81 08/08/2017     Follow up on 04/17/18

## 2018-04-17 ENCOUNTER — Ambulatory Visit (INDEPENDENT_AMBULATORY_CARE_PROVIDER_SITE_OTHER): Payer: Medicare Other | Admitting: Family Medicine

## 2018-04-17 ENCOUNTER — Telehealth: Payer: Self-pay | Admitting: Family Medicine

## 2018-04-17 ENCOUNTER — Other Ambulatory Visit
Admission: RE | Admit: 2018-04-17 | Discharge: 2018-04-17 | Disposition: A | Discharge status: Home / Self Care | Payer: Medicare Other | Source: Ambulatory Visit | Attending: Family Medicine | Admitting: Family Medicine

## 2018-04-17 ENCOUNTER — Encounter: Payer: Self-pay | Admitting: Family Medicine

## 2018-04-17 VITALS — BP 120/70 | HR 75 | Temp 98.3°F | Resp 16 | Ht 66.54 in | Wt 189.8 lb

## 2018-04-17 DIAGNOSIS — R739 Hyperglycemia, unspecified: Secondary | ICD-10-CM

## 2018-04-17 DIAGNOSIS — R9431 Abnormal electrocardiogram [ECG] [EKG]: Secondary | ICD-10-CM | POA: Insufficient documentation

## 2018-04-17 DIAGNOSIS — E039 Hypothyroidism, unspecified: Secondary | ICD-10-CM

## 2018-04-17 DIAGNOSIS — Z01419 Encounter for gynecological examination (general) (routine) without abnormal findings: Secondary | ICD-10-CM

## 2018-04-17 DIAGNOSIS — R0789 Other chest pain: Secondary | ICD-10-CM | POA: Insufficient documentation

## 2018-04-17 DIAGNOSIS — Z9011 Acquired absence of right breast and nipple: Secondary | ICD-10-CM

## 2018-04-17 DIAGNOSIS — L4050 Arthropathic psoriasis, unspecified: Secondary | ICD-10-CM | POA: Diagnosis not present

## 2018-04-17 LAB — CK: Total CK: 66 U/L (ref 38–234)

## 2018-04-17 LAB — TSH: TSH: 1.288 u[IU]/mL (ref 0.350–4.500)

## 2018-04-17 LAB — TROPONIN I: Troponin I: <0.03 ng/mL (ref <0.03)

## 2018-04-17 LAB — CKMB (ARMC ONLY): CK, MB: 1.8 ng/mL (ref 0.5–5.0)

## 2018-04-17 MED ORDER — LEVOTHYROXINE SODIUM 75 MCG PO TABS: 75.0000 ug | ORAL_TABLET | Freq: Every day | ORAL | 1 refills | Status: DC

## 2018-04-17 NOTE — Telephone Encounter (Signed)
Copied from [place] [id number]. Topic: Inquiry >> Apr 17, 2018  3:05 PM Margot Ables wrote: Reason for CRM: pt states she was to call after 2pm for lab results. She went to the hospital this morning for lab draw. Please call back.

## 2018-04-17 NOTE — Progress Notes (Signed)
Name: Nicole Contreras   MRN: [id number]    DOB: [birth year]   Date:04/17/2018       Progress Note  Subjective  Chief Complaint  Chief Complaint  Patient presents with  . Annual Exam    HPI  Well woman: reviewed notes from [place], LPN that did her medicare wellness, immunization is up to date, but she will check with insurance about shingrix coverage. She will also set up a mammogram. Already ordered. Colonoscopy is up to date. She is physically active, avoiding fast food, trying to eat more fish and greens.   Psoriatic arthritis: she sees Rheumatologist and is supposed to take methotrexate on a regular basis because every time she takes it she has UTI or a cold and is afraid of taking it. Advised her to discuss it with Dr. Jefm Bryant. Taking volaren once a day and sometimes twice daily to control symptoms. She has daily hands pain, and sometimes swelling but no redness.   Psoriasis: she has rash on nuchal area and also elbows, uses topical medication given by dermatologist   Hypothyroidism: taking medication daily without food, no palpitation or dysphagia, weight has been stable  Atypical chest pain: seen by cardiologist twice in the past, she has GERD and studies were negative. She has noticed same pain today, not associated with diaphoresis, not associated with nausea, vomiting or SOB. Advised to take tums and see if symptoms improves, offered EKG and she agrees   Patient Active Problem List   Diagnosis Date Noted  . Internal hemorrhoids 12/01/2016  . Special screening for malignant neoplasms, colon   . Benign neoplasm of transverse colon   . Muscle spasms of neck 11/30/2015  . Numerous moles 11/30/2015  . Gastro-esophageal reflux disease without esophagitis 09/14/2015  . Acquired hypothyroidism 09/14/2015  . Menopause 09/14/2015  . Overweight 09/14/2015  . Hemorrhage, postmenopausal 09/14/2015  . Vitamin D deficiency 09/14/2015  . History of breast cancer 09/14/2015  . Psoriatic  arthritis ([place]) 05/22/2014    Past Surgical History:  Procedure Laterality Date  . BREAST BIOPSY Left 1990   X 2- neg  . CARDIAC CATHETERIZATION  [facility]   [facility]; no stent  . CATARACT EXTRACTION W/PHACO Right 02/04/2016   Procedure: CATARACT EXTRACTION PHACO AND INTRAOCULAR LENS PLACEMENT ([facility]);  Surgeon: Birder Robson, MD;  Location: [facility];  Service: Ophthalmology;  Laterality: Right;  [place] 00:44 AP% 19.2 CDE 8.49 fluid pack lot # [id number]  . colonoscopy    . COLONOSCOPY WITH PROPOFOL N/A 07/15/2016   Procedure: COLONOSCOPY WITH PROPOFOL;  Surgeon: [name], MD;  Location: [place];  Service: Endoscopy;  Laterality: N/A;  . ENDOMETRIAL BIOPSY    . MASTECTOMY Right 1985  . POLYPECTOMY N/A 07/15/2016   Procedure: POLYPECTOMY;  Surgeon: [name], MD;  Location: [place];  Service: Endoscopy;  Laterality: N/A;    Family History  Problem Relation Age of Onset  . Uterine cancer Mother   . Lung cancer Mother   . Stroke Mother   . Hypertension Mother   . Dementia Mother   . Heart disease Father   . Arrhythmia Father        afib  . Cervical cancer Sister   . Arrhythmia Sister        afib  . Breast cancer Sister   . Breast cancer Paternal Aunt 60    Social History   Socioeconomic History  . Marital status: Married    Spouse name: Quillian Quince  . Number of children: 2  .  Years of education: Not on file  . Highest education level: Associate degree: occupational, Hotel manager, or vocational program  Occupational History  . Occupation: Retired  Scientific laboratory technician  . Financial resource strain: Not hard at all  . Food insecurity:    Worry: Never true    Inability: Never true  . Transportation needs:    Medical: No    Non-medical: No  Tobacco Use  . Smoking status: Former Smoker    Packs/day: 0.25    Years: 10.00    Pack years: 2.50    Types: Cigarettes    Last attempt to quit: 11/07/1990    Years since quitting: 27.4  . Smokeless tobacco: Never Used  .  Tobacco comment: smoking cessation materials not required  Substance and Sexual Activity  . Alcohol use: No    Alcohol/week: 0.0 oz  . Drug use: No  . Sexual activity: Not Currently  Lifestyle  . Physical activity:    Days per week: 0 days    Minutes per session: 0 min  . Stress: Not at all  Relationships  . Social connections:    Talks on phone: Patient refused    Gets together: Patient refused    Attends religious service: Patient refused    Active member of club or organization: Patient refused    Attends meetings of clubs or organizations: Patient refused    Relationship status: Married  . Intimate partner violence:    Fear of current or ex partner: No    Emotionally abused: No    Physically abused: No    Forced sexual activity: No  Other Topics Concern  . Not on file  Social History Narrative  . Not on file     Current Outpatient Medications:  .  Cholecalciferol (VITAMIN D) 2000 UNITS tablet, Take 2,000 Units by mouth daily., Disp: , Rfl:  .  Clobetasol Propionate (TEMOVATE) 0.05 % external spray, Apply topically 2 (two) times daily., Disp: 59 mL, Rfl: 0 .  diclofenac (VOLTAREN) 50 MG EC tablet, Take by mouth., Disp: , Rfl:  .  levothyroxine (SYNTHROID, LEVOTHROID) 75 MCG tablet, Take 1 tablet (75 mcg total) by mouth daily., Disp: 30 tablet, Rfl: 0 .  methotrexate 250 MG/10ML injection, Inject 250 mg into the muscle once a week., Disp: , Rfl:   Allergies  Allergen Reactions  . Sulfa Antibiotics Rash     ROS  Constitutional: Negative for fever or weight change.  Respiratory: Negative for cough and shortness of breath.   Cardiovascular: Positive  for intermittent chest pain but  palpitations.  Gastrointestinal: Negative for abdominal pain, no bowel changes.  Musculoskeletal: negative   for gait problem but no  joint swelling.  Skin: Negative for rash.  Neurological: Negative for dizziness or headache.  No other specific complaints in a complete review of  systems (except as listed in HPI above).  Objective  Vitals:   04/17/18 0937  BP: 120/70  Pulse: 75  Resp: 16  Temp: 98.3 F (36.8 C)  TempSrc: Oral  SpO2: 97%  Weight: 189 lb 12.8 oz (86.1 kg)  Height: 5' 6.54" (1.69 m)    Body mass index is 30.14 kg/m.  Physical Exam  Constitutional: Patient appears well-developed and well-nourished. Obese No distress.  HEENT: head atraumatic, normocephalic, pupils equal and reactive to light,  neck supple, throat within normal limits Cardiovascular: Normal rate, regular rhythm and normal heart sounds.  No murmur heard. No BLE edema. Breast: right mastectomy, normal left breast exam Pulmonary/Chest: Effort normal and  breath sounds normal. No respiratory distress. Abdominal: Soft.  There is no tenderness. Psychiatric: Patient has a normal mood and affect. behavior is normal. Judgment and thought content normal.  PHQ2/9: Depression screen [facility] 2/9 04/17/2018 03/09/2018 08/08/2017 12/01/2016 11/30/2015  Decreased Interest 0 0 0 0 0  Down, Depressed, Hopeless 0 0 0 0 0  PHQ - 2 Score 0 0 0 0 0  Altered sleeping - 0 - - -  Tired, decreased energy - 0 - - -  Change in appetite - 0 - - -  Feeling bad or failure about yourself  - 0 - - -  Trouble concentrating - 0 - - -  Moving slowly or fidgety/restless - 0 - - -  Suicidal thoughts - 0 - - -  PHQ-9 Score - 0 - - -  Difficult doing work/chores - Not difficult at all - - -     Fall Risk: Fall Risk  04/17/2018 03/09/2018 08/08/2017 12/01/2016 11/30/2015  Falls in the past year? No No No No No  Risk for fall due to : - Impaired vision - - -  Risk for fall due to: Comment - wears eyeglasses, L cataract; RA - - -     Functional Status Survey: Is the patient deaf or have difficulty hearing?: No Does the patient have difficulty seeing, even when wearing glasses/contacts?: No Does the patient have difficulty concentrating, remembering, or making decisions?: No Does the patient have difficulty walking  or climbing stairs?: No Does the patient have difficulty dressing or bathing?: No Does the patient have difficulty doing errands alone such as visiting a doctor's office or shopping?: No    Assessment & Plan  1. Well woman exam  Discussed importance of 150 minutes of physical activity weekly, eat two servings of fish weekly, eat one serving of tree nuts ( cashews, pistachios, pecans, almonds.Marland Kitchen) every other day, eat 6 servings of fruit/vegetables daily and drink plenty of water and avoid sweet beverages.   2. Acquired hypothyroidism  - levothyroxine (SYNTHROID, LEVOTHROID) 75 MCG tablet; Take 1 tablet (75 mcg total) by mouth daily.  Dispense: 90 tablet; Refill: 1 - TSH  3. Psoriatic arthritis ([place])  Not compliant with methotrexate  4. Hyperglycemia  Normal last hgbA1C  5. 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 Jefm Bryant   7. Atypical chest pain  Try tums  - EKG 12-Lead  7. Atypical chest pain  - EKG 12-Lead - changes with atypical chest pain, normal stress test in the past. We will check cardiac enzymes and send copy of EKG to Dr. Fletcher Anon, patient advised to go to [facility] if symptoms gets worse and call us by 2 pmif she does nto hear from results - Troponin I - CK Total (and CKMB)  8. EKG abnormalities  - Troponin I - CK Total (and CKMB)

## 2018-04-18 ENCOUNTER — Other Ambulatory Visit: Payer: Self-pay | Admitting: Family Medicine

## 2018-04-18 DIAGNOSIS — Z1231 Encounter for screening mammogram for malignant neoplasm of breast: Secondary | ICD-10-CM

## 2018-04-18 NOTE — Telephone Encounter (Signed)
This message should have been given to me in person. I just saw results this am. Sent message to [place] to contact patient

## 2018-05-08 ENCOUNTER — Ambulatory Visit
Admission: RE | Admit: 2018-05-08 | Discharge: 2018-05-08 | Disposition: A | Discharge status: Home / Self Care | Payer: Medicare Other | Source: Ambulatory Visit | Attending: Family Medicine | Admitting: Family Medicine

## 2018-05-08 DIAGNOSIS — Z1231 Encounter for screening mammogram for malignant neoplasm of breast: Secondary | ICD-10-CM | POA: Diagnosis present

## 2018-06-04 ENCOUNTER — Encounter: Payer: Self-pay | Admitting: *Deleted

## 2018-06-12 ENCOUNTER — Encounter: Payer: Self-pay | Admitting: Emergency Medicine

## 2018-06-12 ENCOUNTER — Other Ambulatory Visit: Payer: Self-pay

## 2018-06-12 ENCOUNTER — Encounter
Admission: RE | Disposition: A | Discharge status: Home / Self Care | Payer: Self-pay | Source: Ambulatory Visit | Attending: Ophthalmology

## 2018-06-12 ENCOUNTER — Ambulatory Visit
Admission: RE | Admit: 2018-06-12 | Discharge: 2018-06-12 | Disposition: A | Discharge status: Home / Self Care | Payer: Medicare Other | Source: Ambulatory Visit | Attending: Ophthalmology | Admitting: Ophthalmology

## 2018-06-12 ENCOUNTER — Ambulatory Visit: Payer: Medicare Other | Admitting: Certified Registered Nurse Anesthetist

## 2018-06-12 DIAGNOSIS — Z853 Personal history of malignant neoplasm of breast: Secondary | ICD-10-CM | POA: Diagnosis not present

## 2018-06-12 DIAGNOSIS — R011 Cardiac murmur, unspecified: Secondary | ICD-10-CM | POA: Diagnosis not present

## 2018-06-12 DIAGNOSIS — K219 Gastro-esophageal reflux disease without esophagitis: Secondary | ICD-10-CM | POA: Insufficient documentation

## 2018-06-12 DIAGNOSIS — Z79899 Other long term (current) drug therapy: Secondary | ICD-10-CM | POA: Diagnosis not present

## 2018-06-12 DIAGNOSIS — E039 Hypothyroidism, unspecified: Secondary | ICD-10-CM | POA: Insufficient documentation

## 2018-06-12 DIAGNOSIS — Z882 Allergy status to sulfonamides status: Secondary | ICD-10-CM | POA: Diagnosis not present

## 2018-06-12 DIAGNOSIS — Z9011 Acquired absence of right breast and nipple: Secondary | ICD-10-CM | POA: Diagnosis not present

## 2018-06-12 DIAGNOSIS — I10 Essential (primary) hypertension: Secondary | ICD-10-CM | POA: Insufficient documentation

## 2018-06-12 DIAGNOSIS — H2512 Age-related nuclear cataract, left eye: Secondary | ICD-10-CM | POA: Insufficient documentation

## 2018-06-12 DIAGNOSIS — E1136 Type 2 diabetes mellitus with diabetic cataract: Secondary | ICD-10-CM | POA: Diagnosis not present

## 2018-06-12 DIAGNOSIS — Z87891 Personal history of nicotine dependence: Secondary | ICD-10-CM | POA: Diagnosis not present

## 2018-06-12 DIAGNOSIS — L4050 Arthropathic psoriasis, unspecified: Secondary | ICD-10-CM | POA: Insufficient documentation

## 2018-06-12 HISTORY — DX: Unspecified osteoarthritis, unspecified site: M19.90

## 2018-06-12 HISTORY — PX: CATARACT EXTRACTION W/PHACO: SHX586

## 2018-06-12 SURGERY — PHACOEMULSIFICATION, CATARACT, WITH IOL INSERTION
Anesthesia: Monitor Anesthesia Care | Site: Eye | Laterality: Left | Wound class: "Clean "

## 2018-06-12 MED ORDER — PHENYLEPHRINE HCL 10 % OP SOLN
1.0000 [drp] | OPHTHALMIC | Status: DC
  Administered 2018-06-12 (×3): 1 [drp] via OPHTHALMIC

## 2018-06-12 MED ORDER — SODIUM CHLORIDE 0.9 % IV SOLN
INTRAVENOUS | Status: DC
  Administered 2018-06-12: 10:00:00 via INTRAVENOUS

## 2018-06-12 MED ORDER — NA CHONDROIT SULF-NA HYALURON 40-17 MG/ML IO SOLN
INTRAOCULAR | Status: AC
  Filled 2018-06-12: qty 1

## 2018-06-12 MED ORDER — MOXIFLOXACIN HCL 0.5 % OP SOLN
OPHTHALMIC | Status: AC
  Filled 2018-06-12: qty 3

## 2018-06-12 MED ORDER — EPINEPHRINE PF 1 MG/ML IJ SOLN
INTRAMUSCULAR | Status: AC
  Filled 2018-06-12: qty 2

## 2018-06-12 MED ORDER — MIDAZOLAM HCL 2 MG/2ML IJ SOLN
INTRAMUSCULAR | Status: AC
  Filled 2018-06-12: qty 2

## 2018-06-12 MED ORDER — MIDAZOLAM HCL 2 MG/2ML IJ SOLN
INTRAMUSCULAR | Status: DC | PRN
  Administered 2018-06-12 (×2): 1 mg via INTRAVENOUS

## 2018-06-12 MED ORDER — MOXIFLOXACIN HCL 0.5 % OP SOLN
OPHTHALMIC | Status: DC | PRN
  Administered 2018-06-12: 0.2 mL via OPHTHALMIC

## 2018-06-12 MED ORDER — TETRACAINE HCL 0.5 % OP SOLN
OPHTHALMIC | Status: AC
  Administered 2018-06-12: 10:00:00
  Filled 2018-06-12: qty 4

## 2018-06-12 MED ORDER — MOXIFLOXACIN HCL 0.5 % OP SOLN: 1.0000 [drp] | OPHTHALMIC | Status: DC | PRN

## 2018-06-12 MED ORDER — POVIDONE-IODINE 5 % OP SOLN
OPHTHALMIC | Status: AC
  Filled 2018-06-12: qty 30

## 2018-06-12 MED ORDER — PHENYLEPHRINE HCL 10 % OP SOLN
OPHTHALMIC | Status: AC
  Filled 2018-06-12: qty 5

## 2018-06-12 MED ORDER — POVIDONE-IODINE 5 % OP SOLN
OPHTHALMIC | Status: DC | PRN
  Administered 2018-06-12: 1 via OPHTHALMIC

## 2018-06-12 MED ORDER — CARBACHOL 0.01 % IO SOLN
INTRAOCULAR | Status: DC | PRN
  Administered 2018-06-12: 0.5 mL via INTRAOCULAR

## 2018-06-12 MED ORDER — CYCLOPENTOLATE HCL 2 % OP SOLN
1.0000 [drp] | OPHTHALMIC | Status: DC
  Administered 2018-06-12 (×3): 1 [drp] via OPHTHALMIC

## 2018-06-12 MED ORDER — NA CHONDROIT SULF-NA HYALURON 40-17 MG/ML IO SOLN
INTRAOCULAR | Status: DC | PRN
  Administered 2018-06-12: 1 mL via INTRAOCULAR

## 2018-06-12 MED ORDER — ONDANSETRON HCL 4 MG/2ML IJ SOLN: 4.0000 mg | Freq: Once | INTRAMUSCULAR | Status: DC | PRN

## 2018-06-12 MED ORDER — FENTANYL CITRATE (PF) 100 MCG/2ML IJ SOLN: 25.0000 ug | INTRAMUSCULAR | Status: DC | PRN

## 2018-06-12 MED ORDER — TETRACAINE HCL 0.5 % OP SOLN
1.0000 [drp] | Freq: Once | OPHTHALMIC | Status: AC
  Administered 2018-06-12 (×2): 1 [drp] via OPHTHALMIC

## 2018-06-12 MED ORDER — LIDOCAINE HCL (PF) 4 % IJ SOLN
INTRAOCULAR | Status: DC | PRN
  Administered 2018-06-12: 4 mL via OPHTHALMIC

## 2018-06-12 MED ORDER — LIDOCAINE HCL (PF) 4 % IJ SOLN
INTRAMUSCULAR | Status: AC
  Filled 2018-06-12: qty 5

## 2018-06-12 MED ORDER — CYCLOPENTOLATE HCL 2 % OP SOLN
OPHTHALMIC | Status: AC
  Filled 2018-06-12: qty 2

## 2018-06-12 MED ORDER — EPINEPHRINE PF 1 MG/ML IJ SOLN
INTRAOCULAR | Status: DC | PRN
  Administered 2018-06-12: 11:00:00 via OPHTHALMIC

## 2018-06-12 SURGICAL SUPPLY — 16 items
GLOVE BIO SURGEON STRL SZ8 (GLOVE) ×3 IMPLANT
GLOVE BIOGEL M 6.5 STRL (GLOVE) ×3 IMPLANT
GLOVE SURG LX 8.0 MICRO (GLOVE) ×2
GLOVE SURG LX STRL 8.0 MICRO (GLOVE) ×1 IMPLANT
GOWN STRL REUS W/ TWL LRG LVL3 (GOWN DISPOSABLE) ×2 IMPLANT
GOWN STRL REUS W/TWL LRG LVL3 (GOWN DISPOSABLE) ×4
LABEL CATARACT MEDS ST (LABEL) ×3 IMPLANT
LENS IOL TECNIS ITEC 23.0 (Intraocular Lens) ×2 IMPLANT
PACK CATARACT (MISCELLANEOUS) ×3 IMPLANT
PACK CATARACT BRASINGTON LX (MISCELLANEOUS) ×3 IMPLANT
PACK EYE AFTER SURG (MISCELLANEOUS) ×3 IMPLANT
SOL BSS BAG (MISCELLANEOUS) ×3
SOLUTION BSS BAG (MISCELLANEOUS) ×1 IMPLANT
SYR 5ML LL (SYRINGE) ×3 IMPLANT
WATER STERILE IRR 250ML POUR (IV SOLUTION) ×3 IMPLANT
WIPE NON LINTING 3.25X3.25 (MISCELLANEOUS) ×3 IMPLANT

## 2018-06-12 NOTE — H&P (Signed)
All labs reviewed. Abnormal studies sent to patients PCP when indicated.  Previous H&P reviewed, patient examined, there are NO CHANGES.  Nicole Swaim Porfilio8/6/201910:23 AM

## 2018-06-12 NOTE — Anesthesia Postprocedure Evaluation (Signed)
Anesthesia Post Note  Patient: Nicole Contreras  Procedure(s) Performed: CATARACT EXTRACTION PHACO AND INTRAOCULAR LENS PLACEMENT ([place]) (Left Eye)  Patient location during evaluation: Short Stay Anesthesia Type: MAC Level of consciousness: awake and alert, patient cooperative and oriented Pain management: satisfactory to patient Vital Signs Assessment: post-procedure vital signs reviewed and stable Respiratory status: spontaneous breathing, nonlabored ventilation and respiratory function stable Cardiovascular status: blood pressure returned to baseline Postop Assessment: no headache and no apparent nausea or vomiting Anesthetic complications: no     Last Vitals:  Vitals:   06/12/18 1051 06/12/18 1054  BP: 138/72 (!) 146/81  Pulse: 76   Resp: 16 16  Temp: 36.5 C   SpO2: 98% 99%    Last Pain:  Vitals:   06/12/18 1051  TempSrc:   PainSc: 0-No pain                 Eben Burow

## 2018-06-12 NOTE — Anesthesia Preprocedure Evaluation (Signed)
Anesthesia Evaluation  Patient identified by MRN, date of birth, ID band Patient awake    Reviewed: Allergy & Precautions, H&P , NPO status , Patient's Chart, lab work & pertinent test results, reviewed documented beta blocker date and time   History of Anesthesia Complications (+) PONV and history of anesthetic complications  Airway Mallampati: II  TM Distance: >3 FB Neck ROM: full    Dental no notable dental hx. (+) Teeth Intact   Pulmonary neg pulmonary ROS, former smoker,    Pulmonary exam normal breath sounds clear to auscultation       Cardiovascular Exercise Tolerance: Good hypertension, On Medications negative cardio ROS  + Valvular Problems/Murmurs  Rhythm:regular Rate:Normal     Neuro/Psych  Neuromuscular disease negative neurological ROS  negative psych ROS   GI/Hepatic negative GI ROS, Neg liver ROS, GERD  Medicated,  Endo/Other  negative endocrine ROSdiabetesHypothyroidism   Renal/GU Renal disease     Musculoskeletal   Abdominal   Peds  Hematology negative hematology ROS (+)   Anesthesia Other Findings   Reproductive/Obstetrics negative OB ROS                             Anesthesia Physical Anesthesia Plan  ASA: III  Anesthesia Plan: MAC   Post-op Pain Management:    Induction:   PONV Risk Score and Plan:   Airway Management Planned:   Additional Equipment:   Intra-op Plan:   Post-operative Plan:   Informed Consent: I have reviewed the patients History and Physical, chart, labs and discussed the procedure including the risks, benefits and alternatives for the proposed anesthesia with the patient or authorized representative who has indicated his/her understanding and acceptance.     Plan Discussed with: CRNA  Anesthesia Plan Comments:         Anesthesia Quick Evaluation

## 2018-06-12 NOTE — Op Note (Signed)
PREOPERATIVE DIAGNOSIS:  Nuclear sclerotic cataract of the left eye.   POSTOPERATIVE DIAGNOSIS:  Nuclear sclerotic cataract of the left eye.   OPERATIVE PROCEDURE: Procedure(s): CATARACT EXTRACTION PHACO AND INTRAOCULAR LENS PLACEMENT ([facility])   SURGEON:  Birder Robson, MD.   ANESTHESIA:  Anesthesiologist: Molli Barrows, MD CRNA: Eben Burow, CRNA  1.      Managed anesthesia care. 2.     0.70ml of Shugarcaine was instilled following the paracentesis   COMPLICATIONS:  None.   TECHNIQUE:   Stop and chop   DESCRIPTION OF PROCEDURE:  The patient was examined and consented in the preoperative holding area where the aforementioned topical anesthesia was applied to the left eye and then brought back to the Operating Room where the left eye was prepped and draped in the usual sterile ophthalmic fashion and a lid speculum was placed. A paracentesis was created with the side port blade and the anterior chamber was filled with viscoelastic. A near clear corneal incision was performed with the steel keratome. A continuous curvilinear capsulorrhexis was performed with a cystotome followed by the capsulorrhexis forceps. Hydrodissection and hydrodelineation were carried out with BSS on a blunt cannula. The lens was removed in a stop and chop  technique and the remaining cortical material was removed with the irrigation-aspiration handpiece. The capsular bag was inflated with viscoelastic and the Technis [id number] lens was placed in the capsular bag without complication. The remaining viscoelastic was removed from the eye with the irrigation-aspiration handpiece. The wounds were hydrated. The anterior chamber was flushed with Miostat and the eye was inflated to physiologic pressure. 0.37ml Vigamox was placed in the anterior chamber. The wounds were found to be water tight. The eye was dressed with Vigamox. The patient was given protective glasses to wear throughout the day and a shield with which to sleep  tonight. The patient was also given drops with which to begin a drop regimen today and will follow-up with me in one day. Implant Name Type Inv. Item Serial No. Manufacturer Lot No. LRB No. Used  LENS IOL DIOP 23.0 - [id number] Intraocular Lens LENS IOL DIOP 23.0 [id number]  Left 1    Procedure(s) with comments: CATARACT EXTRACTION PHACO AND INTRAOCULAR LENS PLACEMENT ([facility]) (Left) - [place] 00:56.8 AP% 15.8 CDE 8.95 Fluid Pack lot # [id number]  Electronically signed: Birder Robson 06/12/2018 10:50 AM

## 2018-06-12 NOTE — Discharge Instructions (Signed)
Eye Surgery Discharge Instructions    Expect mild scratchy sensation or mild soreness. DO NOT RUB YOUR EYE!  The day of surgery:  Minimal physical activity, but bed rest is not required  No reading, computer work, or close hand work  No bending, lifting, or straining.  May watch TV  For 24 hours:  No driving, legal decisions, or alcoholic beverages  Safety precautions  Eat anything you prefer: It is better to start with liquids, then soup then solid foods.  _____ Eye patch should be worn until postoperative exam tomorrow.  ____ Solar shield eyeglasses should be worn for comfort in the sunlight/patch while sleeping  Resume all regular medications including aspirin or Coumadin if these were discontinued prior to surgery. You may shower, bathe, shave, or wash your hair. Tylenol may be taken for mild discomfort.  Call your doctor if you experience significant pain, nausea, or vomiting, fever > 101 or other signs of infection. [phone] or [phone] Specific instructions:  Follow-up Information    Birder Robson, MD Follow up.   Specialty:  Ophthalmology Why:  August 7 at 9:45am Contact information: [address] [place] [address] [phone]

## 2018-06-12 NOTE — Anesthesia Post-op Follow-up Note (Signed)
Anesthesia QCDR form completed.        

## 2018-06-12 NOTE — Transfer of Care (Signed)
Immediate Anesthesia Transfer of Care Note  Patient: Nicole Contreras  Procedure(s) Performed: CATARACT EXTRACTION PHACO AND INTRAOCULAR LENS PLACEMENT (IOC) (Left Eye)  Patient Location: Short Stay  Anesthesia Type:MAC  Level of Consciousness: awake, alert , oriented and patient cooperative  Airway & Oxygen Therapy: Patient Spontanous Breathing  Post-op Assessment: Report given to RN and Post -op Vital signs reviewed and stable  Post vital signs: Reviewed and stable  Last Vitals:  Vitals Value Taken Time  BP    Temp    Pulse    Resp    SpO2      Last Pain:  Vitals:   06/12/18 1000  TempSrc: Oral  PainSc: 0-No pain         Complications: No apparent anesthesia complications

## 2018-10-17 ENCOUNTER — Ambulatory Visit: Payer: Medicare Other | Admitting: Family Medicine

## 2018-11-21 ENCOUNTER — Other Ambulatory Visit: Payer: Self-pay | Admitting: Family Medicine

## 2018-11-21 DIAGNOSIS — E039 Hypothyroidism, unspecified: Secondary | ICD-10-CM

## 2018-11-21 NOTE — Telephone Encounter (Signed)
Refill request for thyroid medication: Levothyroxine 75 mg  Last Physical: 03/09/2018  Lab Results  Component Value Date   TSH 1.288 04/17/2018    Follow-ups on file. 03/14/2019

## 2018-11-21 NOTE — Telephone Encounter (Signed)
She is due for follow up, can she come in on Monday?

## 2018-11-21 NOTE — Telephone Encounter (Signed)
Pt could not come in on Monday due to Bible Study at church. She scheduled with Raquel Sarna for Tues.

## 2018-11-27 ENCOUNTER — Encounter: Payer: Self-pay | Admitting: Family Medicine

## 2018-11-27 ENCOUNTER — Ambulatory Visit: Payer: Medicare Other | Admitting: Family Medicine

## 2018-11-27 VITALS — BP 110/72 | HR 92 | Temp 97.8°F | Resp 16 | Ht 67.0 in | Wt 186.8 lb

## 2018-11-27 DIAGNOSIS — R0789 Other chest pain: Secondary | ICD-10-CM

## 2018-11-27 DIAGNOSIS — L4050 Arthropathic psoriasis, unspecified: Secondary | ICD-10-CM | POA: Diagnosis not present

## 2018-11-27 DIAGNOSIS — Z9011 Acquired absence of right breast and nipple: Secondary | ICD-10-CM | POA: Diagnosis not present

## 2018-11-27 DIAGNOSIS — E039 Hypothyroidism, unspecified: Secondary | ICD-10-CM | POA: Diagnosis not present

## 2018-11-27 LAB — TSH: TSH: 1.7 m[IU]/L (ref 0.40–4.50)

## 2018-11-27 NOTE — Progress Notes (Signed)
Name: Nicole Contreras   MRN: [id number]    DOB: Dec 18, 1945   Date:11/27/2018       Progress Note  Subjective  Chief Complaint  Chief Complaint  Patient presents with  . Hypothyroidism    follow up, medication refills    HPI  Psoriatic arthritis: she sees Rheumatologist (Dr. Jefm Bryant) and is supposed to have started Humira, but has been putting it off because she if afraid of SE's.  She is not using methotrexate injections because she says she always gets an infection (URI, UTI, etc.) after injection.  She is trying to work on her symptoms with diet (56-day diet - cuts out sugar and processed foods to help with inflammation).   Taking voltaren PRN - tries to take tylenol first. She has hand and foot pain - worse first thing in the morning, but has gotten better with new diet - sometimes swelling but no redness.   Psoriasis: She has rash on nuchal area and also elbows, uses topical medication given by dermatologist   Hypothyroidism: taking medication daily without food - no missed doses, no palpitation or dysphagia, weight has been stable, no heat/cold intolerance, no hair/skin/nail changes. Does endorse some constipation. Will check labs today.   Atypical chest pain: seen by cardiologist twice in the past, she has GERD and studies were negative. She had cardiac work up at last visit and it was negative.  She has not been having chest pain since then.   Hx breast cancer/mastectomy: Had total RIGHT mastectomy in 1985.  Has had several biopsies on the LEFT breast, but no cancer recurrence. Due mammogram in July 2020.  Patient Active Problem List   Diagnosis Date Noted  . Internal hemorrhoids 12/01/2016  . Special screening for malignant neoplasms, colon   . Benign neoplasm of transverse colon   . Muscle spasms of neck 11/30/2015  . Numerous moles 11/30/2015  . Gastro-esophageal reflux disease without esophagitis 09/14/2015  . Acquired hypothyroidism 09/14/2015  . Menopause  09/14/2015  . Overweight 09/14/2015  . Hemorrhage, postmenopausal 09/14/2015  . Vitamin D deficiency 09/14/2015  . History of breast cancer 09/14/2015  . Psoriatic arthritis ([place]) 05/22/2014    Past Surgical History:  Procedure Laterality Date  . BREAST BIOPSY Left 1990   X 2- neg  . CARDIAC CATHETERIZATION  90's   [facility]; no stent  . CATARACT EXTRACTION W/PHACO Right 02/04/2016   Procedure: CATARACT EXTRACTION PHACO AND INTRAOCULAR LENS PLACEMENT (IOC);  Surgeon: Birder Robson, MD;  Location: [facility];  Service: Ophthalmology;  Laterality: Right;  [place] 00:44 AP% 19.2 CDE 8.49 fluid pack lot # [id number]  . CATARACT EXTRACTION W/PHACO Left 06/12/2018   Procedure: CATARACT EXTRACTION PHACO AND INTRAOCULAR LENS PLACEMENT (IOC);  Surgeon: Birder Robson, MD;  Location: [facility];  Service: Ophthalmology;  Laterality: Left;  [place] 00:56.8 AP% 15.8 CDE 8.95 Fluid Pack lot # [id number]  . colonoscopy    . COLONOSCOPY WITH PROPOFOL N/A 07/15/2016   Procedure: COLONOSCOPY WITH PROPOFOL;  Surgeon: [name], MD;  Location: [facility];  Service: Endoscopy;  Laterality: N/A;  . ENDOMETRIAL BIOPSY    . MASTECTOMY Right 1985  . POLYPECTOMY N/A 07/15/2016   Procedure: POLYPECTOMY;  Surgeon: [name], MD;  Location: [facility];  Service: Endoscopy;  Laterality: N/A;    Family History  Problem Relation Age of Onset  . Uterine cancer Mother   . Lung cancer Mother   . Stroke Mother   . Hypertension Mother   . Dementia Mother   .  Heart disease Father   . Arrhythmia Father        afib  . Cervical cancer Sister   . Arrhythmia Sister        afib  . Breast cancer Sister   . Breast cancer Paternal Aunt 70    Social History   Socioeconomic History  . Marital status: Married    Spouse name: Quillian Quince  . Number of children: 2  . Years of education: Not on file  . Highest education level: Associate degree: occupational, Hotel manager, or vocational program  Occupational History  .  Occupation: Retired  Scientific laboratory technician  . Financial resource strain: Not hard at all  . Food insecurity:    Worry: Never true    Inability: Never true  . Transportation needs:    Medical: No    Non-medical: No  Tobacco Use  . Smoking status: Former Smoker    Packs/day: 0.25    Years: 10.00    Pack years: 2.50    Types: Cigarettes    Last attempt to quit: 11/07/1990    Years since quitting: 28.0  . Smokeless tobacco: Never Used  . Tobacco comment: smoking cessation materials not required  Substance and Sexual Activity  . Alcohol use: No    Alcohol/week: 0.0 standard drinks  . Drug use: No  . Sexual activity: Not Currently  Lifestyle  . Physical activity:    Days per week: 0 days    Minutes per session: 0 min  . Stress: Not at all  Relationships  . Social connections:    Talks on phone: Patient refused    Gets together: Patient refused    Attends religious service: Patient refused    Active member of club or organization: Patient refused    Attends meetings of clubs or organizations: Patient refused    Relationship status: Married  . Intimate partner violence:    Fear of current or ex partner: No    Emotionally abused: No    Physically abused: No    Forced sexual activity: No  Other Topics Concern  . Not on file  Social History Narrative  . Not on file     Current Outpatient Medications:  .  cholecalciferol (VITAMIN D) 1000 units tablet, Take 1,000 Units by mouth daily. , Disp: , Rfl:  .  Clobetasol Propionate (TEMOVATE) 0.05 % external spray, Apply topically 2 (two) times daily. (Patient taking differently: Apply 1 application topically 2 (two) times daily as needed (for psoriasis). ), Disp: 59 mL, Rfl: 0 .  Cyanocobalamin (VITAMIN B-12) 5000 MCG TBDP, Take 5,000 mcg by mouth daily., Disp: , Rfl:  .  diclofenac (VOLTAREN) 50 MG EC tablet, Take 50 mg by mouth 2 (two) times daily as needed for moderate pain. , Disp: , Rfl:  .  levothyroxine (SYNTHROID, LEVOTHROID) 75 MCG  tablet, Take 1 tablet (75 mcg total) by mouth daily., Disp: 30 tablet, Rfl: 0 .  MAGNESIUM PO, Take 2 each by mouth daily as needed (for stress)., Disp: , Rfl:  .  methotrexate 250 MG/10ML injection, Inject 22.5 mg into the muscle See admin instructions. Inject 22.5 mg IM weekly if needed for inflammation., Disp: , Rfl:   Allergies  Allergen Reactions  . Sulfa Antibiotics Rash    I personally reviewed active problem list, medication list, allergies, notes from last encounter, lab results with the patient/caregiver today.   ROS Constitutional: Negative for fever or weight change.  Respiratory: Negative for cough and shortness of breath.   Cardiovascular: Negative  for chest pain or palpitations.  Gastrointestinal: Negative for abdominal pain, no bowel changes.  Musculoskeletal: Negative for gait problem or joint swelling.  Skin: Negative for rash.  Neurological: Negative for dizziness or headache.  No other specific complaints in a complete review of systems (except as listed in HPI above).  Objective  Vitals:   11/27/18 1031  BP: 110/72  Pulse: 92  Resp: 16  Temp: 97.8 F (36.6 C)  TempSrc: Oral  SpO2: 95%  Weight: 186 lb 12.8 oz (84.7 kg)  Height: 5\' 7"  (1.702 m)   Body mass index is 29.26 kg/m.  Physical Exam Constitutional: Patient appears well-developed and well-nourished. No distress.  HENT: Head: Normocephalic and atraumatic.  Eyes: Conjunctivae and EOM are normal. No scleral icterus. Neck: Normal range of motion. Neck supple. No JVD present. No thyromegaly present.  Cardiovascular: Normal rate, regular rhythm and normal heart sounds.  No murmur heard. No BLE edema. Pulmonary/Chest: Effort normal and breath sounds normal. No respiratory distress. Abdominal: Soft. Bowel sounds are normal, no distension. There is no tenderness. No masses. Musculoskeletal: Normal range of motion, no joint effusions. No gross deformities Neurological: No focal deficits Skin: Skin is  warm and dry. No rash noted. No erythema.  Psychiatric: Patient has a normal mood and affect. behavior is normal. Judgment and thought content normal.  No results found for this or any previous visit (from the past 72 hour(s)).  PHQ2/9: Depression screen [facility] 2/9 11/27/2018 04/17/2018 03/09/2018 08/08/2017 12/01/2016  Decreased Interest 0 0 0 0 0  Down, Depressed, Hopeless 0 0 0 0 0  PHQ - 2 Score 0 0 0 0 0  Altered sleeping 0 - 0 - -  Tired, decreased energy 0 - 0 - -  Change in appetite 0 - 0 - -  Feeling bad or failure about yourself  0 - 0 - -  Trouble concentrating 0 - 0 - -  Moving slowly or fidgety/restless 0 - 0 - -  Suicidal thoughts 0 - 0 - -  PHQ-9 Score 0 - 0 - -  Difficult doing work/chores Not difficult at all - Not difficult at all - -   Fall Risk: Fall Risk  11/27/2018 04/17/2018 03/09/2018 08/08/2017 12/01/2016  Falls in the past year? 0 No No No No  Number falls in past yr: 0 - - - -  Injury with Fall? 0 - - - -  Risk for fall due to : - - Impaired vision - -  Risk for fall due to: Comment - - wears eyeglasses, L cataract; RA - -  Follow up Falls evaluation completed - - - -   Assessment & Plan  1. Acquired hypothyroidism - TSH - We will provide refill of synthroid once lab testing is back.   2. Psoriatic arthritis ([place]) - Keep follow up with Dr. Jefm Bryant  3. History of mastectomy, right - Stable, needs mammogram in July 2020  4. Atypical chest pain - Resolved

## 2018-11-28 ENCOUNTER — Other Ambulatory Visit: Payer: Self-pay | Admitting: Family Medicine

## 2018-11-28 DIAGNOSIS — E039 Hypothyroidism, unspecified: Secondary | ICD-10-CM

## 2018-11-28 MED ORDER — LEVOTHYROXINE SODIUM 75 MCG PO TABS: 75.0000 ug | ORAL_TABLET | Freq: Every day | ORAL | 1 refills | Status: DC

## 2019-01-04 ENCOUNTER — Ambulatory Visit
Admission: RE | Admit: 2019-01-04 | Discharge: 2019-01-04 | Disposition: A | Discharge status: Home / Self Care | Payer: Medicare Other | Attending: Nurse Practitioner | Admitting: Nurse Practitioner

## 2019-01-04 ENCOUNTER — Ambulatory Visit
Admission: RE | Admit: 2019-01-04 | Discharge: 2019-01-04 | Disposition: A | Discharge status: Home / Self Care | Payer: Medicare Other | Source: Ambulatory Visit | Attending: Nurse Practitioner | Admitting: Nurse Practitioner

## 2019-01-04 ENCOUNTER — Ambulatory Visit: Payer: Self-pay | Admitting: *Deleted

## 2019-01-04 ENCOUNTER — Encounter: Payer: Self-pay | Admitting: Nurse Practitioner

## 2019-01-04 ENCOUNTER — Ambulatory Visit: Payer: Medicare Other | Admitting: Nurse Practitioner

## 2019-01-04 VITALS — BP 122/78 | HR 95 | Temp 98.1°F | Resp 14 | Ht 63.0 in | Wt 180.2 lb

## 2019-01-04 DIAGNOSIS — R05 Cough: Secondary | ICD-10-CM

## 2019-01-04 DIAGNOSIS — R059 Cough, unspecified: Secondary | ICD-10-CM

## 2019-01-04 DIAGNOSIS — R5383 Other fatigue: Secondary | ICD-10-CM

## 2019-01-04 DIAGNOSIS — R0602 Shortness of breath: Secondary | ICD-10-CM

## 2019-01-04 DIAGNOSIS — H6123 Impacted cerumen, bilateral: Secondary | ICD-10-CM | POA: Diagnosis not present

## 2019-01-04 MED ORDER — ALBUTEROL SULFATE HFA 108 (90 BASE) MCG/ACT IN AERS: 2.0000 | INHALATION_SPRAY | Freq: Four times a day (QID) | RESPIRATORY_TRACT | 0 refills | Status: DC | PRN

## 2019-01-04 MED ORDER — BENZONATATE 200 MG PO CAPS: 200.0000 mg | ORAL_CAPSULE | Freq: Two times a day (BID) | ORAL | 0 refills | Status: DC | PRN

## 2019-01-04 NOTE — Telephone Encounter (Signed)
Pt called with complaints of sore throat, chest tightness x 1 week, head congestion and shortness of breath with exertion starting 01/03/2019; she states that she something like this 2 weeks ago; she says that she got an inhaler 7-8 months ago at the walk in and that helped; her temp was about 100 on 01/03/2019; recommendations made per nurse triage protocol; she normally sees Dr Ancil Boozer but she has no availibility; pt offered and accepted appointment with Suezanne Cheshire, [place], 01/04/2019 at 1040; she verbalized understanding; will route to office for notification.   Reason for Disposition . [1] MILD difficulty breathing (e.g., minimal/no SOB at rest, SOB with walking, pulse <100) AND [2] NEW-onset or WORSE than normal  Answer Assessment - Initial Assessment Questions 1. RESPIRATORY STATUS: "Describe your breathing?" (e.g., wheezing, shortness of breath, unable to speak, severe coughing)      Shortness of breath with exertion, wheezing, cough worse at night 2. ONSET: "When did this breathing problem begin?"      01/03/2019 3. PATTERN "Does the difficult breathing come and go, or has it been constant since it started?"      Intermittent with exertion 4. SEVERITY: "How bad is your breathing?" (e.g., mild, moderate, severe)    - MILD: No SOB at rest, mild SOB with walking, speaks normally in sentences, can lay down, no retractions, pulse < 100.    - MODERATE: SOB at rest, SOB with minimal exertion and prefers to sit, cannot lie down flat, speaks in phrases, mild retractions, audible wheezing, pulse 100-120.    - SEVERE: Very SOB at rest, speaks in single words, struggling to breathe, sitting hunched forward, retractions, pulse > 120      mild 5. RECURRENT SYMPTOM: "Have you had difficulty breathing before?" If so, ask: "When was the last time?" and "What happened that time?"      Yes about 2 weeks ago 6. CARDIAC HISTORY: "Do you have any history of heart disease?" (e.g., heart attack,  angina, bypass surgery, angioplasty)      no 7. LUNG HISTORY: "Do you have any history of lung disease?"  (e.g., pulmonary embolus, asthma, emphysema)     Related to radiation for breast cancer in 1985 8. CAUSE: "What do you think is causing the breathing problem?"      Chest congestion 9. OTHER SYMPTOMS: "Do you have any other symptoms? (e.g., dizziness, runny nose, cough, chest pain, fever)     Cough, wheezing 10. PREGNANCY: "Is there any chance you are pregnant?" "When was your last menstrual period?"       no 11. TRAVEL: "Have you traveled out of the country in the last month?" (e.g., travel history, exposures)       No, no  Protocols used: BREATHING DIFFICULTY-A-AH

## 2019-01-04 NOTE — Progress Notes (Signed)
Name: Nicole Contreras   MRN: [id number]    DOB: [birth year]   Date:01/04/2019       Progress Note  Subjective  Chief Complaint  Chief Complaint  Patient presents with  . URI    onset 1 week ago, symptoms include chest tighness, congestion and cough    HPI  Patient states had URI in January that self resolved in a week with robitussen over the counter. States this time presented similarly so she took robitussen and mucinex, started to use albuterol inhaler because this time it seemed to go into chest more and having exertional shortness of breath, fatigue. Endorses low-grade fevers the past few days, controlled with tylenol. Her daughter has been sick similarly. This morning felt improved some.   Patient Active Problem List   Diagnosis Date Noted  . History of mastectomy, right 11/27/2018  . Internal hemorrhoids 12/01/2016  . Special screening for malignant neoplasms, colon   . Benign neoplasm of transverse colon   . Muscle spasms of neck 11/30/2015  . Numerous moles 11/30/2015  . Gastro-esophageal reflux disease without esophagitis 09/14/2015  . Acquired hypothyroidism 09/14/2015  . Menopause 09/14/2015  . Overweight 09/14/2015  . Hemorrhage, postmenopausal 09/14/2015  . Vitamin D deficiency 09/14/2015  . History of breast cancer 09/14/2015  . Psoriatic arthritis ([place]) 05/22/2014    Past Medical History:  Diagnosis Date  . Arthritis   . Atypical chest pain    a. 03/2014 ETT: Ex time 4:10, Max HR 160 bpm, no acute st/t changes.  . Breast cancer ([place])    a. 1986 s/p radiation.  . Bright's disease    Hx  . Diastolic dysfunction    a. 02/2014 Echo: EF 55-60%, no rwma, Gr1 DD.  . Dizziness and giddiness   . Fecal smearing   . GERD (gastroesophageal reflux disease)   . Heart murmur    as child  . Insomnia, unspecified   . Obesity, unspecified   . Personal history of chemotherapy   . Personal history of radiation therapy   . PONV (postoperative nausea and vomiting)     after cataract procedure  . Postmenopausal bleeding   . Psoriatic arthropathy ([place])   . Reflux esophagitis   . Right cataract    a. 01/2016 s/p cataract surgery.  Marland Kitchen Spasm of muscle   . Unspecified hypothyroidism   . Unspecified urinary incontinence   . Unspecified vitamin D deficiency     Past Surgical History:  Procedure Laterality Date  . BREAST BIOPSY Left 1990   X 2- neg  . CARDIAC CATHETERIZATION  90's   [facility]; no stent  . CATARACT EXTRACTION W/PHACO Right 02/04/2016   Procedure: CATARACT EXTRACTION PHACO AND INTRAOCULAR LENS PLACEMENT (IOC);  Surgeon: Birder Robson, MD;  Location: [facility];  Service: Ophthalmology;  Laterality: Right;  [place] 00:44 AP% 19.2 CDE 8.49 fluid pack lot # [id number]  . CATARACT EXTRACTION W/PHACO Left 06/12/2018   Procedure: CATARACT EXTRACTION PHACO AND INTRAOCULAR LENS PLACEMENT (IOC);  Surgeon: Birder Robson, MD;  Location: [facility];  Service: Ophthalmology;  Laterality: Left;  [place] 00:56.8 AP% 15.8 CDE 8.95 Fluid Pack lot # [id number]  . colonoscopy    . COLONOSCOPY WITH PROPOFOL N/A 07/15/2016   Procedure: COLONOSCOPY WITH PROPOFOL;  Surgeon: [name], MD;  Location: [facility];  Service: Endoscopy;  Laterality: N/A;  . ENDOMETRIAL BIOPSY    . MASTECTOMY Right 1985  . POLYPECTOMY N/A 07/15/2016   Procedure: POLYPECTOMY;  Surgeon: [name], MD;  Location: [place]  [facility];  Service: Endoscopy;  Laterality: N/A;    Social History   Tobacco Use  . Smoking status: Former Smoker    Packs/day: 0.25    Years: 10.00    Pack years: 2.50    Types: Cigarettes    Last attempt to quit: 11/07/1990    Years since quitting: 28.1  . Smokeless tobacco: Never Used  . Tobacco comment: smoking cessation materials not required  Substance Use Topics  . Alcohol use: No    Alcohol/week: 0.0 standard drinks     Current Outpatient Medications:  .  cholecalciferol (VITAMIN D) 1000 units tablet, Take 1,000 Units by mouth daily. , Disp: , Rfl:  .   Clobetasol Propionate (TEMOVATE) 0.05 % external spray, Apply topically 2 (two) times daily. (Patient taking differently: Apply 1 application topically 2 (two) times daily as needed (for psoriasis). ), Disp: 59 mL, Rfl: 0 .  Cyanocobalamin (VITAMIN B-12) 5000 MCG TBDP, Take 5,000 mcg by mouth daily., Disp: , Rfl:  .  diclofenac (VOLTAREN) 50 MG EC tablet, Take 50 mg by mouth 2 (two) times daily as needed for moderate pain. , Disp: , Rfl:  .  levothyroxine (SYNTHROID, LEVOTHROID) 75 MCG tablet, Take 1 tablet (75 mcg total) by mouth daily., Disp: 90 tablet, Rfl: 1 .  MAGNESIUM PO, Take 2 each by mouth daily as needed (for stress)., Disp: , Rfl:  .  methotrexate 250 MG/10ML injection, Inject 22.5 mg into the muscle See admin instructions. Inject 22.5 mg IM weekly if needed for inflammation., Disp: , Rfl:   Allergies  Allergen Reactions  . Sulfa Antibiotics Rash    ROS    No other specific complaints in a complete review of systems (except as listed in HPI above).  Objective  Vitals:   01/04/19 1038  BP: 122/78  Pulse: 95  Resp: 14  Temp: 98.1 F (36.7 C)  TempSrc: Oral  SpO2: 97%  Weight: 180 lb 3.2 oz (81.7 kg)  Height: 5\' 3"  (1.6 m)    Body mass index is 31.92 kg/m.  Nursing Note and Vital Signs reviewed.  Physical Exam Vitals signs reviewed.  Constitutional:      Appearance: She is well-developed.  HENT:     Head: Normocephalic and atraumatic.     Right Ear: Hearing, tympanic membrane, ear canal and external ear normal. There is impacted cerumen.     Left Ear: Hearing, tympanic membrane, ear canal and external ear normal. There is impacted cerumen.     Nose: Nose normal.     Right Sinus: No maxillary sinus tenderness or frontal sinus tenderness.     Left Sinus: No maxillary sinus tenderness or frontal sinus tenderness.     Mouth/Throat:     Mouth: Mucous membranes are moist.     Pharynx: Uvula midline. No oropharyngeal exudate or posterior oropharyngeal erythema.   Eyes:     General:        Right eye: No discharge.        Left eye: No discharge.     Conjunctiva/sclera: Conjunctivae normal.  Neck:     Musculoskeletal: Normal range of motion and neck supple.     Vascular: No carotid bruit.  Cardiovascular:     Rate and Rhythm: Normal rate.     Heart sounds: Normal heart sounds.  Pulmonary:     Effort: Pulmonary effort is normal.     Breath sounds: Normal breath sounds.  Musculoskeletal: Normal range of motion.  Lymphadenopathy:     Cervical: No cervical  adenopathy.  Skin:    General: Skin is warm and dry.     Capillary Refill: Capillary refill takes less than 2 seconds.     Findings: No rash.  Neurological:     Mental Status: She is alert and oriented to person, place, and time.     GCS: GCS eye subscore is 4. GCS verbal subscore is 5. GCS motor subscore is 6.  Psychiatric:        Speech: Speech normal.       No results found for this or any previous visit (from the past 48 hour(s)).  Assessment & Plan  1. Cough - DG Chest 2 View; Future - benzonatate (TESSALON) 200 MG capsule; Take 1 capsule (200 mg total) by mouth 2 (two) times daily as needed for cough.  Dispense: 30 capsule; Refill: 0  2. Exertional shortness of breath Consider pneumonia due to exertional shortness of breath, fevers/chills, cough persisting over 7 days. Ordered chest x-ray, discussed OTC management, return if not improved, ER if worsening. Order antibiotics based on xray results - DG Chest 2 View; Future - albuterol (PROVENTIL HFA;VENTOLIN HFA) 108 (90 Base) MCG/ACT inhaler; Inhale 2 puffs into the lungs every 6 (six) hours as needed for wheezing or shortness of breath.  Dispense: 1 Inhaler; Refill: 0  3. Other fatigue Likely due to infection, rest, hydration, proper nutrition, return if not improved  4. Bilateral impacted cerumen  For earwax build up use ONE of these over the counter options to soften up with ear wax in your ear: mineral oil drops, docusate  (liquid), debrox. Place a 2 drops in your ear while laying on your left side- Keep laying for 30-60 minutes on that side after application. Can use once daily to soften ear wax. Wash in shower afterwards,  . do not stick anything in your ear as it further impact the ear wax

## 2019-01-04 NOTE — Patient Instructions (Addendum)
-   Please go across the street to the imaging center to get your chest xray.  - Continue drinking lots of water, rest, eat healthy foods with plenty of vitamin C and A - continue musinex, and humidifier - continue using acetaminophen for fever control - Take cough suppressant as needed and using albuterol for shortness of breath.     For earwax build up use ONE of these over the counter options to soften up with ear wax in your ear: mineral oil drops, docusate (liquid), debrox. Place a 2 drops in your ear while laying on your left side- Keep laying for 30-60 minutes on that side after application. Can use once daily to soften ear wax. Wash in shower afterwards,  . do not stick anything in your ear as it further impact the ear wax

## 2019-03-14 ENCOUNTER — Ambulatory Visit (INDEPENDENT_AMBULATORY_CARE_PROVIDER_SITE_OTHER): Payer: Medicare Other

## 2019-03-14 VITALS — BP 118/73 | Ht 63.0 in | Wt 169.9 lb

## 2019-03-14 DIAGNOSIS — Z0000 Encounter for general adult medical examination without abnormal findings: Secondary | ICD-10-CM | POA: Diagnosis not present

## 2019-03-14 DIAGNOSIS — M8580 Other specified disorders of bone density and structure, unspecified site: Secondary | ICD-10-CM

## 2019-03-14 DIAGNOSIS — M810 Age-related osteoporosis without current pathological fracture: Secondary | ICD-10-CM | POA: Diagnosis not present

## 2019-03-14 DIAGNOSIS — Z780 Asymptomatic menopausal state: Secondary | ICD-10-CM

## 2019-03-14 DIAGNOSIS — Z1231 Encounter for screening mammogram for malignant neoplasm of breast: Secondary | ICD-10-CM

## 2019-03-14 NOTE — Patient Instructions (Signed)
Nicole Contreras , Thank you for taking time to come for your Medicare Wellness Visit. I appreciate your ongoing commitment to your health goals. Please review the following plan we discussed and let me know if I can assist you in the future.   Screening recommendations/referrals: Colonoscopy: done 07/15/16. Repeat in 2027 Mammogram: done 05/08/18. Please call [phone] to schedule your mammogram and bone density screening. Bone Density: done 08/21/13 Recommended yearly ophthalmology/optometry visit for glaucoma screening and checkup Recommended yearly dental visit for hygiene and checkup  Vaccinations: Influenza vaccine: done 08/23/18 Pneumococcal vaccine: done 09/14/15 Tdap vaccine: done 10/05/2010 Shingles vaccine: Shingrix discussed. Please contact your pharmacy for coverage information.   Advanced directives: Advance directive discussed with you today. I have mailed a copy for you to complete at home and have notarized. Once this is complete please bring a copy in to our office so we can scan it into your chart.  Conditions/risks identified: Recommend eating 3-4 servings of fruits and vegetables per day.   Next appointment: Please follow up in one year for your Medicare Annual Wellness visit.     Preventive Care 71 Years and Older, Female Preventive care refers to lifestyle choices and visits with your health care provider that can promote health and wellness. What does preventive care include?  A yearly physical exam. This is also called an annual well check.  Dental exams once or twice a year.  Routine eye exams. Ask your health care provider how often you should have your eyes checked.  Personal lifestyle choices, including:  Daily care of your teeth and gums.  Regular physical activity.  Eating a healthy diet.  Avoiding tobacco and drug use.  Limiting alcohol use.  Practicing safe sex.  Taking low-dose aspirin every day.  Taking vitamin and mineral supplements as  recommended by your health care provider. What happens during an annual well check? The services and screenings done by your health care provider during your annual well check will depend on your age, overall health, lifestyle risk factors, and family history of disease. Counseling  Your health care provider may ask you questions about your:  Alcohol use.  Tobacco use.  Drug use.  Emotional well-being.  Home and relationship well-being.  Sexual activity.  Eating habits.  History of falls.  Memory and ability to understand (cognition).  Work and work Statistician.  Reproductive health. Screening  You may have the following tests or measurements:  Height, weight, and BMI.  Blood pressure.  Lipid and cholesterol levels. These may be checked every 5 years, or more frequently if you are over 70 years old.  Skin check.  Lung cancer screening. You may have this screening every year starting at age 7 if you have a 30-pack-year history of smoking and currently smoke or have quit within the past 15 years.  Fecal occult blood test (FOBT) of the stool. You may have this test every year starting at age 73.  Flexible sigmoidoscopy or colonoscopy. You may have a sigmoidoscopy every 5 years or a colonoscopy every 10 years starting at age 71.  Hepatitis C blood test.  Hepatitis B blood test.  Sexually transmitted disease (STD) testing.  Diabetes screening. This is done by checking your blood sugar (glucose) after you have not eaten for a while (fasting). You may have this done every 1-3 years.  Bone density scan. This is done to screen for osteoporosis. You may have this done starting at age 56.  Mammogram. This may be done every 1-2 years.  Talk to your health care provider about how often you should have regular mammograms. Talk with your health care provider about your test results, treatment options, and if necessary, the need for more tests. Vaccines  Your health care  provider may recommend certain vaccines, such as:  Influenza vaccine. This is recommended every year.  Tetanus, diphtheria, and acellular pertussis (Tdap, Td) vaccine. You may need a Td booster every 10 years.  Zoster vaccine. You may need this after age 23.  Pneumococcal 13-valent conjugate (PCV13) vaccine. One dose is recommended after age 90.  Pneumococcal polysaccharide (PPSV23) vaccine. One dose is recommended after age 50. Talk to your health care provider about which screenings and vaccines you need and how often you need them. This information is not intended to replace advice given to you by your health care provider. Make sure you discuss any questions you have with your health care provider. Document Released: 11/20/2015 Document Revised: 07/13/2016 Document Reviewed: 08/25/2015 Elsevier Interactive Patient Education  2017 [place] Prevention in the Home Falls can cause injuries. They can happen to people of all ages. There are many things you can do to make your home safe and to help prevent falls. What can I do on the outside of my home?  Regularly fix the edges of walkways and driveways and fix any cracks.  Remove anything that might make you trip as you walk through a door, such as a raised step or threshold.  Trim any bushes or trees on the path to your home.  Use bright outdoor lighting.  Clear any walking paths of anything that might make someone trip, such as rocks or tools.  Regularly check to see if handrails are loose or broken. Make sure that both sides of any steps have handrails.  Any raised decks and porches should have guardrails on the edges.  Have any leaves, snow, or ice cleared regularly.  Use sand or salt on walking paths during winter.  Clean up any spills in your garage right away. This includes oil or grease spills. What can I do in the bathroom?  Use night lights.  Install grab bars by the toilet and in the tub and shower. Do  not use towel bars as grab bars.  Use non-skid mats or decals in the tub or shower.  If you need to sit down in the shower, use a plastic, non-slip stool.  Keep the floor dry. Clean up any water that spills on the floor as soon as it happens.  Remove soap buildup in the tub or shower regularly.  Attach bath mats securely with double-sided non-slip rug tape.  Do not have throw rugs and other things on the floor that can make you trip. What can I do in the bedroom?  Use night lights.  Make sure that you have a light by your bed that is easy to reach.  Do not use any sheets or blankets that are too big for your bed. They should not hang down onto the floor.  Have a firm chair that has side arms. You can use this for support while you get dressed.  Do not have throw rugs and other things on the floor that can make you trip. What can I do in the kitchen?  Clean up any spills right away.  Avoid walking on wet floors.  Keep items that you use a lot in easy-to-reach places.  If you need to reach something above you, use a strong step stool  that has a grab bar.  Keep electrical cords out of the way.  Do not use floor polish or wax that makes floors slippery. If you must use wax, use non-skid floor wax.  Do not have throw rugs and other things on the floor that can make you trip. What can I do with my stairs?  Do not leave any items on the stairs.  Make sure that there are handrails on both sides of the stairs and use them. Fix handrails that are broken or loose. Make sure that handrails are as long as the stairways.  Check any carpeting to make sure that it is firmly attached to the stairs. Fix any carpet that is loose or worn.  Avoid having throw rugs at the top or bottom of the stairs. If you do have throw rugs, attach them to the floor with carpet tape.  Make sure that you have a light switch at the top of the stairs and the bottom of the stairs. If you do not have them,  ask someone to add them for you. What else can I do to help prevent falls?  Wear shoes that:  Do not have high heels.  Have rubber bottoms.  Are comfortable and fit you well.  Are closed at the toe. Do not wear sandals.  If you use a stepladder:  Make sure that it is fully opened. Do not climb a closed stepladder.  Make sure that both sides of the stepladder are locked into place.  Ask someone to hold it for you, if possible.  Clearly mark and make sure that you can see:  Any grab bars or handrails.  First and last steps.  Where the edge of each step is.  Use tools that help you move around (mobility aids) if they are needed. These include:  Canes.  Walkers.  Scooters.  Crutches.  Turn on the lights when you go into a dark area. Replace any light bulbs as soon as they burn out.  Set up your furniture so you have a clear path. Avoid moving your furniture around.  If any of your floors are uneven, fix them.  If there are any pets around you, be aware of where they are.  Review your medicines with your doctor. Some medicines can make you feel dizzy. This can increase your chance of falling. Ask your doctor what other things that you can do to help prevent falls. This information is not intended to replace advice given to you by your health care provider. Make sure you discuss any questions you have with your health care provider. Document Released: 08/20/2009 Document Revised: 03/31/2016 Document Reviewed: 11/28/2014 Elsevier Interactive Patient Education  2017 [employer].

## 2019-03-14 NOTE — Progress Notes (Signed)
Subjective:   Nicole Contreras is a 73 y.o. female who presents for Medicare Annual (Subsequent) preventive examination.  Virtual Visit via Telephone Note  I connected with Nicole Contreras on 03/14/19 at  9:40 AM EDT by telephone and verified that I am speaking with the correct person using two identifiers.  Medicare Annual Wellness visit completed telephonically due to Covid-19 pandemic.   Location: Patient: home  Provider: office   I discussed the limitations, risks, security and privacy concerns of performing an evaluation and management service by telephone and the availability of in person appointments.The patient expressed understanding and agreed to proceed.  Some vital signs may be absent or patient reported.   Clemetine Marker, LPN  Review of Systems:   Cardiac Risk Factors include: advanced age (>33men, >44 women);obesity (BMI >30kg/m2)     Objective:     Vitals: BP 118/73   Ht 5\' 3"  (1.6 m)   Wt 169 lb 14.4 oz (77.1 kg)   BMI 30.10 kg/m   Body mass index is 30.1 kg/m.  Advanced Directives 03/14/2019 06/12/2018 03/09/2018 11/12/2017 08/08/2017 12/01/2016 07/15/2016  Does Patient Have a Medical Advance Directive? No No No No No No No  Would patient like information on creating a medical advance directive? Yes (MAU/Ambulatory/Procedural Areas - Information given) No - Patient declined Yes (MAU/Ambulatory/Procedural Areas - Information given) - - - Yes - Educational materials given    Tobacco Social History   Tobacco Use  Smoking Status Former Smoker  . Packs/day: 0.25  . Years: 10.00  . Pack years: 2.50  . Types: Cigarettes  . Last attempt to quit: 11/07/1990  . Years since quitting: 28.3  Smokeless Tobacco Never Used  Tobacco Comment   smoking cessation materials not required     Counseling given: Not Answered Comment: smoking cessation materials not required   Clinical Intake:  Pre-visit preparation completed: Yes  Pain : No/denies pain     BMI -  recorded: 30.1 Nutritional Status: BMI > 30  Obese Nutritional Risks: None Diabetes: No  How often do you need to have someone help you when you read instructions, pamphlets, or other written materials from your doctor or pharmacy?: 1 - Never  Interpreter Needed?: No  Information entered by :: Clemetine Marker LPN  Past Medical History:  Diagnosis Date  . Arthritis   . Atypical chest pain    a. 03/2014 ETT: Ex time 4:10, Max HR 160 bpm, no acute st/t changes.  . Breast cancer ([place])    a. 1986 s/p radiation.  . Bright's disease    Hx  . Diastolic dysfunction    a. 02/2014 Echo: EF 55-60%, no rwma, Gr1 DD.  . Dizziness and giddiness   . Fecal smearing   . GERD (gastroesophageal reflux disease)   . Heart murmur    as child  . Insomnia, unspecified   . Obesity, unspecified   . Personal history of chemotherapy   . Personal history of radiation therapy   . PONV (postoperative nausea and vomiting)    after cataract procedure  . Postmenopausal bleeding   . Psoriasis   . 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 Reflux esophagitis   . Right cataract    a. 01/2016 s/p cataract surgery.  Marland Kitchen Spasm of muscle   . Unspecified hypothyroidism   . Unspecified urinary incontinence   . Unspecified vitamin D deficiency    Past Surgical History:  Procedure Laterality Date  . BREAST BIOPSY Left 1990   X 2- neg  .  CARDIAC CATHETERIZATION  90's   [facility]; no stent  . CATARACT EXTRACTION W/PHACO Right 02/04/2016   Procedure: CATARACT EXTRACTION PHACO AND INTRAOCULAR LENS PLACEMENT (IOC);  Surgeon: Birder Robson, MD;  Location: [facility];  Service: Ophthalmology;  Laterality: Right;  [place] 00:44 AP% 19.2 CDE 8.49 fluid pack lot # [id number]  . CATARACT EXTRACTION W/PHACO Left 06/12/2018   Procedure: CATARACT EXTRACTION PHACO AND INTRAOCULAR LENS PLACEMENT (IOC);  Surgeon: Birder Robson, MD;  Location: [facility];  Service: Ophthalmology;  Laterality: Left;  [place] 00:56.8 AP% 15.8 CDE 8.95 Fluid Pack lot #  [id number]  . colonoscopy    . COLONOSCOPY WITH PROPOFOL N/A 07/15/2016   Procedure: COLONOSCOPY WITH PROPOFOL;  Surgeon: [name], MD;  Location: [facility];  Service: Endoscopy;  Laterality: N/A;  . ENDOMETRIAL BIOPSY    . MASTECTOMY Right 1985  . POLYPECTOMY N/A 07/15/2016   Procedure: POLYPECTOMY;  Surgeon: [name], MD;  Location: [place];  Service: Endoscopy;  Laterality: N/A;   Family History  Problem Relation Age of Onset  . Uterine cancer Mother   . Lung cancer Mother   . Stroke Mother   . Hypertension Mother   . Dementia Mother   . Heart disease Father   . Arrhythmia Father        afib  . Cervical cancer Sister   . Arrhythmia Sister        afib  . Breast cancer Sister   . Breast cancer Paternal Aunt 33   Social History   Socioeconomic History  . Marital status: Married    Spouse name: Quillian Quince  . Number of children: 2  . Years of education: Not on file  . Highest education level: Associate degree: occupational, Hotel manager, or vocational program  Occupational History  . Occupation: Retired  Scientific laboratory technician  . Financial resource strain: Not hard at all  . Food insecurity:    Worry: Never true    Inability: Never true  . Transportation needs:    Medical: No    Non-medical: No  Tobacco Use  . Smoking status: Former Smoker    Packs/day: 0.25    Years: 10.00    Pack years: 2.50    Types: Cigarettes    Last attempt to quit: 11/07/1990    Years since quitting: 28.3  . Smokeless tobacco: Never Used  . Tobacco comment: smoking cessation materials not required  Substance and Sexual Activity  . Alcohol use: No    Alcohol/week: 0.0 standard drinks  . Drug use: No  . Sexual activity: Not Currently  Lifestyle  . Physical activity:    Days per week: 3 days    Minutes per session: 30 min  . Stress: Not at all  Relationships  . Social connections:    Talks on phone: More than three times a week    Gets together: More than three times a week     Attends religious service: Never    Active member of club or organization: No    Attends meetings of clubs or organizations: Never    Relationship status: Married  Other Topics Concern  . Not on file  Social History Narrative  . Not on file    Outpatient Encounter Medications as of 03/14/2019  Medication Sig  . albuterol (PROVENTIL HFA;VENTOLIN HFA) 108 (90 Base) MCG/ACT inhaler Inhale 2 puffs into the lungs every 6 (six) hours as needed for wheezing or shortness of breath.  . cholecalciferol (VITAMIN D) 1000 units tablet Take 1,000 Units by  mouth daily.   . Clobetasol Propionate (TEMOVATE) 0.05 % external spray Apply topically 2 (two) times daily. (Patient taking differently: Apply 1 application topically 2 (two) times daily as needed (for psoriasis). )  . diclofenac (VOLTAREN) 50 MG EC tablet Take 50 mg by mouth 2 (two) times daily as needed for moderate pain.   Marland Kitchen levothyroxine (SYNTHROID, LEVOTHROID) 75 MCG tablet Take 1 tablet (75 mcg total) by mouth daily.  Marland Kitchen MAGNESIUM PO Take 2 each by mouth daily as needed (for stress).  . Multiple Vitamin (MULTIVITAMIN) tablet Take 1 tablet by mouth daily.  . [DISCONTINUED] Cyanocobalamin (VITAMIN B-12) 5000 MCG TBDP Take 5,000 mcg by mouth daily.  . methotrexate 250 MG/10ML injection Inject 22.5 mg into the muscle See admin instructions. Inject 22.5 mg IM weekly if needed for inflammation.  . [DISCONTINUED] benzonatate (TESSALON) 200 MG capsule Take 1 capsule (200 mg total) by mouth 2 (two) times daily as needed for cough.   No facility-administered encounter medications on file as of 03/14/2019.     Activities of Daily Living In your present state of health, do you have any difficulty performing the following activities: 03/14/2019 01/04/2019  Hearing? N N  Comment declines hearing aids -  Vision? N N  Comment reading glasses -  Difficulty concentrating or making decisions? N N  Walking or climbing stairs? N N  Dressing or bathing? N N  Doing  errands, shopping? N N  Preparing Food and eating ? N -  Using the Toilet? N -  In the past six months, have you accidently leaked urine? Y -  Comment wears pads for protection -  Do you have problems with loss of bowel control? N -  Managing your Medications? N -  Managing your Finances? N -  Housekeeping or managing your Housekeeping? N -  Some recent data might be hidden    Patient Care Team: Steele Sizer, MD as PCP - General (Family Medicine) Emmaline Kluver., MD as Consulting Physician (Rheumatology) Oneta Rack, MD as Consulting Physician (Dermatology)    Assessment:   This is a routine wellness examination for Nicole Contreras.  Exercise Activities and Dietary recommendations Current Exercise Habits: Home exercise routine, Type of exercise: walking, Time (Minutes): 30, Frequency (Times/Week): 3, Weekly Exercise (Minutes/Week): 90, Intensity: Mild, Exercise limited by: None identified  Goals    . DIET - INCREASE WATER INTAKE     Recommend to drink at least 6-8 8oz glasses of water per day.       Fall Risk Fall Risk  03/14/2019 01/04/2019 11/27/2018 04/17/2018 03/09/2018  Falls in the past year? 0 0 0 No No  Number falls in past yr: 0 - 0 - -  Injury with Fall? 0 - 0 - -  Risk for fall due to : - - - - Impaired vision  Risk for fall due to: Comment - - - - wears eyeglasses, L cataract; RA  Follow up Falls prevention discussed - Falls evaluation completed - -   FALL RISK PREVENTION PERTAINING TO THE HOME:  Any stairs in or around the home? Yes  If so, do they handrails? Yes   Home free of loose throw rugs in walkways, pet beds, electrical cords, etc? Yes  Adequate lighting in your home to reduce risk of falls? Yes   ASSISTIVE DEVICES UTILIZED TO PREVENT FALLS:  Life alert? No  Use of a cane, walker or w/c? No  Grab bars in the bathroom? Yes  Shower chair or bench in  shower? Yes  Elevated toilet seat or a handicapped toilet? No  DME ORDERS:  DME order  needed?  No   TIMED UP AND GO:  Was the test performed? No . Telephonic visit.  Education: Fall risk prevention has been discussed.  Intervention(s) required? No   Depression Screen PHQ 2/9 Scores 03/14/2019 01/04/2019 11/27/2018 04/17/2018  PHQ - 2 Score 0 0 0 0  PHQ- 9 Score - 0 0 -     Cognitive Function     6CIT Screen 03/14/2019 03/09/2018  What Year? 0 points 0 points  What month? 0 points 0 points  What time? 0 points 3 points  Count back from 20 0 points 0 points  Months in reverse 0 points 0 points  Repeat phrase 0 points 0 points  Total Score 0 3    Immunization History  Administered Date(s) Administered  . Influenza, High Dose Seasonal PF 08/08/2017  . Influenza, Seasonal, Injecte, Preservative Fre 08/16/2012, 08/07/2013  . Influenza,inj,Quad PF,6+ Mos 08/04/2014  . Influenza,inj,quad, With Preservative 08/31/2018  . Influenza-Unspecified 08/24/2015, 08/18/2016, 08/23/2018  . Pneumococcal Conjugate-13 09/14/2015  . Pneumococcal Polysaccharide-23 08/19/2013  . Tdap 10/05/2010  . Zoster 04/08/2011    Qualifies for Shingles Vaccine? Yes  Zostavax completed 2012. Due for Shingrix. Education has been provided regarding the importance of this vaccine. Pt has been advised to call insurance company to determine out of pocket expense. Advised may also receive vaccine at local pharmacy or Health Dept. Verbalized acceptance and understanding.  Tdap: Up to date  Flu Vaccine: Up to date  Pneumococcal Vaccine: Up to date   Screening Tests Health Maintenance  Topic Date Due  . PNA vac Low Risk Adult (2 of 2 - PPSV23) 08/19/2018  . MAMMOGRAM  05/09/2019  . INFLUENZA VACCINE  06/08/2019  . TETANUS/TDAP  10/05/2020  . COLONOSCOPY  07/15/2026  . DEXA SCAN  Completed  . Hepatitis C Screening  Completed    Cancer Screenings:  Colorectal Screening: Completed 07/15/16. Repeat every 10 years.  Mammogram: Completed 05/08/18. Repeat every year; Ordered today. Pt provided with  contact information and advised to call to schedule appt.   Bone Density: Completed 08/21/13. Results reflect OSTEOPENIA. Repeat every 2 years. Ordered today. Pt provided with contact information and advised to call to schedule appt.   Lung Cancer Screening: (Low Dose CT Chest recommended if Age 38-80 years, 30 pack-year currently smoking OR have quit w/in 15years.) does not qualify.   Additional Screening:  Hepatitis C Screening: does qualify; Completed 03/20/13  Vision Screening: Recommended annual ophthalmology exams for early detection of glaucoma and other disorders of the eye. Is the patient up to date with their annual eye exam?  Yes  Who is the provider or what is the name of the office in which the pt attends annual eye exams? Dr. Michelene Heady  Dental Screening: Recommended annual dental exams for proper oral hygiene  Community Resource Referral:  CRR required this visit?  No      Plan:     I have personally reviewed and addressed the Medicare Annual Wellness questionnaire and have noted the following in the patient's chart:  A. Medical and social history B. Use of alcohol, tobacco or illicit drugs  C. Current medications and supplements D. Functional ability and status E.  Nutritional status F.  Physical activity G. Advance directives H. List of other physicians I.  Hospitalizations, surgeries, and ER visits in previous 12 months J.  [place] such as hearing and vision if  needed, cognitive and depression L. Referrals and appointments   In addition, I have reviewed and discussed with patient certain preventive protocols, quality metrics, and best practice recommendations. A written personalized care plan for preventive services as well as general preventive health recommendations were provided to patient.   Signed,  Clemetine Marker, LPN Nurse Health Advisor   Nurse Notes: Pt doing well and appreciative of telephonic visit today. Pt advised needs to schedule  CPE with Dr. Ancil Boozer.

## 2019-07-11 ENCOUNTER — Other Ambulatory Visit: Payer: Self-pay

## 2019-07-11 ENCOUNTER — Ambulatory Visit: Payer: Medicare Other | Admitting: Family Medicine

## 2019-07-11 ENCOUNTER — Encounter: Payer: Self-pay | Admitting: Family Medicine

## 2019-07-11 VITALS — BP 116/64 | HR 79 | Temp 96.9°F | Resp 14 | Ht 63.0 in | Wt 166.6 lb

## 2019-07-11 DIAGNOSIS — E039 Hypothyroidism, unspecified: Secondary | ICD-10-CM | POA: Diagnosis not present

## 2019-07-11 DIAGNOSIS — R300 Dysuria: Secondary | ICD-10-CM | POA: Diagnosis not present

## 2019-07-11 MED ORDER — LEVOTHYROXINE SODIUM 75 MCG PO TABS: 75.0000 ug | ORAL_TABLET | Freq: Every day | ORAL | 1 refills | Status: DC

## 2019-07-11 MED ORDER — CEPHALEXIN 500 MG PO CAPS: 500.0000 mg | ORAL_CAPSULE | Freq: Four times a day (QID) | ORAL | 0 refills | Status: AC

## 2019-07-11 NOTE — Progress Notes (Signed)
Patient ID: Nicole Contreras, female    DOB: 1946/10/19, 73 y.o.   MRN: [id number]  PCP: Steele Sizer, MD  Chief Complaint  Patient presents with  . Urinary Tract Infection    Subjective:   Nicole Contreras is a 73 y.o. female, presents to clinic with CC of the following:  Pt yesterday noticed change in her urine odor, then had some low pelvic/bladder discomfort followed by dysuria, similar to past UTI's.  Last UTI was over a year ago, she's had them more frequently in the past several years.  She had a kidney infection only as a child she thinks.  She is not currently on any immunosuppressive tx or medicines - since COVID she has not gone to rheumatology for her injections.    Urinary Tract Infection  This is a recurrent problem. The current episode started yesterday. The problem occurs every urination. The problem has been gradually worsening. The quality of the pain is described as aching and burning. The pain is mild. There has been no fever. Associated symptoms include frequency. Pertinent negatives include no chills, discharge, flank pain, hematuria, hesitancy, nausea, possible pregnancy, sweats, urgency or vomiting. Associated symptoms comments: + abdominal pain, denies vaginal discharge, genital itching burning rash swelling or lesions. Treatments tried: AZO. The treatment provided no relief. Her past medical history is significant for recurrent UTIs. There is no history of catheterization, kidney stones, a single kidney, urinary stasis or a urological procedure.      Patient Active Problem List   Diagnosis Date Noted  . History of mastectomy, right 11/27/2018  . Internal hemorrhoids 12/01/2016  . Special screening for malignant neoplasms, colon   . Benign neoplasm of transverse colon   . Muscle spasms of neck 11/30/2015  . Numerous moles 11/30/2015  . Gastro-esophageal reflux disease without esophagitis 09/14/2015  . Acquired hypothyroidism 09/14/2015  . Menopause  09/14/2015  . Overweight 09/14/2015  . Hemorrhage, postmenopausal 09/14/2015  . Vitamin D deficiency 09/14/2015  . History of breast cancer 09/14/2015  . Psoriatic arthritis ([place]) 05/22/2014      Current Outpatient Medications:  .  cholecalciferol (VITAMIN D) 1000 units tablet, Take 1,000 Units by mouth daily. , Disp: , Rfl:  .  Clobetasol Propionate (TEMOVATE) 0.05 % external spray, Apply topically 2 (two) times daily. (Patient taking differently: Apply 1 application topically 2 (two) times daily as needed (for psoriasis). ), Disp: 59 mL, Rfl: 0 .  levothyroxine (SYNTHROID, LEVOTHROID) 75 MCG tablet, Take 1 tablet (75 mcg total) by mouth daily., Disp: 90 tablet, Rfl: 1 .  MAGNESIUM PO, Take 2 each by mouth daily as needed (for stress)., Disp: , Rfl:  .  Multiple Vitamin (MULTIVITAMIN) tablet, Take 1 tablet by mouth daily., Disp: , Rfl:  .  albuterol (PROVENTIL HFA;VENTOLIN HFA) 108 (90 Base) MCG/ACT inhaler, Inhale 2 puffs into the lungs every 6 (six) hours as needed for wheezing or shortness of breath. (Patient not taking: Reported on 07/11/2019), Disp: 1 Inhaler, Rfl: 0 .  methotrexate 250 MG/10ML injection, Inject 22.5 mg into the muscle See admin instructions. Inject 22.5 mg IM weekly if needed for inflammation., Disp: , Rfl:    Allergies  Allergen Reactions  . Sulfa Antibiotics Rash     Family History  Problem Relation Age of Onset  . Uterine cancer Mother   . Lung cancer Mother   . Stroke Mother   . Hypertension Mother   . Dementia Mother   . Heart disease Father   . Arrhythmia  Father        afib  . Cervical cancer Sister   . Arrhythmia Sister        afib  . Breast cancer Sister   . Breast cancer Paternal Aunt 31     Social History   Socioeconomic History  . Marital status: Married    Spouse name: Quillian Quince  . Number of children: 2  . Years of education: Not on file  . Highest education level: Associate degree: occupational, Hotel manager, or vocational program   Occupational History  . Occupation: Retired  Scientific laboratory technician  . Financial resource strain: Not hard at all  . Food insecurity    Worry: Never true    Inability: Never true  . Transportation needs    Medical: No    Non-medical: No  Tobacco Use  . Smoking status: Former Smoker    Packs/day: 0.25    Years: 10.00    Pack years: 2.50    Types: Cigarettes    Quit date: 11/07/1990    Years since quitting: 28.6  . Smokeless tobacco: Never Used  . Tobacco comment: smoking cessation materials not required  Substance and Sexual Activity  . Alcohol use: No    Alcohol/week: 0.0 standard drinks  . Drug use: No  . Sexual activity: Not Currently  Lifestyle  . Physical activity    Days per week: 3 days    Minutes per session: 30 min  . Stress: Not at all  Relationships  . Social connections    Talks on phone: More than three times a week    Gets together: More than three times a week    Attends religious service: Never    Active member of club or organization: No    Attends meetings of clubs or organizations: Never    Relationship status: Married  . Intimate partner violence    Fear of current or ex partner: No    Emotionally abused: No    Physically abused: No    Forced sexual activity: No  Other Topics Concern  . Not on file  Social History Narrative  . Not on file    I personally reviewed active problem list, medication list, allergies, family history, social history, health maintenance, notes from last encounter, lab results with the patient/caregiver today.  Review of Systems  Constitutional: Negative.  Negative for activity change, appetite change, chills, diaphoresis, fatigue, fever and unexpected weight change.  HENT: Negative.   Eyes: Negative.   Respiratory: Negative.   Cardiovascular: Negative.   Gastrointestinal: Negative.  Negative for nausea and vomiting.  Endocrine: Negative.   Genitourinary: Positive for frequency and pelvic pain. Negative for decreased urine  volume, difficulty urinating, dyspareunia, dysuria, enuresis, flank pain, genital sores, hematuria, hesitancy, menstrual problem, urgency, vaginal bleeding, vaginal discharge and vaginal pain.  Musculoskeletal: Negative.   Skin: Negative.   Allergic/Immunologic: Negative.   Neurological: Negative.   Hematological: Negative.   Psychiatric/Behavioral: Negative.   All other systems reviewed and are negative.      Objective:   Vitals:   07/11/19 1055  BP: 116/64  Pulse: 79  Resp: 14  Temp: (!) 96.9 F (36.1 C)  SpO2: 97%  Weight: 166 lb 9.6 oz (75.6 kg)  Height: 5\' 3"  (1.6 m)    Body mass index is 29.51 kg/m.  Physical Exam Vitals signs and nursing note reviewed.  Constitutional:      General: She is not in acute distress.    Appearance: Normal appearance. She is well-developed. She is  not ill-appearing, toxic-appearing or diaphoretic.  HENT:     Head: Normocephalic and atraumatic.     Right Ear: External ear normal.     Left Ear: External ear normal.     Mouth/Throat:     Mouth: Mucous membranes are moist.     Pharynx: Oropharynx is clear.  Eyes:     General:        Right eye: No discharge.        Left eye: No discharge.     Conjunctiva/sclera: Conjunctivae normal.  Neck:     Trachea: No tracheal deviation.  Cardiovascular:     Rate and Rhythm: Normal rate and regular rhythm.  Pulmonary:     Effort: Pulmonary effort is normal. No respiratory distress.     Breath sounds: No stridor.  Abdominal:     General: Bowel sounds are normal. There is no distension.     Palpations: Abdomen is soft.     Tenderness: There is no right CVA tenderness, left CVA tenderness or guarding.     Hernia: No hernia is present.     Comments: Mild suprapubic ttp w/o guarding or rebound  Musculoskeletal: Normal range of motion.  Skin:    General: Skin is warm and dry.     Findings: No rash.  Neurological:     Mental Status: She is alert.     Motor: No abnormal muscle tone.      Coordination: Coordination normal.  Psychiatric:        Behavior: Behavior normal.      Results for orders placed or performed in visit on 11/27/18  TSH  Result Value Ref Range   TSH 1.70 0.40 - 4.50 mIU/L        Assessment & Plan:      ICD-10-CM   1. Dysuria  R30.0 Urine Culture    Urinalysis, Routine w reflex microscopic    cephALEXin (KEFLEX) 500 MG capsule  2. Acquired hypothyroidism  E03.9 levothyroxine (SYNTHROID) 75 MCG tablet     Pt with dysuria, consistent with past UTI's, she did take AZO to treat and was having difficulty leaving a sample, so POC dip was not done and pt was eventually able to leave a sample with the lab to be sent out for UA/micro/urine culture. Will treat empirically, Pt well appearing, VSS, only mild ttp on exam, and no concern for pyelo.  Did give her a refill on her levothyroxine since she should be out, labs were done earlier this year, no med changes, and no sx of chemical hypothyroid.  She is going to schedule her overdue f/up with PCP when she leaves today, she has not been going out much due to [place] pandemic.      Delsa Grana, PA-C 07/11/19 11:01 AM

## 2019-07-11 NOTE — Patient Instructions (Signed)
Take antibiotics starting today and you can continue to take AZO if it helps with your symptoms.  Push lots of clear fluids.  We will call you with results   Urinary Tract Infection, Adult A urinary tract infection (UTI) is an infection of any part of the urinary tract. The urinary tract includes:  The kidneys.  The ureters.  The bladder.  The urethra. These organs make, store, and get rid of pee (urine) in the body. What are the causes? This is caused by germs (bacteria) in your genital area. These germs grow and cause swelling (inflammation) of your urinary tract. What increases the risk? You are more likely to develop this condition if:  You have a small, thin tube (catheter) to drain pee.  You cannot control when you pee or poop (incontinence).  You are female, and: ? You use these methods to prevent pregnancy: ? A medicine that kills sperm (spermicide). ? A device that blocks sperm (diaphragm). ? You have low levels of a female hormone (estrogen). ? You are pregnant.  You have genes that add to your risk.  You are sexually active.  You take antibiotic medicines.  You have trouble peeing because of: ? A prostate that is bigger than normal, if you are female. ? A blockage in the part of your body that drains pee from the bladder (urethra). ? A kidney stone. ? A nerve condition that affects your bladder (neurogenic bladder). ? Not getting enough to drink. ? Not peeing often enough.  You have other conditions, such as: ? Diabetes. ? A weak disease-fighting system (immune system). ? Sickle cell disease. ? Gout. ? Injury of the spine. What are the signs or symptoms? Symptoms of this condition include:  Needing to pee right away (urgently).  Peeing often.  Peeing small amounts often.  Pain or burning when peeing.  Blood in the pee.  Pee that smells bad or not like normal.  Trouble peeing.  Pee that is cloudy.  Fluid coming from the vagina, if you  are female.  Pain in the belly or lower back. Other symptoms include:  Throwing up (vomiting).  No urge to eat.  Feeling mixed up (confused).  Being tired and grouchy (irritable).  A fever.  Watery poop (diarrhea). How is this treated? This condition may be treated with:  Antibiotic medicine.  Other medicines.  Drinking enough water. Follow these instructions at home:  Medicines  Take over-the-counter and prescription medicines only as told by your doctor.  If you were prescribed an antibiotic medicine, take it as told by your doctor. Do not stop taking it even if you start to feel better. General instructions  Make sure you: ? Pee until your bladder is empty. ? Do not hold pee for a long time. ? Empty your bladder after sex. ? Wipe from front to back after pooping if you are a female. Use each tissue one time when you wipe.  Drink enough fluid to keep your pee pale yellow.  Keep all follow-up visits as told by your doctor. This is important. Contact a doctor if:  You do not get better after 1-2 days.  Your symptoms go away and then come back. Get help right away if:  You have very bad back pain.  You have very bad pain in your lower belly.  You have a fever.  You are sick to your stomach (nauseous).  You are throwing up. Summary  A urinary tract infection (UTI) is an infection of  any part of the urinary tract.  This condition is caused by germs in your genital area.  There are many risk factors for a UTI. These include having a small, thin tube to drain pee and not being able to control when you pee or poop.  Treatment includes antibiotic medicines for germs.  Drink enough fluid to keep your pee pale yellow. This information is not intended to replace advice given to you by your health care provider. Make sure you discuss any questions you have with your health care provider. Document Released: 04/11/2008 Document Revised: 10/11/2018 Document  Reviewed: 05/03/2018 Elsevier Patient Education  2020 [employer].

## 2019-07-13 LAB — URINALYSIS, ROUTINE W REFLEX MICROSCOPIC
Bacteria, UA: NONE SEEN /HPF
Bilirubin Urine: NEGATIVE
Glucose, UA: NEGATIVE
Hyaline Cast: NONE SEEN /LPF
Ketones, ur: NEGATIVE
Nitrite: NEGATIVE
Protein, ur: NEGATIVE
RBC / HPF: NONE SEEN /HPF (ref 0–2)
Specific Gravity, Urine: 1.008 (ref 1.001–1.03)
pH: >=8.5 — AB (ref 5.0–8.0)

## 2019-07-13 LAB — URINE CULTURE
MICRO NUMBER:: 845622
SPECIMEN QUALITY:: ADEQUATE

## 2019-08-16 ENCOUNTER — Ambulatory Visit (INDEPENDENT_AMBULATORY_CARE_PROVIDER_SITE_OTHER): Payer: Medicare Other | Admitting: Family Medicine

## 2019-08-16 ENCOUNTER — Other Ambulatory Visit: Payer: Self-pay

## 2019-08-16 ENCOUNTER — Encounter: Payer: Self-pay | Admitting: Family Medicine

## 2019-08-16 VITALS — BP 110/66 | HR 72 | Temp 97.9°F | Resp 14 | Ht 63.0 in | Wt 164.8 lb

## 2019-08-16 DIAGNOSIS — E039 Hypothyroidism, unspecified: Secondary | ICD-10-CM

## 2019-08-16 DIAGNOSIS — Z9011 Acquired absence of right breast and nipple: Secondary | ICD-10-CM | POA: Diagnosis not present

## 2019-08-16 DIAGNOSIS — Z23 Encounter for immunization: Secondary | ICD-10-CM | POA: Diagnosis not present

## 2019-08-16 DIAGNOSIS — Z13220 Encounter for screening for lipoid disorders: Secondary | ICD-10-CM | POA: Diagnosis not present

## 2019-08-16 DIAGNOSIS — Z5181 Encounter for therapeutic drug level monitoring: Secondary | ICD-10-CM

## 2019-08-16 NOTE — Progress Notes (Signed)
Name: [name]   MRN: [id number]    DOB: 02/02/1946   Date:08/16/2019       Progress Note  Chief Complaint  Patient presents with   Follow-up   Hypothyroidism   Medication Refill     Subjective:   Nicole Contreras is a 73 y.o. female, presents to clinic for routine follow up on the conditions listed above.  Hypothyroid: No weight hair skin changes, good energy, taking 75 mcg in the morning before breakfast.  Not waiting after meds to eat.  Also takes her multivitamin and extra vit d with it in the morning.    Hx of right mastectomy - pt request prescription for right breast prothesis - hasn't had a new or replacement one in 3+ years.    Recently had [place], has not get arranged to get mammogram done - will call  She is willing to do flu shot and pneummovax today  Patient Active Problem List   Diagnosis Date Noted   History of mastectomy, right 11/27/2018   Internal hemorrhoids 12/01/2016   Special screening for malignant neoplasms, colon    Benign neoplasm of transverse colon    Muscle spasms of neck 11/30/2015   Numerous moles 11/30/2015   Gastro-esophageal reflux disease without esophagitis 09/14/2015   Acquired hypothyroidism 09/14/2015   Menopause 09/14/2015   Overweight 09/14/2015   Hemorrhage, postmenopausal 09/14/2015   Vitamin D deficiency 09/14/2015   History of breast cancer 09/14/2015   Psoriatic arthritis ([place]) 05/22/2014    Past Surgical History:  Procedure Laterality Date   BREAST BIOPSY Left 1990   X 2- neg   CARDIAC CATHETERIZATION  [facility]   [facility]; no stent   CATARACT EXTRACTION W/PHACO Right 02/04/2016   Procedure: CATARACT EXTRACTION PHACO AND INTRAOCULAR LENS PLACEMENT ([place]);  Surgeon: Birder Robson, MD;  Location: [facility];  Service: Ophthalmology;  Laterality: Right;  [place] 00:44 AP% 19.2 CDE 8.49 fluid pack lot # [id number]   CATARACT EXTRACTION W/PHACO Left 06/12/2018   Procedure: CATARACT EXTRACTION PHACO AND  INTRAOCULAR LENS PLACEMENT ([facility]);  Surgeon: Birder Robson, MD;  Location: [facility];  Service: Ophthalmology;  Laterality: Left;  [place] 00:56.8 AP% 15.8 CDE 8.95 Fluid Pack lot # [id number]   colonoscopy     COLONOSCOPY WITH PROPOFOL N/A 07/15/2016   Procedure: COLONOSCOPY WITH PROPOFOL;  Surgeon: [name], MD;  Location: [place];  Service: Endoscopy;  Laterality: N/A;   ENDOMETRIAL BIOPSY     MASTECTOMY Right 1985   POLYPECTOMY N/A 07/15/2016   Procedure: POLYPECTOMY;  Surgeon: [name], MD;  Location: [place];  Service: Endoscopy;  Laterality: N/A;    Family History  Problem Relation Age of Onset   Uterine cancer Mother    Lung cancer Mother    Stroke Mother    Hypertension Mother    Dementia Mother    Heart disease Father    Arrhythmia Father        afib   Cervical cancer Sister    Arrhythmia Sister        afib   Breast cancer Sister    Breast cancer Paternal Aunt 22    Social History   Socioeconomic History   Marital status: Married    Spouse name: Quillian Quince   Number of children: 2   Years of education: Not on file   Highest education level: Associate degree: occupational, Hotel manager, or vocational program  Occupational History   Occupation: Retired  Scientist, product/process development strain: Not hard  at all   Food insecurity    Worry: Never true    Inability: Never true   Transportation needs    Medical: No    Non-medical: No  Tobacco Use   Smoking status: Former Smoker    Packs/day: 0.25    Years: 10.00    Pack years: 2.50    Types: Cigarettes    Quit date: 11/07/1990    Years since quitting: 28.7   Smokeless tobacco: Never Used   Tobacco comment: smoking cessation materials not required  Substance and Sexual Activity   Alcohol use: No    Alcohol/week: 0.0 standard drinks   Drug use: No   Sexual activity: Not Currently  Lifestyle   Physical activity    Days per week: 3 days    Minutes per session:  30 min   Stress: Not at all  Relationships   Social connections    Talks on phone: More than three times a week    Gets together: More than three times a week    Attends religious service: Never    Active member of club or organization: No    Attends meetings of clubs or organizations: Never    Relationship status: Married   Intimate partner violence    Fear of current or ex partner: No    Emotionally abused: No    Physically abused: No    Forced sexual activity: No  Other Topics Concern   Not on file  Social History Narrative   Not on file     Current Outpatient Medications:    albuterol (PROVENTIL HFA;VENTOLIN HFA) 108 (90 Base) MCG/ACT inhaler, Inhale 2 puffs into the lungs every 6 (six) hours as needed for wheezing or shortness of breath., Disp: 1 Inhaler, Rfl: 0   cholecalciferol (VITAMIN D) 1000 units tablet, Take 1,000 Units by mouth daily. , Disp: , Rfl:    Clobetasol Propionate (TEMOVATE) 0.05 % external spray, Apply topically 2 (two) times daily. (Patient taking differently: Apply 1 application topically 2 (two) times daily as needed (for psoriasis). ), Disp: 59 mL, Rfl: 0   levothyroxine (SYNTHROID) 75 MCG tablet, Take 1 tablet (75 mcg total) by mouth daily., Disp: 90 tablet, Rfl: 1   MAGNESIUM PO, Take 2 each by mouth daily as needed (for stress)., Disp: , Rfl:    Multiple Vitamin (MULTIVITAMIN) tablet, Take 1 tablet by mouth daily., Disp: , Rfl:    methotrexate 250 MG/10ML injection, Inject 22.5 mg into the muscle See admin instructions. Inject 22.5 mg IM weekly if needed for inflammation., Disp: , Rfl:   Allergies  Allergen Reactions   Sulfa Antibiotics Rash    I personally reviewed active problem list, medication list, allergies, family history, social history, health maintenance, notes from last encounter, lab results with the patient/caregiver today.  Review of Systems  Constitutional: Negative.   HENT: Negative.   Eyes: Negative.   Respiratory:  Negative.   Cardiovascular: Negative.   Gastrointestinal: Negative.   Endocrine: Negative.   Genitourinary: Negative.   Musculoskeletal: Negative.   Skin: Negative.   Allergic/Immunologic: Negative.   Neurological: Negative.   Hematological: Negative.   Psychiatric/Behavioral: Negative.   All other systems reviewed and are negative.    Objective:    Vitals:   08/16/19 1020  BP: 110/66  Pulse: 72  Resp: 14  Temp: 97.9 F (36.6 C)  SpO2: 98%  Weight: 164 lb 12.8 oz (74.8 kg)  Height: 5\' 3"  (1.6 m)    Body mass index is 29.19  kg/m.  Physical Exam Vitals signs and nursing note reviewed.  Constitutional:      General: She is not in acute distress.    Appearance: Normal appearance. She is well-developed. She is not ill-appearing, toxic-appearing or diaphoretic.     Interventions: Face mask in place.  HENT:     Head: Normocephalic and atraumatic.     Right Ear: External ear normal.     Left Ear: External ear normal.  Eyes:     General: Lids are normal. No scleral icterus.       Right eye: No discharge.        Left eye: No discharge.     Conjunctiva/sclera: Conjunctivae normal.  Neck:     Musculoskeletal: Normal range of motion and neck supple.     Trachea: Phonation normal. No tracheal deviation.  Cardiovascular:     Rate and Rhythm: Normal rate and regular rhythm.     Pulses: Normal pulses.          Radial pulses are 2+ on the right side and 2+ on the left side.       Posterior tibial pulses are 2+ on the right side and 2+ on the left side.     Heart sounds: Normal heart sounds. No murmur. No friction rub. No gallop.   Pulmonary:     Effort: Pulmonary effort is normal. No respiratory distress.     Breath sounds: Normal breath sounds. No stridor. No wheezing, rhonchi or rales.  Chest:     Chest wall: No tenderness.  Abdominal:     General: Bowel sounds are normal. There is no distension.     Palpations: Abdomen is soft.     Tenderness: There is no abdominal  tenderness. There is no guarding or rebound.  Musculoskeletal: Normal range of motion.        General: No deformity.     Right lower leg: No edema.     Left lower leg: No edema.  Lymphadenopathy:     Cervical: No cervical adenopathy.  Skin:    General: Skin is warm and dry.     Capillary Refill: Capillary refill takes less than 2 seconds.     Coloration: Skin is not jaundiced or pale.     Findings: No rash.  Neurological:     Mental Status: She is alert and oriented to person, place, and time.     Motor: No abnormal muscle tone.     Gait: Gait normal.  Psychiatric:        Mood and Affect: Mood normal.        Speech: Speech normal.        Behavior: Behavior normal.      Recent Results (from the past 2160 hour(s))  Urine Culture     Status: Abnormal   Collection Time: 07/11/19 12:00 AM   Specimen: Urine  Result Value Ref Range   MICRO NUMBER: [id number]    SPECIMEN QUALITY: Adequate    Sample Source URINE, CLEAN CATCH    STATUS: FINAL    ISOLATE 1: Proteus mirabilis (A)     Comment: Greater than 100,000 CFU/mL of Proteus mirabilis      Susceptibility   Proteus mirabilis - URINE CULTURE, REFLEX    AMOX/CLAVULANIC 4 Sensitive     AMPICILLIN <=2 Sensitive     AMPICILLIN/SULBACTAM <=2 Sensitive     CEFAZOLIN* <=4 Not Reportable      * For infections other than uncomplicated UTIcaused by E. coli, K. pneumoniae or P.  mirabilis:Cefazolin is resistant if MIC > or = 8 mcg/mL.(Distinguishing susceptible versus intermediatefor isolates with MIC < or = 4 mcg/mL requiresadditional testing.)For uncomplicated UTI caused by E. coli,K. pneumoniae or P. mirabilis: Cefazolin issusceptible if MIC <32 mcg/mL and predictssusceptible to the oral agents cefaclor, cefdinir,cefpodoxime, cefprozil, cefuroxime, cephalexinand loracarbef.    CEFEPIME <=1 Sensitive     CEFTRIAXONE <=1 Sensitive     CIPROFLOXACIN <=0.25 Sensitive     LEVOFLOXACIN <=0.12 Sensitive     ERTAPENEM <=0.5 Sensitive      GENTAMICIN <=1 Sensitive     IMIPENEM 1 Sensitive     NITROFURANTOIN 128 Resistant     PIP/TAZO <=4 Sensitive     TOBRAMYCIN <=1 Sensitive     TRIMETH/SULFA* <=20 Sensitive      * For infections other than uncomplicated UTIcaused by E. coli, K. pneumoniae or P. mirabilis:Cefazolin is resistant if MIC > or = 8 mcg/mL.(Distinguishing susceptible versus intermediatefor isolates with MIC < or = 4 mcg/mL requiresadditional testing.)For uncomplicated UTI caused by E. coli,K. pneumoniae or P. mirabilis: Cefazolin issusceptible if MIC <32 mcg/mL and predictssusceptible to the oral agents cefaclor, cefdinir,cefpodoxime, cefprozil, cefuroxime, cephalexinand loracarbef.Legend:S = Susceptible  I = IntermediateR = Resistant  NS = Not susceptible* = Not tested  NR = Not reported**NN = See antimicrobic comments  Urinalysis, Routine w reflex microscopic     Status: Abnormal   Collection Time: 07/11/19 12:00 AM  Result Value Ref Range   Color, Urine YELLOW YELLOW   APPearance CLEAR CLEAR   Specific Gravity, Urine 1.008 1.001 - 1.03   pH > OR = 8.5 (A) 5.0 - 8.0   Glucose, UA NEGATIVE NEGATIVE   Bilirubin Urine NEGATIVE NEGATIVE   Ketones, ur NEGATIVE NEGATIVE   Hgb urine dipstick TRACE (A) NEGATIVE   Protein, ur NEGATIVE NEGATIVE   Nitrite NEGATIVE NEGATIVE   Leukocytes,Ua 3+ (A) NEGATIVE   WBC, UA 0-5 0 - 5 /HPF   RBC / HPF NONE SEEN 0 - 2 /HPF   Squamous Epithelial / LPF 0-5 < OR = 5 /HPF   Bacteria, UA NONE SEEN NONE SEEN /HPF   Hyaline Cast NONE SEEN NONE SEEN /LPF     PHQ2/9: Depression screen [facility] 2/9 08/16/2019 07/11/2019 03/14/2019 01/04/2019 11/27/2018  Decreased Interest 0 0 0 0 0  Down, Depressed, Hopeless 0 0 0 0 0  PHQ - 2 Score 0 0 0 0 0  Altered sleeping 0 0 - 0 0  Tired, decreased energy 0 0 - 0 0  Change in appetite 0 0 - 0 0  Feeling bad or failure about yourself  0 0 - 0 0  Trouble concentrating 0 0 - 0 0  Moving slowly or fidgety/restless 0 0 - 0 0  Suicidal thoughts 0 - - 0 0    PHQ-9 Score 0 0 - 0 0  Difficult doing work/chores Not difficult at all Not difficult at all - Not difficult at all Not difficult at all    phq 9 is negative reviewed  Fall Risk: Fall Risk  08/16/2019 07/11/2019 03/14/2019 01/04/2019 11/27/2018  Falls in the past year? 0 0 0 0 0  Number falls in past yr: 0 0 0 - 0  Injury with Fall? 0 0 0 - 0  Risk for fall due to : - - - - -  Risk for fall due to: Comment - - - - -  Follow up - - Falls prevention discussed - Falls evaluation completed      Functional  Status Survey: Is the patient deaf or have difficulty hearing?: No Does the patient have difficulty seeing, even when wearing glasses/contacts?: No Does the patient have difficulty concentrating, remembering, or making decisions?: No Does the patient have difficulty walking or climbing stairs?: No Does the patient have difficulty dressing or bathing?: No Does the patient have difficulty doing errands alone such as visiting a doctor's office or shopping?: No    Assessment & Plan:       ICD-10-CM   1. Acquired hypothyroidism  E03.9 TSH    COMPLETE METABOLIC PANEL WITH GFR    CBC with Differential/Platelet   no sx or recent dose changes, check tsh   2. History of mastectomy, right  Z90.11    Rx for prosthesis given today  3. Screening for lipoid disorders  [id number] COMPLETE METABOLIC PANEL WITH GFR    Lipid panel   no labs for lipid panel for 5 years, will do screenings labs  4. Encounter for medication monitoring  Z51.81 TSH    COMPLETE METABOLIC PANEL WITH GFR    Lipid panel    CBC with Differential/Platelet   screening labs with hypothyroid  5. Need for influenza vaccination  Z23 Flu Vaccine QUAD High Dose(Fluad)  6. Need for pneumococcal vaccination  Z23 Pneumococcal polysaccharide vaccine 23-valent greater than or equal to 2yo subcutaneous/IM    Return in about 6 months (around 02/14/2020) for  , Routine follow-up with PCP.   Delsa Grana, PA-C 08/16/19 10:27 AM

## 2019-08-17 LAB — CBC WITH DIFFERENTIAL/PLATELET
Absolute Monocytes: 490 cells/uL (ref 200–950)
Basophils Absolute: 50 cells/uL (ref 0–200)
Basophils Relative: 0.6 %
Eosinophils Absolute: 91 cells/uL (ref 15–500)
Eosinophils Relative: 1.1 %
HCT: 38.2 % (ref 35.0–45.0)
Hemoglobin: 12.5 g/dL (ref 11.7–15.5)
Lymphs Abs: 1801 cells/uL (ref 850–3900)
MCH: 27.7 pg (ref 27.0–33.0)
MCHC: 32.7 g/dL (ref 32.0–36.0)
MCV: 84.5 fL (ref 80.0–100.0)
MPV: 9.7 fL (ref 7.5–12.5)
Monocytes Relative: 5.9 %
Neutro Abs: 5868 cells/uL (ref 1500–7800)
Neutrophils Relative %: 70.7 %
Platelets: 298 10*3/uL (ref 140–400)
RBC: 4.52 10*6/uL (ref 3.80–5.10)
RDW: 13.2 % (ref 11.0–15.0)
Total Lymphocyte: 21.7 %
WBC: 8.3 10*3/uL (ref 3.8–10.8)

## 2019-08-17 LAB — COMPLETE METABOLIC PANEL WITH GFR
AG Ratio: 1.6 (calc) (ref 1.0–2.5)
ALT: 17 U/L (ref 6–29)
AST: 18 U/L (ref 10–35)
Albumin: 3.9 g/dL (ref 3.6–5.1)
Alkaline phosphatase (APISO): 83 U/L (ref 37–153)
BUN: 20 mg/dL (ref 7–25)
CO2: 28 mmol/L (ref 20–32)
Calcium: 9.2 mg/dL (ref 8.6–10.4)
Chloride: 102 mmol/L (ref 98–110)
Creat: 0.78 mg/dL (ref 0.60–0.93)
GFR, Est African American: 89 mL/min/{1.73_m2} (ref >=60)
GFR, Est Non African American: 77 mL/min/{1.73_m2} (ref >=60)
Globulin: 2.5 g/dL (calc) (ref 1.9–3.7)
Glucose, Bld: 99 mg/dL (ref 65–99)
Potassium: 4.3 mmol/L (ref 3.5–5.3)
Sodium: 138 mmol/L (ref 135–146)
Total Bilirubin: 0.4 mg/dL (ref 0.2–1.2)
Total Protein: 6.4 g/dL (ref 6.1–8.1)

## 2019-08-17 LAB — LIPID PANEL
Cholesterol: 124 mg/dL (ref <200)
HDL: 68 mg/dL (ref >=50)
LDL Cholesterol (Calc): 38 mg/dL (calc)
Non-HDL Cholesterol (Calc): 56 mg/dL (calc) (ref <130)
Total CHOL/HDL Ratio: 1.8 (calc) (ref <5.0)
Triglycerides: 96 mg/dL (ref <150)

## 2019-08-17 LAB — TSH: TSH: 0.74 mIU/L (ref 0.40–4.50)

## 2019-11-28 DIAGNOSIS — H6123 Impacted cerumen, bilateral: Secondary | ICD-10-CM | POA: Diagnosis not present

## 2019-11-28 DIAGNOSIS — H900 Conductive hearing loss, bilateral: Secondary | ICD-10-CM | POA: Diagnosis not present

## 2020-02-20 ENCOUNTER — Telehealth: Payer: Self-pay

## 2020-02-20 DIAGNOSIS — C50111 Malignant neoplasm of central portion of right female breast: Secondary | ICD-10-CM | POA: Diagnosis not present

## 2020-02-20 DIAGNOSIS — Z4431 Encounter for fitting and adjustment of external right breast prosthesis: Secondary | ICD-10-CM | POA: Diagnosis not present

## 2020-02-20 NOTE — Telephone Encounter (Signed)
Copied from [place] [id number]. Topic: General - Other >> Feb 20, 2020 10:58 AM Rainey Pines A wrote: Helene Kelp from [place] called in regards to Mastectomy supplies needed for patient. And stated that its required to have most recent office visit notes faxed to [phone] attn Helene Kelp

## 2020-02-21 NOTE — Telephone Encounter (Signed)
appt scheduled

## 2020-03-03 ENCOUNTER — Encounter: Payer: Self-pay | Admitting: Family Medicine

## 2020-03-03 ENCOUNTER — Other Ambulatory Visit: Payer: Self-pay

## 2020-03-03 ENCOUNTER — Ambulatory Visit: Payer: Medicare Other | Admitting: Family Medicine

## 2020-03-03 VITALS — BP 140/82 | HR 77 | Temp 97.1°F | Resp 16 | Ht 66.75 in | Wt 174.9 lb

## 2020-03-03 DIAGNOSIS — E039 Hypothyroidism, unspecified: Secondary | ICD-10-CM

## 2020-03-03 DIAGNOSIS — L4050 Arthropathic psoriasis, unspecified: Secondary | ICD-10-CM | POA: Diagnosis not present

## 2020-03-03 DIAGNOSIS — Z780 Asymptomatic menopausal state: Secondary | ICD-10-CM

## 2020-03-03 DIAGNOSIS — L409 Psoriasis, unspecified: Secondary | ICD-10-CM | POA: Diagnosis not present

## 2020-03-03 DIAGNOSIS — Z9011 Acquired absence of right breast and nipple: Secondary | ICD-10-CM

## 2020-03-03 DIAGNOSIS — M8580 Other specified disorders of bone density and structure, unspecified site: Secondary | ICD-10-CM | POA: Diagnosis not present

## 2020-03-03 MED ORDER — LEVOTHYROXINE SODIUM 75 MCG PO TABS: 75.0000 ug | ORAL_TABLET | Freq: Every day | ORAL | 1 refills | Status: DC

## 2020-03-03 NOTE — Progress Notes (Signed)
Name: Nicole Contreras   MRN: [id number]    DOB: 06-05-1946   Date:03/03/2020       Progress Note  Subjective  Chief Complaint  Chief Complaint  Patient presents with  . Mastectomy supplies    HPI  Psoriatic arthritis:  She had dysuria with Sulfasalazine, not very compliant with methotrexate and Enbrel was stopped because of cost. Dr. Jefm Bryant discussed other options with her during her last visit 06/2019 but she decided not to change or add any medications. She states the arthralgias are mild 1/10, able to do all ADL, it does affect her quality of life  Psoriasis: she sees Dr. Kellie Moor, unable to see her notes. She is using topical medication and worse is on her elbows. Also uses a solution on her scalp , that is the only spot that itches   Hypothyroidism: taking medication daily without food, no palpitation or dysphagia, denies change in bowel movements. Weight has been stable.   History of breast cancer: she had right mastectomy in 1985, had local recurrence in 1986 had recurrence on her skin and had to have chemo and radiation. She never had re-constructive surgery at the time because of the chemo and radiation, she uses a prostesis and needs a refill today    Patient Active Problem List   Diagnosis Date Noted  . History of mastectomy, right 11/27/2018  . Internal hemorrhoids 12/01/2016  . Special screening for malignant neoplasms, colon   . Benign neoplasm of transverse colon   . Muscle spasms of neck 11/30/2015  . Numerous moles 11/30/2015  . Gastro-esophageal reflux disease without esophagitis 09/14/2015  . Acquired hypothyroidism 09/14/2015  . Menopause 09/14/2015  . Overweight 09/14/2015  . Hemorrhage, postmenopausal 09/14/2015  . Vitamin D deficiency 09/14/2015  . History of breast cancer 09/14/2015  . Psoriatic arthritis ([place]) 05/22/2014    Past Surgical History:  Procedure Laterality Date  . BREAST BIOPSY Left 1990   X 2- neg  . CARDIAC CATHETERIZATION   [facility]   [facility]; no stent  . CATARACT EXTRACTION W/PHACO Right 02/04/2016   Procedure: CATARACT EXTRACTION PHACO AND INTRAOCULAR LENS PLACEMENT ([facility]);  Surgeon: Birder Robson, MD;  Location: [facility];  Service: Ophthalmology;  Laterality: Right;  [place] 00:44 AP% 19.2 CDE 8.49 fluid pack lot # [id number]  . CATARACT EXTRACTION W/PHACO Left 06/12/2018   Procedure: CATARACT EXTRACTION PHACO AND INTRAOCULAR LENS PLACEMENT ([facility]);  Surgeon: Birder Robson, MD;  Location: [facility];  Service: Ophthalmology;  Laterality: Left;  [place] 00:56.8 AP% 15.8 CDE 8.95 Fluid Pack lot # [id number]  . colonoscopy    . COLONOSCOPY WITH PROPOFOL N/A 07/15/2016   Procedure: COLONOSCOPY WITH PROPOFOL;  Surgeon: [name], MD;  Location: [place];  Service: Endoscopy;  Laterality: N/A;  . ENDOMETRIAL BIOPSY    . MASTECTOMY Right 1985  . POLYPECTOMY N/A 07/15/2016   Procedure: POLYPECTOMY;  Surgeon: [name], MD;  Location: [place];  Service: Endoscopy;  Laterality: N/A;    Family History  Problem Relation Age of Onset  . Uterine cancer Mother   . Lung cancer Mother   . Stroke Mother   . Hypertension Mother   . Dementia Mother   . Heart disease Father   . Arrhythmia Father        afib  . Cervical cancer Sister   . Arrhythmia Sister        afib  . Breast cancer Sister   . Breast cancer Paternal Aunt 49    Social History  Tobacco Use  . Smoking status: Former Smoker    Packs/day: 0.25    Years: 10.00    Pack years: 2.50    Types: Cigarettes    Quit date: 11/07/1990    Years since quitting: 29.3  . Smokeless tobacco: Never Used  . Tobacco comment: smoking cessation materials not required  Substance Use Topics  . Alcohol use: No    Alcohol/week: 0.0 standard drinks     Current Outpatient Medications:  .  albuterol (PROVENTIL HFA;VENTOLIN HFA) 108 (90 Base) MCG/ACT inhaler, Inhale 2 puffs into the lungs every 6 (six) hours as needed for wheezing or shortness of breath., Disp: 1  Inhaler, Rfl: 0 .  cholecalciferol (VITAMIN D) 1000 units tablet, Take 1,000 Units by mouth daily. , Disp: , Rfl:  .  Clobetasol Propionate (TEMOVATE) 0.05 % external spray, Apply topically 2 (two) times daily. (Patient taking differently: Apply 1 application topically 2 (two) times daily as needed (for psoriasis). ), Disp: 59 mL, Rfl: 0 .  levothyroxine (SYNTHROID) 75 MCG tablet, Take 1 tablet (75 mcg total) by mouth daily., Disp: 90 tablet, Rfl: 1 .  MAGNESIUM PO, Take 2 each by mouth daily as needed (for stress)., Disp: , Rfl:  .  methotrexate 250 MG/10ML injection, Inject 22.5 mg into the muscle See admin instructions. Inject 22.5 mg IM weekly if needed for inflammation., Disp: , Rfl:  .  Multiple Vitamin (MULTIVITAMIN) tablet, Take 1 tablet by mouth daily., Disp: , Rfl:   Allergies  Allergen Reactions  . Sulfa Antibiotics Rash    I personally reviewed active problem list, medication list, allergies, family history, social history, health maintenance with the patient/caregiver today.   ROS  Constitutional: Negative for fever or weight change.  Respiratory: Negative for cough and shortness of breath.   Cardiovascular: Negative for chest pain or palpitations.  Gastrointestinal: Negative for abdominal pain, no bowel changes.  Musculoskeletal: Negative for gait problem , positive for intermittent joint swelling.  Skin: positive for rash.  Neurological: Negative for dizziness or headache.  No other specific complaints in a complete review of systems (except as listed in HPI above).  Objective  Vitals:   03/03/20 0812  BP: 140/88  Pulse: 77  Resp: 16  Temp: (!) 97.1 F (36.2 C)  TempSrc: Temporal  SpO2: 99%  Weight: 174 lb 14.4 oz (79.3 kg)  Height: 5\' 3"  (1.6 m)    Body mass index is 30.98 kg/m.  Physical Exam  Constitutional: Patient appears well-developed and well-nourished. Obese  No distress.  HEENT: head atraumatic, normocephalic, pupils equal and reactive to  light Cardiovascular: Normal rate, regular rhythm and normal heart sounds.  No murmur heard. No BLE edema. Pulmonary/Chest: Effort normal and breath sounds normal. No respiratory distress. Abdominal: Soft.  There is no tenderness. Muscular Skeletal: hand deformities Skin: psoriatic plaques on elbows  Psychiatric: Patient has a normal mood and affect. behavior is normal. Judgment and thought content normal.  PHQ2/9: Depression screen [facility] 2/9 03/03/2020 08/16/2019 07/11/2019 03/14/2019 01/04/2019  Decreased Interest 0 0 0 0 0  Down, Depressed, Hopeless 0 0 0 0 0  PHQ - 2 Score 0 0 0 0 0  Altered sleeping 0 0 0 - 0  Tired, decreased energy 0 0 0 - 0  Change in appetite 0 0 0 - 0  Feeling bad or failure about yourself  0 0 0 - 0  Trouble concentrating 0 0 0 - 0  Moving slowly or fidgety/restless 0 0 0 - 0  Suicidal  thoughts 0 0 - - 0  PHQ-9 Score 0 0 0 - 0  Difficult doing work/chores - Not difficult at all Not difficult at all - Not difficult at all    phq 9 is negative   Fall Risk: Fall Risk  03/03/2020 08/16/2019 07/11/2019 03/14/2019 01/04/2019  Falls in the past year? 0 0 0 0 0  Number falls in past yr: 0 0 0 0 -  Injury with Fall? 0 0 0 0 -  Risk for fall due to : - - - - -  Risk for fall due to: Comment - - - - -  Follow up - - - Falls prevention discussed -     Functional Status Survey: Is the patient deaf or have difficulty hearing?: No Does the patient have difficulty seeing, even when wearing glasses/contacts?: No Does the patient have difficulty concentrating, remembering, or making decisions?: No Does the patient have difficulty walking or climbing stairs?: No Does the patient have difficulty dressing or bathing?: No Does the patient have difficulty doing errands alone such as visiting a doctor's office or shopping?: No    Assessment & Plan  1. Acquired hypothyroidism  - levothyroxine (SYNTHROID) 75 MCG tablet; Take 1 tablet (75 mcg total) by mouth daily.  Dispense: 90  tablet; Refill: 1  2. Psoriatic arthritis ([place])  Seeing Dr. Jefm Bryant   3. History of mastectomy, right  We will send rx to [facility] today   4. Osteopenia after menopause  She is due for repeat, advised her to call and schedule it   5. Psoriasis  Continue follow up with Dermatologist

## 2020-03-12 ENCOUNTER — Other Ambulatory Visit: Payer: Self-pay | Admitting: Family Medicine

## 2020-03-12 DIAGNOSIS — Z1231 Encounter for screening mammogram for malignant neoplasm of breast: Secondary | ICD-10-CM

## 2020-03-17 ENCOUNTER — Ambulatory Visit
Admission: RE | Admit: 2020-03-17 | Discharge: 2020-03-17 | Disposition: A | Discharge status: Home / Self Care | Payer: Medicare PPO | Source: Ambulatory Visit | Attending: Family Medicine | Admitting: Family Medicine

## 2020-03-17 DIAGNOSIS — Z1231 Encounter for screening mammogram for malignant neoplasm of breast: Secondary | ICD-10-CM | POA: Diagnosis not present

## 2020-05-08 ENCOUNTER — Other Ambulatory Visit: Payer: Self-pay | Admitting: Family Medicine

## 2020-05-08 DIAGNOSIS — M8580 Other specified disorders of bone density and structure, unspecified site: Secondary | ICD-10-CM

## 2020-05-08 DIAGNOSIS — Z1231 Encounter for screening mammogram for malignant neoplasm of breast: Secondary | ICD-10-CM

## 2020-05-15 DIAGNOSIS — D2261 Melanocytic nevi of right upper limb, including shoulder: Secondary | ICD-10-CM | POA: Diagnosis not present

## 2020-05-15 DIAGNOSIS — L400 Psoriasis vulgaris: Secondary | ICD-10-CM | POA: Diagnosis not present

## 2020-05-15 DIAGNOSIS — L821 Other seborrheic keratosis: Secondary | ICD-10-CM | POA: Diagnosis not present

## 2020-05-15 DIAGNOSIS — D225 Melanocytic nevi of trunk: Secondary | ICD-10-CM | POA: Diagnosis not present

## 2020-05-15 DIAGNOSIS — D2272 Melanocytic nevi of left lower limb, including hip: Secondary | ICD-10-CM | POA: Diagnosis not present

## 2020-05-15 DIAGNOSIS — D2262 Melanocytic nevi of left upper limb, including shoulder: Secondary | ICD-10-CM | POA: Diagnosis not present

## 2020-05-15 DIAGNOSIS — D2271 Melanocytic nevi of right lower limb, including hip: Secondary | ICD-10-CM | POA: Diagnosis not present

## 2020-05-28 DIAGNOSIS — H6123 Impacted cerumen, bilateral: Secondary | ICD-10-CM | POA: Diagnosis not present

## 2020-05-28 DIAGNOSIS — H900 Conductive hearing loss, bilateral: Secondary | ICD-10-CM | POA: Diagnosis not present

## 2020-06-11 DIAGNOSIS — L4050 Arthropathic psoriasis, unspecified: Secondary | ICD-10-CM | POA: Diagnosis not present

## 2020-06-11 DIAGNOSIS — L409 Psoriasis, unspecified: Secondary | ICD-10-CM | POA: Diagnosis not present

## 2020-07-29 ENCOUNTER — Ambulatory Visit: Payer: Medicare PPO | Admitting: Family Medicine

## 2020-07-29 ENCOUNTER — Encounter: Payer: Self-pay | Admitting: Family Medicine

## 2020-07-29 ENCOUNTER — Ambulatory Visit: Payer: Self-pay

## 2020-07-29 ENCOUNTER — Other Ambulatory Visit: Payer: Self-pay

## 2020-07-29 DIAGNOSIS — M25562 Pain in left knee: Secondary | ICD-10-CM

## 2020-07-29 NOTE — Progress Notes (Signed)
I saw and examined the patient with Dr. Elouise Munroe and agree with assessment and plan as outlined.    Improving left knee pain.  Still has 1+ effusion, but minimal tenderness.  History of psoriatic arthritis.  Possibilities include DJD flare-up, or degenerative meniscus tear.    Will try advil for a couple days.  If flares up again, then cortisone injection.

## 2020-07-29 NOTE — Progress Notes (Signed)
Office Visit Note   Patient: Nicole Contreras           Date of Birth: 07-03-1946           MRN: [id number] Visit Date: 07/29/2020 Requested by: Steele Sizer, [address],  [place] [address] PCP: Steele Sizer, MD  Subjective: Chief Complaint  Patient presents with  . Left Knee - Pain    Pain and swelling in the posterior knee x a couple weeks - was having to go up/down the stairs a lot. Pain also med/lat aspects. Worse last week - hurt to stand on the left leg at times.     HPI: 74yo F presenting to clinic with concerns of L knee pain, which has been ongoing for 3-4 weeks. Patient states that her pain started when her [facility] went out, so she was staying in the basement of her home for the cool air- and thus walking up and down stairs many times throughout the day. She says that her knee started to swell, and she began to notice pain in the posterior portion of the joint. She also states that she felt as though her knee started to 'catch' on her- especially when raising from a seated position, or when turning on a planted foot. Initially, her knee hurt so badly she couldn't ambulate without assistance. She says that, over the past few days, her pain has started to improve- and today she's barely hurting at all.  No history of trauma to the joint. She has taken some Advil which offers improvement of her symptoms.              ROS:   All other systems were reviewed and are negative.  Objective: Vital Signs: There were no vitals taken for this visit.  Physical Exam:  General:  Alert and oriented, in no acute distress. Pulm:  Breathing unlabored. Psy:  Normal mood, congruent affect. Skin:  Raised, erythematous rash on lateral aspect of L leg with overlying scale. (consistent with Psoriatic rash, per patient)  LEFT KNEE EXAM:  General: Normal gait Standing exam: No varus or valgus deformity of the knee.   Seated Exam:  Minimal patellar crepitus.  Palpation:  No significant tenderness to palpation over medial or lateral joint lines. No tenderness with palpation of patella or patellar tendon. No tenderness over patellar facets.   Supine exam: Mild effusion, normal patellar mobility.   Ligamentous Exam:  No pain or laxity with anterior/posterior drawer.  No obvious Sag.  No pain or laxity with varus/valgus stress across the knee.   Meniscus:  McMurray with pain, most appreciated on the medial aspect- though no deep clicking.    Imaging: Xray- Left knee;  Mild joint space loss in both knees, worse along the medial aspect, with minimal sclerosis.  Minimal patellar spurring.   Impression: Mild degenerative changes  Assessment & Plan: 74yo F presenting to clinic with L knee pain x3-4, however this has already started to significantly improve by the time of her apt today. Remains with mild effusion, and has pain with mcmurray's. Given her report of her knee catching, there is concern for atraumatic meniscal tearing vs flare of her mild arthritis.  -Given improvement in symptoms, no need for injection therapy or advanced imaging at this time -If pain worsens, patient is instructed to RTC for reevaluation -Instructed to take Ibuprofen 600mg  TID for the next 5-7 days for remaining inflammation within the knee.  -Return precautions discussed -Patient had no  further questions/cocerns today.      Procedures: No procedures performed  No notes on file     PMFS History: Patient Active Problem List   Diagnosis Date Noted  . History of mastectomy, right 11/27/2018  . Internal hemorrhoids 12/01/2016  . Benign neoplasm of transverse colon   . Numerous moles 11/30/2015  . Gastro-esophageal reflux disease without esophagitis 09/14/2015  . Acquired hypothyroidism 09/14/2015  . Menopause 09/14/2015  . Overweight 09/14/2015  . Hemorrhage, postmenopausal 09/14/2015  . Vitamin D deficiency 09/14/2015  . History of breast cancer 09/14/2015  .  Psoriatic arthritis ([place]) 05/22/2014   Past Medical History:  Diagnosis Date  . Arthritis   . Atypical chest pain    a. 03/2014 ETT: Ex time 4:10, Max HR 160 bpm, no acute st/t changes.  . Breast cancer ([place]) 1986   s/p radiation.  . Bright's disease    Hx  . Diastolic dysfunction    a. 02/2014 Echo: EF 55-60%, no rwma, Gr1 DD.  . Dizziness and giddiness   . Fecal smearing   . GERD (gastroesophageal reflux disease)   . Heart murmur    as child  . Insomnia, unspecified   . Obesity, unspecified   . Personal history of chemotherapy   . Personal history of radiation therapy 1986   right breast ca  . PONV (postoperative nausea and vomiting)    after cataract procedure  . Postmenopausal bleeding   . Psoriasis   . Psoriatic arthropathy ([place])   . Reflux esophagitis   . Right cataract    a. 01/2016 s/p cataract surgery.  Marland Kitchen Spasm of muscle   . Unspecified hypothyroidism   . Unspecified urinary incontinence   . Unspecified vitamin D deficiency     Family History  Problem Relation Age of Onset  . Uterine cancer Mother   . Lung cancer Mother   . Stroke Mother   . Hypertension Mother   . Dementia Mother   . Heart disease Father   . Arrhythmia Father        afib  . Cervical cancer Sister   . Arrhythmia Sister        afib  . Breast cancer Sister 51  . Breast cancer Paternal Aunt 74    Past Surgical History:  Procedure Laterality Date  . BREAST EXCISIONAL BIOPSY Left 1990   X 2- neg  . CARDIAC CATHETERIZATION  90's   [facility]; no stent  . CATARACT EXTRACTION W/PHACO Right 02/04/2016   Procedure: CATARACT EXTRACTION PHACO AND INTRAOCULAR LENS PLACEMENT (IOC);  Surgeon: Birder Robson, MD;  Location: [facility] ORS;  Service: Ophthalmology;  Laterality: Right;  [place] 00:44 AP% 19.2 CDE 8.49 fluid pack lot # [id number]  . CATARACT EXTRACTION W/PHACO Left 06/12/2018   Procedure: CATARACT EXTRACTION PHACO AND INTRAOCULAR LENS PLACEMENT (IOC);  Surgeon: Birder Robson, MD;  Location: [facility]  ORS;  Service: Ophthalmology;  Laterality: Left;  [place] 00:56.8 AP% 15.8 CDE 8.95 Fluid Pack lot # [id number]  . colonoscopy    . COLONOSCOPY WITH PROPOFOL N/A 07/15/2016   Procedure: COLONOSCOPY WITH PROPOFOL;  Surgeon: [name], MD;  Location: [facility];  Service: Endoscopy;  Laterality: N/A;  . ENDOMETRIAL BIOPSY    . MASTECTOMY Right 1985  . POLYPECTOMY N/A 07/15/2016   Procedure: POLYPECTOMY;  Surgeon: [name], MD;  Location: [facility];  Service: Endoscopy;  Laterality: N/A;   Social History   Occupational History  . Occupation: Retired  Tobacco Use  . Smoking status: Former Smoker  Packs/day: 0.25    Years: 10.00    Pack years: 2.50    Types: Cigarettes    Quit date: 11/07/1990    Years since quitting: 29.7  . Smokeless tobacco: Never Used  . Tobacco comment: smoking cessation materials not required  Vaping Use  . Vaping Use: Never used  Substance and Sexual Activity  . Alcohol use: No    Alcohol/week: 0.0 standard drinks  . Drug use: No  . Sexual activity: Not Currently

## 2020-09-02 ENCOUNTER — Ambulatory Visit: Payer: Medicare PPO | Admitting: Family Medicine

## 2020-09-14 ENCOUNTER — Other Ambulatory Visit: Payer: Self-pay

## 2020-09-14 ENCOUNTER — Other Ambulatory Visit: Payer: Self-pay | Admitting: Family Medicine

## 2020-09-14 DIAGNOSIS — E039 Hypothyroidism, unspecified: Secondary | ICD-10-CM

## 2020-09-16 ENCOUNTER — Encounter: Payer: Self-pay | Admitting: Family Medicine

## 2020-09-16 ENCOUNTER — Other Ambulatory Visit: Payer: Self-pay

## 2020-09-16 ENCOUNTER — Ambulatory Visit: Payer: Medicare PPO | Admitting: Family Medicine

## 2020-09-16 VITALS — BP 138/62 | HR 95 | Temp 98.0°F | Resp 17 | Ht 67.0 in | Wt 179.0 lb

## 2020-09-16 DIAGNOSIS — E039 Hypothyroidism, unspecified: Secondary | ICD-10-CM | POA: Diagnosis not present

## 2020-09-16 DIAGNOSIS — T148XXA Other injury of unspecified body region, initial encounter: Secondary | ICD-10-CM

## 2020-09-16 DIAGNOSIS — L4050 Arthropathic psoriasis, unspecified: Secondary | ICD-10-CM

## 2020-09-16 DIAGNOSIS — L409 Psoriasis, unspecified: Secondary | ICD-10-CM

## 2020-09-16 DIAGNOSIS — M8580 Other specified disorders of bone density and structure, unspecified site: Secondary | ICD-10-CM | POA: Diagnosis not present

## 2020-09-16 DIAGNOSIS — Z23 Encounter for immunization: Secondary | ICD-10-CM | POA: Diagnosis not present

## 2020-09-16 DIAGNOSIS — Z9011 Acquired absence of right breast and nipple: Secondary | ICD-10-CM

## 2020-09-16 DIAGNOSIS — Z780 Asymptomatic menopausal state: Secondary | ICD-10-CM | POA: Diagnosis not present

## 2020-09-16 DIAGNOSIS — D692 Other nonthrombocytopenic purpura: Secondary | ICD-10-CM

## 2020-09-16 LAB — TSH: TSH: 1.3 mIU/L (ref 0.40–4.50)

## 2020-09-16 MED ORDER — LEVOTHYROXINE SODIUM 75 MCG PO TABS: 75.0000 ug | ORAL_TABLET | Freq: Every day | ORAL | 3 refills | Status: DC

## 2020-09-16 NOTE — Progress Notes (Signed)
Name: Nicole Contreras   MRN: [id number]    DOB: 1946-08-19   Date:09/16/2020       Progress Note  Subjective  Chief Complaint  Chief Complaint  Patient presents with  . Follow-up    HPI  Psoriatic arthritis:  She had dysuria with Sulfasalazine, Methotrexate was ineffective, she was on Enbrel but she stopped because she felt it always makes her sick, Dr. Jefm Bryant advised Humira but she does not want to take it at this time.  She had a recent flare with effusion and pain on left knee and went to Ortho but by the time she got there the pain and inflammation was down.   Psoriasis: she sees Dr. Kellie Moor, unable to see her notes. She is using topical medication and worse is on her elbows. Also uses a solution on her scalp , she is stable.   Hypothyroidism: taking medication daily without food, no palpitation or dysphagia, denies change in bowel movements or hair loss, we will recheck TSH  History of breast cancer: she had right mastectomy in 1985, had local recurrence in 1986 had recurrence on her skin and had to have chemo and radiation. She never had re-constructive surgery at the time because of the chemo and radiation, she wears a prosthesis. Up to date with mammograms of left breast   She has been working remodeling her new home / farm house and keep scratching herself, has a small cut on left hand right now, due for Tdap we will give it to her today   Senile purpura; both arms. Reassurance given today   Patient Active Problem List   Diagnosis Date Noted  . History of mastectomy, right 11/27/2018  . Internal hemorrhoids 12/01/2016  . Benign neoplasm of transverse colon   . Numerous moles 11/30/2015  . Gastro-esophageal reflux disease without esophagitis 09/14/2015  . Acquired hypothyroidism 09/14/2015  . Menopause 09/14/2015  . Hemorrhage, postmenopausal 09/14/2015  . Vitamin D deficiency 09/14/2015  . History of breast cancer 09/14/2015  . Psoriatic arthritis ([place])  05/22/2014    Past Surgical History:  Procedure Laterality Date  . BREAST EXCISIONAL BIOPSY Left 1990   X 2- neg  . CARDIAC CATHETERIZATION  90's   [facility]; no stent  . CATARACT EXTRACTION W/PHACO Right 02/04/2016   Procedure: CATARACT EXTRACTION PHACO AND INTRAOCULAR LENS PLACEMENT (IOC);  Surgeon: Birder Robson, MD;  Location: [facility] ORS;  Service: Ophthalmology;  Laterality: Right;  [place] 00:44 AP% 19.2 CDE 8.49 fluid pack lot # [id number]  . CATARACT EXTRACTION W/PHACO Left 06/12/2018   Procedure: CATARACT EXTRACTION PHACO AND INTRAOCULAR LENS PLACEMENT (IOC);  Surgeon: Birder Robson, MD;  Location: [facility] ORS;  Service: Ophthalmology;  Laterality: Left;  [place] 00:56.8 AP% 15.8 CDE 8.95 Fluid Pack lot # [id number]  . colonoscopy    . COLONOSCOPY WITH PROPOFOL N/A 07/15/2016   Procedure: COLONOSCOPY WITH PROPOFOL;  Surgeon: [name], MD;  Location: [facility];  Service: Endoscopy;  Laterality: N/A;  . ENDOMETRIAL BIOPSY    . MASTECTOMY Right 1985  . POLYPECTOMY N/A 07/15/2016   Procedure: POLYPECTOMY;  Surgeon: [name], MD;  Location: [facility];  Service: Endoscopy;  Laterality: N/A;    Family History  Problem Relation Age of Onset  . Uterine cancer Mother   . Lung cancer Mother   . Stroke Mother   . Hypertension Mother   . Dementia Mother   . Heart disease Father   . Arrhythmia Father        afib  .  Cervical cancer Sister   . Arrhythmia Sister        afib  . Breast cancer Sister 37  . Breast cancer Paternal Aunt 85    Social History   Tobacco Use  . Smoking status: Former Smoker    Packs/day: 0.25    Years: 10.00    Pack years: 2.50    Types: Cigarettes    Quit date: 11/07/1990    Years since quitting: 29.8  . Smokeless tobacco: Never Used  . Tobacco comment: smoking cessation materials not required  Substance Use Topics  . Alcohol use: No    Alcohol/week: 0.0 standard drinks     Current Outpatient Medications:  .  cholecalciferol (VITAMIN D)  1000 units tablet, Take 1,000 Units by mouth daily. , Disp: , Rfl:  .  Clobetasol Propionate (TEMOVATE) 0.05 % external spray, Apply topically 2 (two) times daily. (Patient taking differently: Apply 1 application topically 2 (two) times daily as needed (for psoriasis). ), Disp: 59 mL, Rfl: 0 .  levothyroxine (SYNTHROID) 75 MCG tablet, Take 1 tablet (75 mcg total) by mouth daily., Disp: 90 tablet, Rfl: 1 .  Multiple Vitamin (MULTIVITAMIN) tablet, Take 1 tablet by mouth daily., Disp: , Rfl:  .  Multiple Vitamins-Minerals (ZINC PO), Take by mouth daily., Disp: , Rfl:  .  NONFORMULARY OR COMPOUNDED ITEM, in the morning and at bedtime., Disp: , Rfl:   Allergies  Allergen Reactions  . Sulfa Antibiotics Rash    I personally reviewed active problem list, medication list, allergies, family history, social history, health maintenance with the patient/caregiver today.   ROS  Constitutional: Negative for fever or weight change.  Respiratory: Negative for cough and shortness of breath.   Cardiovascular: Negative for chest pain or palpitations.  Gastrointestinal: Negative for abdominal pain, no bowel changes.  Musculoskeletal: Negative for gait problem or joint swelling.  Skin: Negative for rash.  Neurological: Negative for dizziness or headache.  No other specific complaints in a complete review of systems (except as listed in HPI above).  Objective  Vitals:   09/16/20 1511  BP: 138/62  Pulse: 95  Resp: 17  Temp: 98 F (36.7 C)  TempSrc: Oral  SpO2: 95%  Weight: 179 lb (81.2 kg)  Height: 5\' 7"  (1.702 m)    Body mass index is 28.04 kg/m.  Physical Exam  Constitutional: Patient appears well-developed and well-nourished. No distress.  HEENT: head atraumatic, normocephalic, pupils equal and reactive to light,  neck supple Cardiovascular: Normal rate, regular rhythm and normal heart sounds.  No murmur heard. No BLE edema. Pulmonary/Chest: Effort normal and breath sounds normal. No  respiratory distress. Abdominal: Soft.  There is no tenderness. Skin: senile purpura, and excoriation of left hand  Psychiatric: Patient has a normal mood and affect. behavior is normal. Judgment and thought content normal.  PHQ2/9: Depression screen [facility] 2/9 09/16/2020 03/03/2020 08/16/2019 07/11/2019 03/14/2019  Decreased Interest 0 0 0 0 0  Down, Depressed, Hopeless 0 0 0 0 0  PHQ - 2 Score 0 0 0 0 0  Altered sleeping - 0 0 0 -  Tired, decreased energy - 0 0 0 -  Change in appetite - 0 0 0 -  Feeling bad or failure about yourself  - 0 0 0 -  Trouble concentrating - 0 0 0 -  Moving slowly or fidgety/restless - 0 0 0 -  Suicidal thoughts - 0 0 - -  PHQ-9 Score - 0 0 0 -  Difficult doing work/chores - -  Not difficult at all Not difficult at all -    phq 9 is negative   Fall Risk: Fall Risk  09/16/2020 03/03/2020 08/16/2019 07/11/2019 03/14/2019  Falls in the past year? 0 0 0 0 0  Number falls in past yr: 0 0 0 0 0  Injury with Fall? 0 0 0 0 0  Risk for fall due to : - - - - -  Risk for fall due to: Comment - - - - -  Follow up - - - - Falls prevention discussed     Functional Status Survey: Is the patient deaf or have difficulty hearing?: No Does the patient have difficulty seeing, even when wearing glasses/contacts?: No Does the patient have difficulty concentrating, remembering, or making decisions?: No Does the patient have difficulty walking or climbing stairs?: No Does the patient have difficulty dressing or bathing?: No Does the patient have difficulty doing errands alone such as visiting a doctor's office or shopping?: No    Assessment & Plan  1. Psoriatic arthritis ([place])   2. Acquired hypothyroidism  - TSH  3. Psoriasis   4. History of mastectomy, right  Up to date mammogram   5. Osteopenia after menopause  Discussed getting bone density done  6. Need for Tdap vaccination  - Tdap vaccine greater than or equal to 7yo IM  7. Senile purpura ([facility])  Stable    8. Skin excoriation  - Tdap vaccine greater than or equal to 7yo IM

## 2020-10-02 IMAGING — MG DIGITAL SCREENING UNILAT LEFT W/ TOMO W/ CAD
4 series · 4 of 12 positions shown · non-contrast
Comparison: Previous exam(s).

CLINICAL DATA: Screening.

EXAM:
DIGITAL SCREENING UNILATERAL LEFT MAMMOGRAM WITH CAD AND TOMO

[L CC synth-2D]
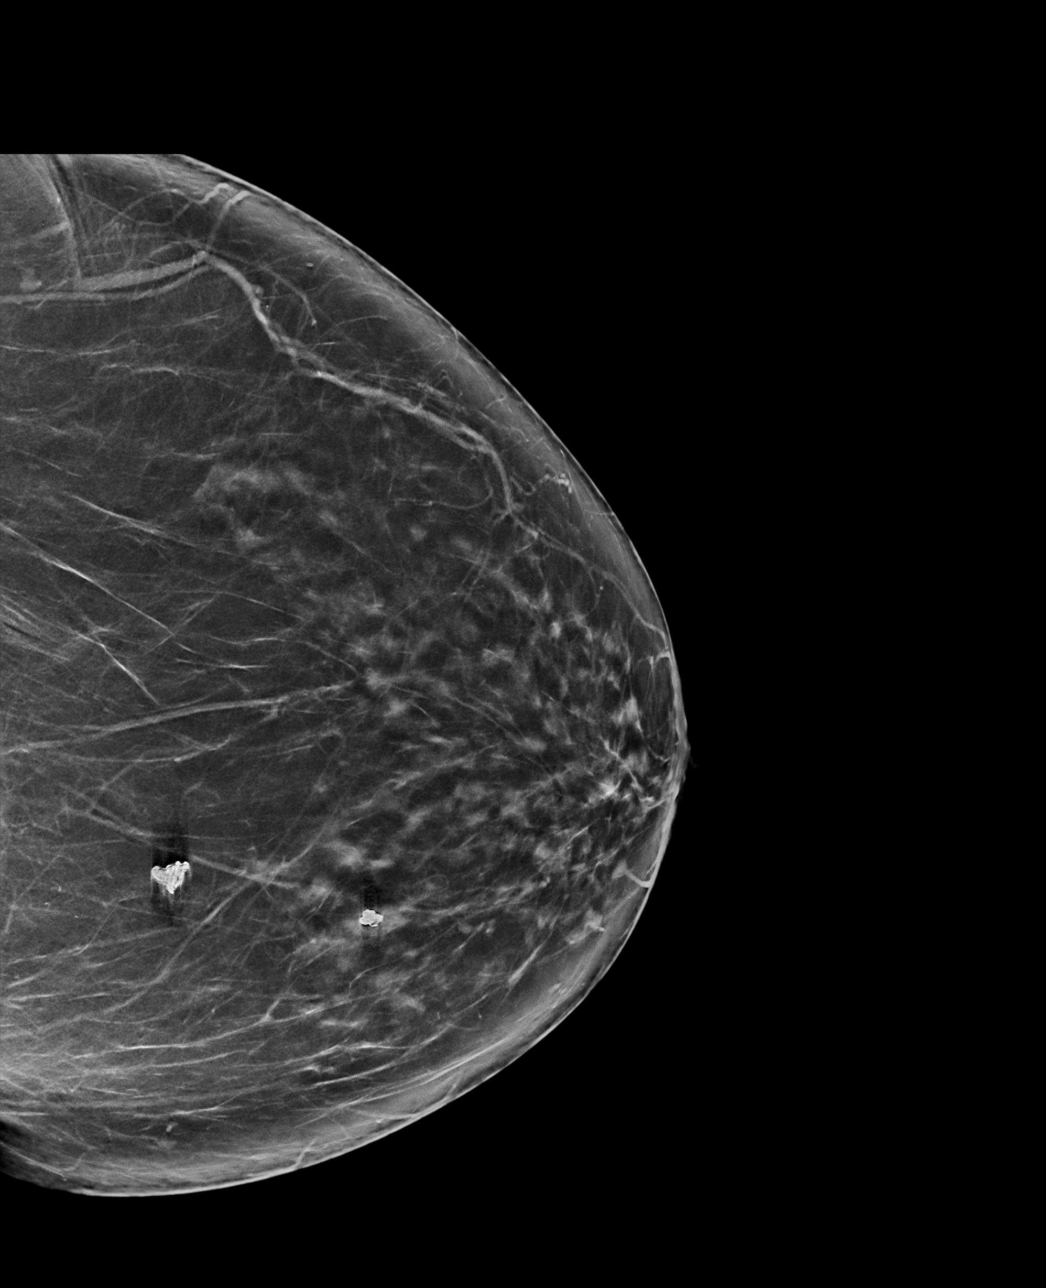

[L MLO synth-2D]
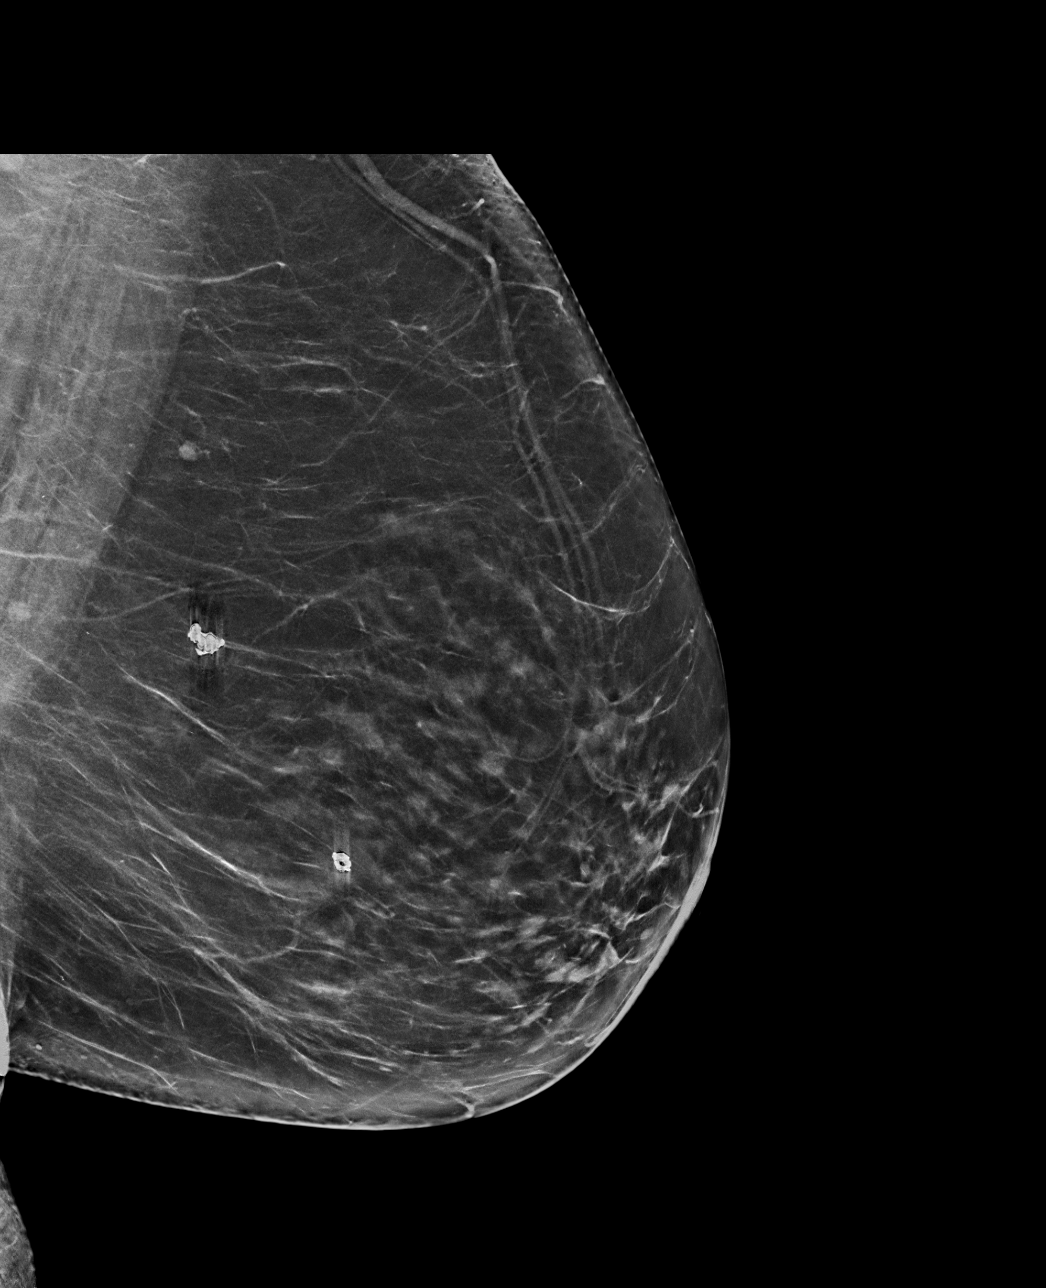

[L MLO tomo · tomo slice 39/78.0]
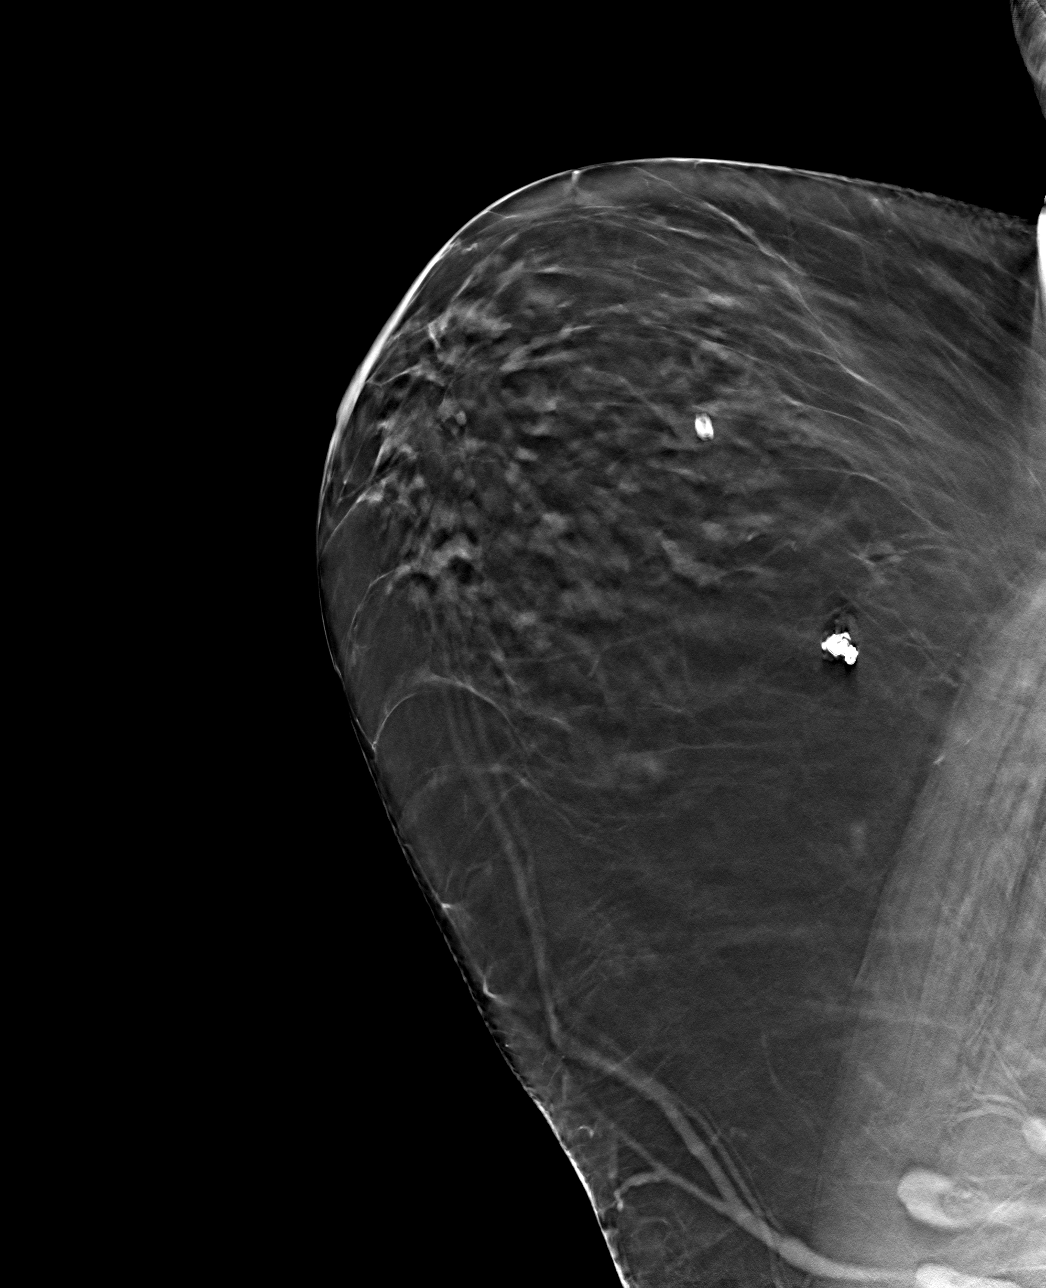

[L CC tomo · tomo slice 37/74.0]
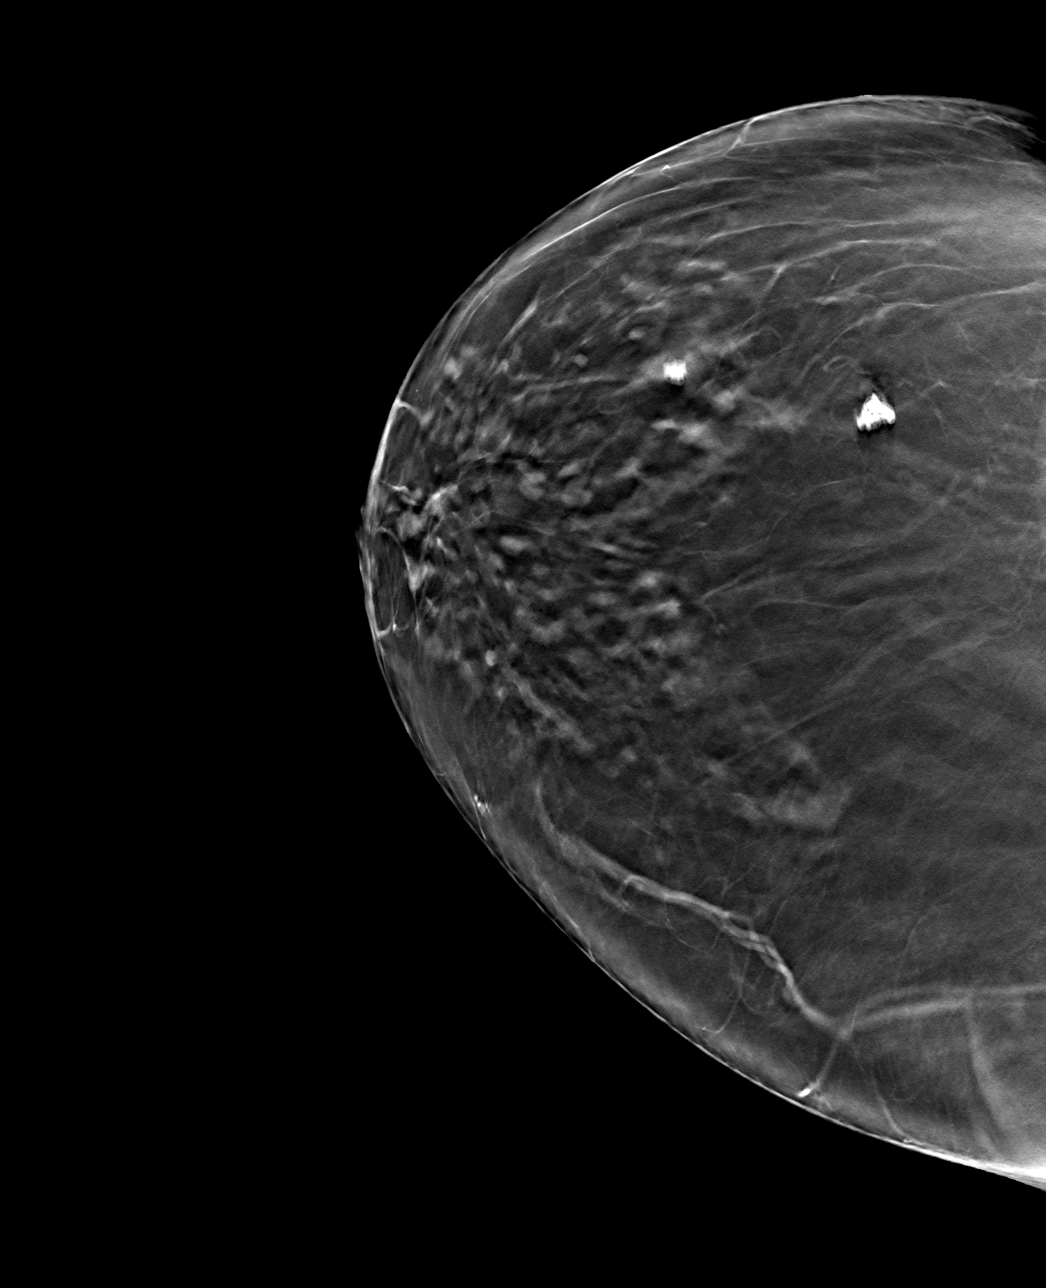

[4 of 12 positions shown; findings below may reference images not displayed]

ACR Breast Density Category b: There are scattered areas of
fibroglandular density.
FINDINGS: The patient has had a right mastectomy. There are no findings
suspicious for malignancy.

Images were processed with CAD.
IMPRESSION: No mammographic evidence of malignancy. A result letter of this
screening mammogram will be mailed directly to the patient.

RECOMMENDATION:
Screening mammogram in one year.  (Code:GY-Q-70Y)

BI-RADS CATEGORY  1: Negative.

## 2020-10-21 DIAGNOSIS — H2000 Unspecified acute and subacute iridocyclitis: Secondary | ICD-10-CM | POA: Diagnosis not present

## 2020-10-22 ENCOUNTER — Ambulatory Visit (INDEPENDENT_AMBULATORY_CARE_PROVIDER_SITE_OTHER): Payer: Medicare PPO

## 2020-10-22 DIAGNOSIS — Z0000 Encounter for general adult medical examination without abnormal findings: Secondary | ICD-10-CM

## 2020-10-22 NOTE — Progress Notes (Signed)
Note addended to include telehealth statement:   Virtual Visit via Telephone Note  I connected with  Nicole Contreras on 10/22/20 at  2:10 PM EST by telephone and verified that I am speaking with the correct person using two identifiers.  Location: Patient: home Provider: [place] Persons participating in the virtual visit: [place]   I discussed the limitations, risks, security and privacy concerns of performing an evaluation and management service by telephone and the availability of in person appointments. The patient expressed understanding and agreed to proceed.  Interactive audio and video telecommunications were attempted between this nurse and patient, however failed, due to patient having technical difficulties OR patient did not have access to video capability.  We continued and completed visit with audio only.  Some vital signs may be absent or patient reported.   Clemetine Marker, LPN

## 2020-10-22 NOTE — Patient Instructions (Signed)
Nicole Contreras , Thank you for taking time to come for your Medicare Wellness Visit. I appreciate your ongoing commitment to your health goals. Please review the following plan we discussed and let me know if I can assist you in the future.   Screening recommendations/referrals: Colonoscopy: done 07/15/16 Mammogram: done 03/17/20 Bone Density: done 08/21/13 Recommended yearly ophthalmology/optometry visit for glaucoma screening and checkup Recommended yearly dental visit for hygiene and checkup  Vaccinations: Influenza vaccine: due Pneumococcal vaccine: done 08/16/19 Tdap vaccine: done 09/16/20 Shingles vaccine: Shingrix discussed. Please contact your pharmacy for coverage information.  Covid-19: declined  Advanced directives: Please bring a copy of your health care power of attorney and living will to the office at your convenience once you have completed that paperwork  Conditions/risks identified: Keep up the great work!  Next appointment: Follow up in one year for your annual wellness visit    Preventive Care 65 Years and Older, Female Preventive care refers to lifestyle choices and visits with your health care provider that can promote health and wellness. What does preventive care include?  A yearly physical exam. This is also called an annual well check.  Dental exams once or twice a year.  Routine eye exams. Ask your health care provider how often you should have your eyes checked.  Personal lifestyle choices, including:  Daily care of your teeth and gums.  Regular physical activity.  Eating a healthy diet.  Avoiding tobacco and drug use.  Limiting alcohol use.  Practicing safe sex.  Taking low-dose aspirin every day.  Taking vitamin and mineral supplements as recommended by your health care provider. What happens during an annual well check? The services and screenings done by your health care provider during your annual well check will depend on your age,  overall health, lifestyle risk factors, and family history of disease. Counseling  Your health care provider may ask you questions about your:  Alcohol use.  Tobacco use.  Drug use.  Emotional well-being.  Home and relationship well-being.  Sexual activity.  Eating habits.  History of falls.  Memory and ability to understand (cognition).  Work and work Statistician.  Reproductive health. Screening  You may have the following tests or measurements:  Height, weight, and BMI.  Blood pressure.  Lipid and cholesterol levels. These may be checked every 5 years, or more frequently if you are over 58 years old.  Skin check.  Lung cancer screening. You may have this screening every year starting at age 71 if you have a 30-pack-year history of smoking and currently smoke or have quit within the past 15 years.  Fecal occult blood test (FOBT) of the stool. You may have this test every year starting at age 72.  Flexible sigmoidoscopy or colonoscopy. You may have a sigmoidoscopy every 5 years or a colonoscopy every 10 years starting at age 12.  Hepatitis C blood test.  Hepatitis B blood test.  Sexually transmitted disease (STD) testing.  Diabetes screening. This is done by checking your blood sugar (glucose) after you have not eaten for a while (fasting). You may have this done every 1-3 years.  Bone density scan. This is done to screen for osteoporosis. You may have this done starting at age 11.  Mammogram. This may be done every 1-2 years. Talk to your health care provider about how often you should have regular mammograms. Talk with your health care provider about your test results, treatment options, and if necessary, the need for more tests. Vaccines  Your health care provider may recommend certain vaccines, such as:  Influenza vaccine. This is recommended every year.  Tetanus, diphtheria, and acellular pertussis (Tdap, Td) vaccine. You may need a Td booster every 10  years.  Zoster vaccine. You may need this after age 65.  Pneumococcal 13-valent conjugate (PCV13) vaccine. One dose is recommended after age 14.  Pneumococcal polysaccharide (PPSV23) vaccine. One dose is recommended after age 45. Talk to your health care provider about which screenings and vaccines you need and how often you need them. This information is not intended to replace advice given to you by your health care provider. Make sure you discuss any questions you have with your health care provider. Document Released: 11/20/2015 Document Revised: 07/13/2016 Document Reviewed: 08/25/2015 Elsevier Interactive Patient Education  2017 [place] Prevention in the Home Falls can cause injuries. They can happen to people of all ages. There are many things you can do to make your home safe and to help prevent falls. What can I do on the outside of my home?  Regularly fix the edges of walkways and driveways and fix any cracks.  Remove anything that might make you trip as you walk through a door, such as a raised step or threshold.  Trim any bushes or trees on the path to your home.  Use bright outdoor lighting.  Clear any walking paths of anything that might make someone trip, such as rocks or tools.  Regularly check to see if handrails are loose or broken. Make sure that both sides of any steps have handrails.  Any raised decks and porches should have guardrails on the edges.  Have any leaves, snow, or ice cleared regularly.  Use sand or salt on walking paths during winter.  Clean up any spills in your garage right away. This includes oil or grease spills. What can I do in the bathroom?  Use night lights.  Install grab bars by the toilet and in the tub and shower. Do not use towel bars as grab bars.  Use non-skid mats or decals in the tub or shower.  If you need to sit down in the shower, use a plastic, non-slip stool.  Keep the floor dry. Clean up any water that  spills on the floor as soon as it happens.  Remove soap buildup in the tub or shower regularly.  Attach bath mats securely with double-sided non-slip rug tape.  Do not have throw rugs and other things on the floor that can make you trip. What can I do in the bedroom?  Use night lights.  Make sure that you have a light by your bed that is easy to reach.  Do not use any sheets or blankets that are too big for your bed. They should not hang down onto the floor.  Have a firm chair that has side arms. You can use this for support while you get dressed.  Do not have throw rugs and other things on the floor that can make you trip. What can I do in the kitchen?  Clean up any spills right away.  Avoid walking on wet floors.  Keep items that you use a lot in easy-to-reach places.  If you need to reach something above you, use a strong step stool that has a grab bar.  Keep electrical cords out of the way.  Do not use floor polish or wax that makes floors slippery. If you must use wax, use non-skid floor wax.  Do  not have throw rugs and other things on the floor that can make you trip. What can I do with my stairs?  Do not leave any items on the stairs.  Make sure that there are handrails on both sides of the stairs and use them. Fix handrails that are broken or loose. Make sure that handrails are as long as the stairways.  Check any carpeting to make sure that it is firmly attached to the stairs. Fix any carpet that is loose or worn.  Avoid having throw rugs at the top or bottom of the stairs. If you do have throw rugs, attach them to the floor with carpet tape.  Make sure that you have a light switch at the top of the stairs and the bottom of the stairs. If you do not have them, ask someone to add them for you. What else can I do to help prevent falls?  Wear shoes that:  Do not have high heels.  Have rubber bottoms.  Are comfortable and fit you well.  Are closed at the  toe. Do not wear sandals.  If you use a stepladder:  Make sure that it is fully opened. Do not climb a closed stepladder.  Make sure that both sides of the stepladder are locked into place.  Ask someone to hold it for you, if possible.  Clearly mark and make sure that you can see:  Any grab bars or handrails.  First and last steps.  Where the edge of each step is.  Use tools that help you move around (mobility aids) if they are needed. These include:  Canes.  Walkers.  Scooters.  Crutches.  Turn on the lights when you go into a dark area. Replace any light bulbs as soon as they burn out.  Set up your furniture so you have a clear path. Avoid moving your furniture around.  If any of your floors are uneven, fix them.  If there are any pets around you, be aware of where they are.  Review your medicines with your doctor. Some medicines can make you feel dizzy. This can increase your chance of falling. Ask your doctor what other things that you can do to help prevent falls. This information is not intended to replace advice given to you by your health care provider. Make sure you discuss any questions you have with your health care provider. Document Released: 08/20/2009 Document Revised: 03/31/2016 Document Reviewed: 11/28/2014 Elsevier Interactive Patient Education  2017 [employer].

## 2020-10-22 NOTE — Progress Notes (Signed)
Subjective:   Nicole Contreras is a 74 y.o. female who presents for Medicare Annual (Subsequent) preventive examination.  Review of Systems     Cardiac Risk Factors include: advanced age (>70men, >64 women)     Objective:    There were no vitals filed for this visit. There is no height or weight on file to calculate BMI.  Advanced Directives 10/22/2020 03/14/2019 06/12/2018 03/09/2018 11/12/2017 08/08/2017 12/01/2016  Does Patient Have a Medical Advance Directive? No No No No No No No  Would patient like information on creating a medical advance directive? No - Patient declined Yes (MAU/Ambulatory/Procedural Areas - Information given) No - Patient declined Yes (MAU/Ambulatory/Procedural Areas - Information given) - - -    Current Medications (verified) Outpatient Encounter Medications as of 10/22/2020  Medication Sig  . cholecalciferol (VITAMIN D) 1000 units tablet Take 1,000 Units by mouth daily.   . Clobetasol Propionate (TEMOVATE) 0.05 % external spray Apply topically 2 (two) times daily. (Patient taking differently: Apply 1 application topically 2 (two) times daily as needed (for psoriasis).)  . levothyroxine (SYNTHROID) 75 MCG tablet Take 1 tablet (75 mcg total) by mouth daily.  . Multiple Vitamin (MULTIVITAMIN) tablet Take 1 tablet by mouth daily.  . NONFORMULARY OR COMPOUNDED ITEM in the morning and at bedtime.  Marland Kitchen zinc gluconate 50 MG tablet Take 50 mg by mouth daily.  . [DISCONTINUED] Multiple Vitamins-Minerals (ZINC PO) Take by mouth daily.   No facility-administered encounter medications on file as of 10/22/2020.    Allergies (verified) Sulfa antibiotics   History: Past Medical History:  Diagnosis Date  . Arthritis   . Atypical chest pain    a. 03/2014 ETT: Ex time 4:10, Max HR 160 bpm, no acute st/t changes.  . Breast cancer ([place]) 1986   s/p radiation.  . Bright's disease    Hx  . Diastolic dysfunction    a. 02/2014 Echo: EF 55-60%, no rwma, Gr1 DD.  . Dizziness and  giddiness   . Fecal smearing   . GERD (gastroesophageal reflux disease)   . Heart murmur    as child  . Insomnia, unspecified   . Obesity, unspecified   . Personal history of chemotherapy   . Personal history of radiation therapy 1986   right breast ca  . PONV (postoperative nausea and vomiting)    after cataract procedure  . Postmenopausal bleeding   . Psoriasis   . 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 Reflux esophagitis   . Right cataract    a. 01/2016 s/p cataract surgery.  Marland Kitchen Spasm of muscle   . Unspecified hypothyroidism   . Unspecified urinary incontinence   . Unspecified vitamin D deficiency    Past Surgical History:  Procedure Laterality Date  . BREAST EXCISIONAL BIOPSY Left 1990   X 2- neg  . CARDIAC CATHETERIZATION  90's   [facility]; no stent  . CATARACT EXTRACTION W/PHACO Right 02/04/2016   Procedure: CATARACT EXTRACTION PHACO AND INTRAOCULAR LENS PLACEMENT (IOC);  Surgeon: Birder Robson, MD;  Location: [facility];  Service: Ophthalmology;  Laterality: Right;  [place] 00:44 AP% 19.2 CDE 8.49 fluid pack lot # [id number]  . CATARACT EXTRACTION W/PHACO Left 06/12/2018   Procedure: CATARACT EXTRACTION PHACO AND INTRAOCULAR LENS PLACEMENT (IOC);  Surgeon: Birder Robson, MD;  Location: [facility];  Service: Ophthalmology;  Laterality: Left;  [place] 00:56.8 AP% 15.8 CDE 8.95 Fluid Pack lot # [id number]  . colonoscopy    . COLONOSCOPY WITH PROPOFOL N/A 07/15/2016   Procedure: COLONOSCOPY WITH  PROPOFOL;  Surgeon: [name], MD;  Location: [place];  Service: Endoscopy;  Laterality: N/A;  . ENDOMETRIAL BIOPSY    . MASTECTOMY Right 1985  . POLYPECTOMY N/A 07/15/2016   Procedure: POLYPECTOMY;  Surgeon: [name], MD;  Location: [place];  Service: Endoscopy;  Laterality: N/A;   Family History  Problem Relation Age of Onset  . Uterine cancer Mother   . Lung cancer Mother   . Stroke Mother   . Hypertension Mother   . Dementia Mother   . Heart disease Father   .  Arrhythmia Father        afib  . Cervical cancer Sister   . Arrhythmia Sister        afib  . Breast cancer Sister 47  . Breast cancer Paternal Aunt 43   Social History   Socioeconomic History  . Marital status: Married    Spouse name: Quillian Quince  . Number of children: 2  . Years of education: Not on file  . Highest education level: Associate degree: occupational, Hotel manager, or vocational program  Occupational History  . Occupation: Retired  Tobacco Use  . Smoking status: Former Smoker    Packs/day: 0.25    Years: 10.00    Pack years: 2.50    Types: Cigarettes    Quit date: 11/07/1990    Years since quitting: 29.9  . Smokeless tobacco: Never Used  . Tobacco comment: smoking cessation materials not required  Vaping Use  . Vaping Use: Never used  Substance and Sexual Activity  . Alcohol use: No    Alcohol/week: 0.0 standard drinks  . Drug use: No  . Sexual activity: Not Currently  Other Topics Concern  . Not on file  Social History Narrative  . Not on file   Social Determinants of Health   Financial Resource Strain: Low Risk   . Difficulty of Paying Living Expenses: Not hard at all  Food Insecurity: No Food Insecurity  . Worried About Charity fundraiser in the Last Year: Never true  . Ran Out of Food in the Last Year: Never true  Transportation Needs: No Transportation Needs  . Lack of Transportation (Medical): No  . Lack of Transportation (Non-Medical): No  Physical Activity: Sufficiently Active  . Days of Exercise per Week: 7 days  . Minutes of Exercise per Session: 60 min  Stress: No Stress Concern Present  . Feeling of Stress : Not at all  Social Connections: Socially Integrated  . Frequency of Communication with Friends and Family: More than three times a week  . Frequency of Social Gatherings with Friends and Family: More than three times a week  . Attends Religious Services: More than 4 times per year  . Active Member of Clubs or Organizations: Yes  .  Attends Archivist Meetings: More than 4 times per year  . Marital Status: Married    Tobacco Counseling Counseling given: Not Answered Comment: smoking cessation materials not required   Clinical Intake:  Pre-visit preparation completed: Yes  Pain : No/denies pain     Nutritional Risks: None Diabetes: No  How often do you need to have someone help you when you read instructions, pamphlets, or other written materials from your doctor or pharmacy?: 1 - Never    Interpreter Needed?: No  Information entered by :: Clemetine Marker LPN   Activities of Daily Living In your present state of health, do you have any difficulty performing the following activities: 10/22/2020 09/16/2020  Hearing?  N N  Comment declines hearing aids -  Vision? N N  Difficulty concentrating or making decisions? N N  Walking or climbing stairs? N N  Dressing or bathing? N N  Doing errands, shopping? N N  Preparing Food and eating ? N -  Using the Toilet? N -  In the past six months, have you accidently leaked urine? N -  Do you have problems with loss of bowel control? N -  Managing your Medications? N -  Managing your Finances? N -  Housekeeping or managing your Housekeeping? N -  Some recent data might be hidden    Patient Care Team: Steele Sizer, MD as PCP - General (Family Medicine) Emmaline Kluver., MD as Consulting Physician (Rheumatology) Oneta Rack, MD as Consulting Physician (Dermatology) Junius Roads, Legrand Como, MD as Anesthesiologist (Orthodontics)  Indicate any recent Medical Services you may have received from other than [facility] providers in the past year (date may be approximate).     Assessment:   This is a routine wellness examination for Donyell.  Hearing/Vision screen  Hearing Screening   125Hz  250Hz  500Hz  1000Hz  2000Hz  3000Hz  4000Hz  6000Hz  8000Hz   Right ear:           Left ear:           Comments: Pt denies hearing difficulty   Vision Screening Comments:  [facility] screen[facility]   Dietary issues and exercise activities discussed: Current Exercise Habits: Home exercise routine, Type of exercise: Other - see comments (house remodel), Time (Minutes): 60, Frequency (Times/Week): 7, Weekly Exercise (Minutes/Week): 420, Intensity: Mild, Exercise limited by: None identified  Goals    . DIET - INCREASE WATER INTAKE     Recommend to drink at least 6-8 8oz glasses of water per day.      Depression Screen PHQ 2/9 Scores 10/22/2020 09/16/2020 03/03/2020 08/16/2019 07/11/2019 03/14/2019 01/04/2019  PHQ - 2 Score 0 0 0 0 0 0 0  PHQ- 9 Score - - 0 0 0 - 0    Fall Risk Fall Risk  10/22/2020 09/16/2020 03/03/2020 08/16/2019 07/11/2019  Falls in the past year? 0 0 0 0 0  Number falls in past yr: 0 0 0 0 0  Injury with Fall? 0 0 0 0 0  Risk for fall due to : No Fall Risks - - - -  Risk for fall due to: Comment - - - - -  Follow up Falls prevention discussed - - - -    FALL RISK PREVENTION PERTAINING TO THE HOME:  Any stairs in or around the home? Yes  If so, are there any without handrails? No  Home free of loose throw rugs in walkways, pet beds, electrical cords, etc? Yes  Adequate lighting in your home to reduce risk of falls? Yes   ASSISTIVE DEVICES UTILIZED TO PREVENT FALLS:  Life alert? No  Use of a cane, walker or w/c? No  Grab bars in the bathroom? Yes  Shower chair or bench in shower? Yes  Elevated toilet seat or a handicapped toilet? Yes   TIMED UP AND GO:  Was the test performed? No . Telephonic visit.   Cognitive Function: Normal cognitive status assessed by direct observation by this Nurse Health Advisor. No abnormalities found.       6CIT Screen 03/14/2019 03/09/2018  What Year? 0 points 0 points  What month? 0 points 0 points  What time? 0 points 3 points  Count back from  20 0 points 0 points  Months in reverse 0 points 0 points  Repeat phrase 0 points 0 points  Total Score 0 3     Immunizations Immunization History  Administered Date(s) Administered  . Fluad Quad(high Dose 65+) 08/16/2019  . Influenza, High Dose Seasonal PF 08/08/2017  . Influenza, Seasonal, Injecte, Preservative Fre 08/16/2012, 08/07/2013  . Influenza,inj,Quad PF,6+ Mos 08/04/2014  . Influenza,inj,quad, With Preservative 08/31/2018  . Influenza-Unspecified 08/24/2015, 08/18/2016, 08/23/2018  . Pneumococcal Conjugate-13 09/14/2015  . Pneumococcal Polysaccharide-23 08/19/2013, 08/16/2019  . Tdap 10/05/2010, 09/16/2020  . Zoster 04/08/2011    TDAP status: Up to date  Flu Vaccine status: Due, Education has been provided regarding the importance of this vaccine. Advised may receive this vaccine at local pharmacy or Health Dept. Aware to provide a copy of the vaccination record if obtained from local pharmacy or Health Dept. Verbalized acceptance and understanding.  Pneumococcal vaccine status: Up to date  Covid-19 vaccine status: Declined, Education has been provided regarding the importance of this vaccine but patient still declined. Advised may receive this vaccine at local pharmacy or Health Dept.or vaccine clinic. Aware to provide a copy of the vaccination record if obtained from local pharmacy or Health Dept. Verbalized acceptance and understanding.  Qualifies for Shingles Vaccine? Yes   Zostavax completed Yes   Shingrix Completed?: No.    Education has been provided regarding the importance of this vaccine. Patient has been advised to call insurance company to determine out of pocket expense if they have not yet received this vaccine. Advised may also receive vaccine at local pharmacy or Health Dept. Verbalized acceptance and understanding.  Screening Tests Health Maintenance  Topic Date Due  . COVID-19 Vaccine (1) 11/07/2020 (Originally 08/27/1958)  . INFLUENZA VACCINE  02/04/2021 (Originally 06/07/2020)  . MAMMOGRAM  03/17/2021  . COLONOSCOPY  07/15/2026  . TETANUS/TDAP  09/16/2030   . DEXA SCAN  Completed  . Hepatitis C Screening  Completed  . PNA vac Low Risk Adult  Completed    Health Maintenance  There are no preventive care reminders to display for this patient.  Colorectal cancer screening: Type of screening: Colonoscopy. Completed 07/15/16. Repeat every 10 years  Mammogram status: Completed 03/17/20. Repeat every year . Ordered 05/08/20  Bone Density status: Completed 2014. Results reflect: Bone density results: OSTEOPENIA. Repeat every 2 years. Ordered 05/08/20  Lung Cancer Screening: (Low Dose CT Chest recommended if Age 67-80 years, 30 pack-year currently smoking OR have quit w/in 15years.) does not qualify.   Additional Screening:  Hepatitis C Screening: does qualify; Completed 03/20/13  Vision Screening: Recommended annual ophthalmology exams for early detection of glaucoma and other disorders of the eye. Is the patient up to date with their annual eye exam?  Yes  Who is the provider or what is the name of the office in which the patient attends annual eye exams? Dr. Matilde Sprang  Dental Screening: Recommended annual dental exams for proper oral hygiene  Community Resource Referral / Chronic Care Management: CRR required this visit?  No   CCM required this visit?  No      Plan:     I have personally reviewed and noted the following in the patient's chart:   . Medical and social history . Use of alcohol, tobacco or illicit drugs  . Current medications and supplements . Functional ability and status . Nutritional status . Physical activity . Advanced directives . List of other physicians . Hospitalizations, surgeries, and ER visits in previous 12 months . Vitals .  Screenings to include cognitive, depression, and falls . Referrals and appointments  In addition, I have reviewed and discussed with patient certain preventive protocols, quality metrics, and best practice recommendations. A written personalized care plan for preventive services as well  as general preventive health recommendations were provided to patient.     Clemetine Marker, LPN   [id number]   Nurse Notes: none

## 2020-11-09 DIAGNOSIS — H35353 Cystoid macular degeneration, bilateral: Secondary | ICD-10-CM | POA: Diagnosis not present

## 2020-11-09 DIAGNOSIS — H2000 Unspecified acute and subacute iridocyclitis: Secondary | ICD-10-CM | POA: Diagnosis not present

## 2020-11-26 DIAGNOSIS — H900 Conductive hearing loss, bilateral: Secondary | ICD-10-CM | POA: Diagnosis not present

## 2020-11-26 DIAGNOSIS — H6123 Impacted cerumen, bilateral: Secondary | ICD-10-CM | POA: Diagnosis not present

## 2020-12-07 DIAGNOSIS — H2000 Unspecified acute and subacute iridocyclitis: Secondary | ICD-10-CM | POA: Diagnosis not present

## 2020-12-07 DIAGNOSIS — H35351 Cystoid macular degeneration, right eye: Secondary | ICD-10-CM | POA: Diagnosis not present

## 2021-01-11 DIAGNOSIS — H2000 Unspecified acute and subacute iridocyclitis: Secondary | ICD-10-CM | POA: Diagnosis not present

## 2021-02-15 DIAGNOSIS — H2000 Unspecified acute and subacute iridocyclitis: Secondary | ICD-10-CM | POA: Diagnosis not present

## 2021-02-15 DIAGNOSIS — H35353 Cystoid macular degeneration, bilateral: Secondary | ICD-10-CM | POA: Diagnosis not present

## 2021-03-30 ENCOUNTER — Other Ambulatory Visit: Payer: Self-pay | Admitting: Family Medicine

## 2021-03-30 DIAGNOSIS — Z1231 Encounter for screening mammogram for malignant neoplasm of breast: Secondary | ICD-10-CM

## 2021-04-06 DIAGNOSIS — H35353 Cystoid macular degeneration, bilateral: Secondary | ICD-10-CM | POA: Diagnosis not present

## 2021-04-06 DIAGNOSIS — H2000 Unspecified acute and subacute iridocyclitis: Secondary | ICD-10-CM | POA: Diagnosis not present

## 2021-04-08 ENCOUNTER — Ambulatory Visit
Admission: RE | Admit: 2021-04-08 | Discharge: 2021-04-08 | Disposition: A | Discharge status: Home / Self Care | Payer: Medicare PPO | Source: Ambulatory Visit | Attending: Family Medicine | Admitting: Family Medicine

## 2021-04-08 ENCOUNTER — Other Ambulatory Visit: Payer: Self-pay

## 2021-04-08 DIAGNOSIS — Z1231 Encounter for screening mammogram for malignant neoplasm of breast: Secondary | ICD-10-CM | POA: Diagnosis not present

## 2021-04-14 ENCOUNTER — Ambulatory Visit: Payer: Medicare PPO | Admitting: Family Medicine

## 2021-04-14 ENCOUNTER — Ambulatory Visit (INDEPENDENT_AMBULATORY_CARE_PROVIDER_SITE_OTHER): Payer: Medicare PPO

## 2021-04-14 ENCOUNTER — Other Ambulatory Visit: Payer: Self-pay

## 2021-04-14 DIAGNOSIS — M25511 Pain in right shoulder: Secondary | ICD-10-CM

## 2021-04-14 MED ORDER — NABUMETONE 500 MG PO TABS: 500.0000 mg | ORAL_TABLET | Freq: Two times a day (BID) | ORAL | 3 refills | Status: DC | PRN

## 2021-04-14 NOTE — Progress Notes (Signed)
Office Visit Note   Patient: Nicole Contreras           Date of Birth: 03-06-1946           MRN: [id number] Visit Date: 04/14/2021 Requested by: Steele Sizer, [address] [place],  [place] [id number] PCP: Steele Sizer, MD  Subjective: Chief Complaint  Patient presents with  . Right Shoulder - Pain    Right shoulder pain, radiating down the anterior upper arm x 2-3 months. Unsure of any specific injury, but did do much lifting with moving house a couple months ago. S/p mastectomy on the right, so that the muscles on that side are weaker. Pain with raising that arm up, due to pain- some days are worse than others    HPI: 75yo F presenting to clinic with 2-3 months of superior right shoulder pain. Patient states she has a remote history of breast cancer, and states that the surgery left her with chronic weakness in the right shoulder. For the past 2-3 months, she has noticed a worsening pain over her right shoulder, which is limiting her ability to dress herself in the morning. She says that she recently packed up a house before her pain started, and thinks this may have contributed, though there was no overt moment of trauma. Pain radiates down to her mid humerus, but there is no weakness/tingling in the hand. Pain does not wake her from sleep, and is not accompanied by systemic symptoms. She has a history of psoriatic arthritis. She has tried volatren gel, with minimal improvement, as well as turmeric. She says two Aleve help to keep her functional throughout the day.               ROS:   All other systems were reviewed and are negative.  Objective: Vital Signs: There were no vitals taken for this visit.  Physical Exam:  General:  Alert and oriented, in no acute distress. Pulm:  Breathing unlabored. Psy:  Normal mood, congruent affect. Skin:  Right shoulder with well healed surgical incisions as well as redundant tissue in the anterior axilla. Otherwise, no  bruising, no rashes, overlying skin intact.  Right Shoulder Exam:  Inspection: Trace reduction in deltoid bulk compared to left shoulder. Palpation: Endorses tenderness to palpation over the [facility] Joint, and some mild tenderness in the subacromial area. No tenderness over biceps.  Range of motion: Full range of motion in forward flexion, abduction and extension.  Full External rotation to 100 degrees. Apley scratch symmetrical at approximately level of T6 vertebrae.  Rotator cuff testing:  Full strength and no pain with empty can (supraspinatus).  External and internal rotation with full strength and no pain.  AC joint testing: Endorses significant AC tenderness to palpation, and pain with scarf test. Pain with active compression test.  Labral/Biceps testing: O'Brien's/speeds with full strength and no pain. Biceps load II: Negative Crank test: Negative  Impingement testing: No pain with Neer's or Hawkins  Strength testing:  5 out of 5 strength with shoulder abduction (C5), wrist extension (C6), wrist flexion (C7), grip strength (C8), and finger abduction (T1).  Sensation: Intact to light touch throughout bilateral upper extremities.   Brisk distal capillary refill.   Imaging: XR Shoulder Right  Result Date: 04/14/2021 X-Rays show moderate DJD at the [facility] joint.  Glenohumeral joint looks good.  No sign of neoplasm.   Assessment & Plan: 75yo F presenting to clinic with 2-3 months of atraumatic right shoulder pain, with maximal  tenderness over the [facility] Joint, and Xray confirms DJD in this area.  - Will start course of treatment with Relafen to calm inflammation within joint. - If no improvement, would consider [facility] Joint injection - Patient expresses understanding, and has no further questions or concerns today.      Procedures: No procedures performed        PMFS History: Patient Active Problem List   Diagnosis Date Noted  . History of mastectomy, right 11/27/2018  .  Internal hemorrhoids 12/01/2016  . Benign neoplasm of transverse colon   . Numerous moles 11/30/2015  . Gastro-esophageal reflux disease without esophagitis 09/14/2015  . Acquired hypothyroidism 09/14/2015  . Menopause 09/14/2015  . Hemorrhage, postmenopausal 09/14/2015  . Vitamin D deficiency 09/14/2015  . History of breast cancer 09/14/2015  . Psoriatic arthritis ([place]) 05/22/2014   Past Medical History:  Diagnosis Date  . Arthritis   . Atypical chest pain    a. 03/2014 ETT: Ex time 4:10, Max HR 160 bpm, no acute st/t changes.  . Breast cancer ([place]) 1986   s/p radiation.  . Bright's disease    Hx  . Diastolic dysfunction    a. 02/2014 Echo: EF 55-60%, no rwma, Gr1 DD.  . Dizziness and giddiness   . Fecal smearing   . GERD (gastroesophageal reflux disease)   . Heart murmur    as child  . Insomnia, unspecified   . Obesity, unspecified   . Personal history of chemotherapy   . Personal history of radiation therapy 1986   right breast ca  . PONV (postoperative nausea and vomiting)    after cataract procedure  . Postmenopausal bleeding   . Psoriasis   . Psoriatic arthropathy ([place])   . Reflux esophagitis   . Right cataract    a. 01/2016 s/p cataract surgery.  Marland Kitchen Spasm of muscle   . Unspecified hypothyroidism   . Unspecified urinary incontinence   . Unspecified vitamin D deficiency     Family History  Problem Relation Age of Onset  . Uterine cancer Mother   . Lung cancer Mother   . Stroke Mother   . Hypertension Mother   . Dementia Mother   . Heart disease Father   . Arrhythmia Father        afib  . Cervical cancer Sister   . Arrhythmia Sister        afib  . Breast cancer Sister 33  . Breast cancer Paternal Aunt 30    Past Surgical History:  Procedure Laterality Date  . BREAST EXCISIONAL BIOPSY Left 1990   X 2- neg  . CARDIAC CATHETERIZATION  90's   [facility]; no stent  . CATARACT EXTRACTION W/PHACO Right 02/04/2016   Procedure: CATARACT EXTRACTION PHACO AND  INTRAOCULAR LENS PLACEMENT (IOC);  Surgeon: Birder Robson, MD;  Location: [facility]S;  Service: Ophthalmology;  Laterality: Right;  [place] 00:44 AP% 19.2 CDE 8.49 fluid pack lot # [id number]  . CATARACT EXTRACTION W/PHACO Left 06/12/2018   Procedure: CATARACT EXTRACTION PHACO AND INTRAOCULAR LENS PLACEMENT (IOC);  Surgeon: Birder Robson, MD;  Location: [facility] ORS;  Service: Ophthalmology;  Laterality: Left;  [place] 00:56.8 AP% 15.8 CDE 8.95 Fluid Pack lot # [id number] H  . colonoscopy    . COLONOSCOPY WITH PROPOFOL N/A 07/15/2016   Procedure: COLONOSCOPY WITH PROPOFOL;  Surgeon: [name], MD;  Location: [place];  Service: Endoscopy;  Laterality: N/A;  . ENDOMETRIAL BIOPSY    . MASTECTOMY Right 1985  . POLYPECTOMY N/A 07/15/2016   Procedure: POLYPECTOMY;  Surgeon: [name], MD;  Location: [place];  Service: Endoscopy;  Laterality: N/A;   Social History   Occupational History  . Occupation: Retired  Tobacco Use  . Smoking status: Former Smoker    Packs/day: 0.25    Years: 10.00    Pack years: 2.50    Types: Cigarettes    Quit date: 11/07/1990    Years since quitting: 30.4  . Smokeless tobacco: Never Used  . Tobacco comment: smoking cessation materials not required  Vaping Use  . Vaping Use: Never used  Substance and Sexual Activity  . Alcohol use: No    Alcohol/week: 0.0 standard drinks  . Drug use: No  . Sexual activity: Not Currently

## 2021-04-14 NOTE — Progress Notes (Signed)
I saw and examined the patient with Dr. Elouise Munroe and agree with assessment and plan as outlined.    Right shoulder pain at [facility] joint.  Will try nabumetone.  Prednisone if still no relief, followed by injection.

## 2021-05-14 DIAGNOSIS — D2261 Melanocytic nevi of right upper limb, including shoulder: Secondary | ICD-10-CM | POA: Diagnosis not present

## 2021-05-14 DIAGNOSIS — D2262 Melanocytic nevi of left upper limb, including shoulder: Secondary | ICD-10-CM | POA: Diagnosis not present

## 2021-05-14 DIAGNOSIS — L400 Psoriasis vulgaris: Secondary | ICD-10-CM | POA: Diagnosis not present

## 2021-05-14 DIAGNOSIS — D2271 Melanocytic nevi of right lower limb, including hip: Secondary | ICD-10-CM | POA: Diagnosis not present

## 2021-05-14 DIAGNOSIS — L821 Other seborrheic keratosis: Secondary | ICD-10-CM | POA: Diagnosis not present

## 2021-05-14 DIAGNOSIS — D225 Melanocytic nevi of trunk: Secondary | ICD-10-CM | POA: Diagnosis not present

## 2021-05-27 DIAGNOSIS — H902 Conductive hearing loss, unspecified: Secondary | ICD-10-CM | POA: Diagnosis not present

## 2021-05-27 DIAGNOSIS — H6123 Impacted cerumen, bilateral: Secondary | ICD-10-CM | POA: Diagnosis not present

## 2021-06-07 DIAGNOSIS — H35353 Cystoid macular degeneration, bilateral: Secondary | ICD-10-CM | POA: Diagnosis not present

## 2021-06-07 DIAGNOSIS — H2000 Unspecified acute and subacute iridocyclitis: Secondary | ICD-10-CM | POA: Diagnosis not present

## 2021-09-06 DIAGNOSIS — H2000 Unspecified acute and subacute iridocyclitis: Secondary | ICD-10-CM | POA: Diagnosis not present

## 2021-09-06 DIAGNOSIS — H35352 Cystoid macular degeneration, left eye: Secondary | ICD-10-CM | POA: Diagnosis not present

## 2021-09-14 ENCOUNTER — Other Ambulatory Visit: Payer: Self-pay | Admitting: Family Medicine

## 2021-09-14 DIAGNOSIS — E039 Hypothyroidism, unspecified: Secondary | ICD-10-CM

## 2021-09-16 ENCOUNTER — Other Ambulatory Visit: Payer: Self-pay

## 2021-09-16 ENCOUNTER — Ambulatory Visit: Payer: Self-pay

## 2021-09-16 ENCOUNTER — Encounter: Payer: Self-pay | Admitting: Physician Assistant

## 2021-09-16 ENCOUNTER — Ambulatory Visit: Payer: Medicare PPO | Admitting: Physician Assistant

## 2021-09-16 DIAGNOSIS — M25561 Pain in right knee: Secondary | ICD-10-CM | POA: Diagnosis not present

## 2021-09-16 MED ORDER — LIDOCAINE HCL 1 % IJ SOLN
2.0000 mL | INTRAMUSCULAR | Status: AC | PRN
  Administered 2021-09-16: 2 mL

## 2021-09-16 MED ORDER — METHYLPREDNISOLONE ACETATE 40 MG/ML IJ SUSP
40.0000 mg | INTRAMUSCULAR | Status: AC | PRN
  Administered 2021-09-16: 40 mg via INTRA_ARTICULAR

## 2021-09-16 NOTE — Progress Notes (Signed)
Office Visit Note   Patient: Nicole Contreras           Date of Birth: September 29, 1946           MRN: [id number] Visit Date: 09/16/2021              Requested by: Steele Sizer, MD [address] [place],  [place] [address] PCP: Steele Sizer, MD  No chief complaint on file.     HPI: Patient is a pleasant 75 year old woman with a chief complaint of right knee pain and swelling.  She has seen Dr. Junius Roads in the past for left knee pain.  She says over the weekend she was just vacuuming and began to have tightness in pain in her right knee.  While the pain has not been very significant she feels like she is accumulated some fluid in the knee that is making her knee feel tight.  She finds this most pronounced in the back of her knee.  Does cause a little discomfort running down the back of her leg.  She does have a history of psoriatic arthritis   Assessment & Plan: Visit Diagnoses:  1. Acute pain of right knee     Plan: Patient with a history of psoriatic arthritis with knee effusion   She did feel better once the fluid was drawn off.  Exam was fairly benign except for effusion.  She did not have any signs of infection did not have any particular joint pain she did have some fullness in the back of her knee.  She will follow-up in 3 weeks  Follow-Up Instructions: No follow-ups on file.   Ortho Exam  Patient is alert, oriented, no adenopathy, well-dressed, normal affect, normal respiratory effort. Examination of her right knee she has no cellulitis she has no erythema.  She has no tenderness over the medial or lateral joint line.  She is able to hold a straight leg raise.  She has good varus and valgus stability.  Good endpoint on Lachman testing.  She does have an effusion  Imaging: No results found. No images are attached to the encounter.  Labs: Lab Results  Component Value Date   HGBA1C 5.5 08/08/2017   REPTSTATUS 11/15/2017 FINAL 11/12/2017   CULT >=100,000  COLONIES/mL PROTEUS MIRABILIS (A) 11/12/2017   LABORGA PROTEUS MIRABILIS (A) 11/12/2017     Lab Results  Component Value Date   ALBUMIN 4.3 05/30/2016    Lab Results  Component Value Date   MG 1.9 09/02/2016   Lab Results  Component Value Date   VD25OH 39 08/08/2017    No results found for: PREALBUMIN CBC EXTENDED Latest Ref Rng & Units 08/16/2019 08/08/2017  WBC 3.8 - 10.8 Thousand/uL 8.3 8.5  RBC 3.80 - 5.10 Million/uL 4.52 4.71  HGB 11.7 - 15.5 g/dL 12.5 12.4  HCT 35.0 - 45.0 % 38.2 38.4  PLT 140 - 400 Thousand/uL 298 295  NEUTROABS 1,500 - 7,800 cells/uL 5,868 5,891  LYMPHSABS 850 - 3,900 cells/uL 1,801 1,853     There is no height or weight on file to calculate BMI.  Orders:  Orders Placed This Encounter  Procedures  . XR Knee 1-2 Views Right   No orders of the defined types were placed in this encounter.    Procedures: Large Joint Inj: R knee on 09/16/2021 11:19 AM Indications: pain and diagnostic evaluation Details: 22 G 1.5 in needle, superolateral approach  Arthrogram: No  Medications: 40 mg methylPREDNISolone acetate 40 MG/ML; 2  mL lidocaine 1 % Aspirate: 55 mL serous Outcome: tolerated well, no immediate complications  After obtaining verbal consent and sterilely prepping with alcohol and Betadine the superior lateral aspect of her knee 55 cc of serous fluid was aspirated.  This was clear.  40 mg of methylprednisolone was injected.  Patient tolerated this well Procedure, treatment alternatives, risks and benefits explained, specific risks discussed. Consent was given by the patient.    Clinical Data: No additional findings.  ROS:  All other systems negative, except as noted in the HPI. Review of Systems  Objective: Vital Signs: There were no vitals taken for this visit.  Specialty Comments:  No specialty comments available.  PMFS History: Patient Active Problem List   Diagnosis Date Noted  . History of mastectomy, right 11/27/2018  .  Internal hemorrhoids 12/01/2016  . Benign neoplasm of transverse colon   . Numerous moles 11/30/2015  . Gastro-esophageal reflux disease without esophagitis 09/14/2015  . Acquired hypothyroidism 09/14/2015  . Menopause 09/14/2015  . Hemorrhage, postmenopausal 09/14/2015  . Vitamin D deficiency 09/14/2015  . History of breast cancer 09/14/2015  . Psoriatic arthritis ([place]) 05/22/2014   Past Medical History:  Diagnosis Date  . Arthritis   . Atypical chest pain    a. 03/2014 ETT: Ex time 4:10, Max HR 160 bpm, no acute st/t changes.  . Breast cancer ([place]) 1986   s/p radiation.  . Bright's disease    Hx  . Diastolic dysfunction    a. 02/2014 Echo: EF 55-60%, no rwma, Gr1 DD.  . Dizziness and giddiness   . Fecal smearing   . GERD (gastroesophageal reflux disease)   . Heart murmur    as child  . Insomnia, unspecified   . Obesity, unspecified   . Personal history of chemotherapy   . Personal history of radiation therapy 1986   right breast ca  . PONV (postoperative nausea and vomiting)    after cataract procedure  . Postmenopausal bleeding   . Psoriasis   . Psoriatic arthropathy ([place])   . Reflux esophagitis   . Right cataract    a. 01/2016 s/p cataract surgery.  Marland Kitchen Spasm of muscle   . Unspecified hypothyroidism   . Unspecified urinary incontinence   . Unspecified vitamin D deficiency     Family History  Problem Relation Age of Onset  . Uterine cancer Mother   . Lung cancer Mother   . Stroke Mother   . Hypertension Mother   . Dementia Mother   . Heart disease Father   . Arrhythmia Father        afib  . Cervical cancer Sister   . Arrhythmia Sister        afib  . Breast cancer Sister 49  . Breast cancer Paternal Aunt 18    Past Surgical History:  Procedure Laterality Date  . BREAST EXCISIONAL BIOPSY Left 1990   X 2- neg  . CARDIAC CATHETERIZATION  [facility]'s   [facility]; no stent  . CATARACT EXTRACTION W/PHACO Right 02/04/2016   Procedure: CATARACT EXTRACTION PHACO AND  INTRAOCULAR LENS PLACEMENT (IOC);  Surgeon: Birder Robson, MD;  Location: [facility];  Service: Ophthalmology;  Laterality: Right;  [place] 00:44 AP% 19.2 CDE 8.49 fluid pack lot # [id number]  . CATARACT EXTRACTION W/PHACO Left 06/12/2018   Procedure: CATARACT EXTRACTION PHACO AND INTRAOCULAR LENS PLACEMENT (IOC);  Surgeon: Birder Robson, MD;  Location: [facility];  Service: Ophthalmology;  Laterality: Left;  [place] 00:56.8 AP% 15.8 CDE 8.95 Fluid Pack lot # [id number] H  .  colonoscopy    . COLONOSCOPY WITH PROPOFOL N/A 07/15/2016   Procedure: COLONOSCOPY WITH PROPOFOL;  Surgeon: [name], MD;  Location: [place];  Service: Endoscopy;  Laterality: N/A;  . ENDOMETRIAL BIOPSY    . MASTECTOMY Right 1985  . POLYPECTOMY N/A 07/15/2016   Procedure: POLYPECTOMY;  Surgeon: [name], MD;  Location: [place];  Service: Endoscopy;  Laterality: N/A;   Social History   Occupational History  . Occupation: Retired  Tobacco Use  . Smoking status: Former    Packs/day: 0.25    Years: 10.00    Pack years: 2.50    Types: Cigarettes    Quit date: 11/07/1990    Years since quitting: 30.8  . Smokeless tobacco: Never  . Tobacco comments:    smoking cessation materials not required  Vaping Use  . Vaping Use: Never used  Substance and Sexual Activity  . Alcohol use: No    Alcohol/week: 0.0 standard drinks  . Drug use: No  . Sexual activity: Not Currently

## 2021-09-16 NOTE — Progress Notes (Signed)
Name: Nicole Contreras   MRN: [id number]    DOB: [birth year]   Date:09/17/2021       Progress Note  Subjective  Chief Complaint  Follow Up  HPI  Psoriatic arthritis:  She had dysuria with Sulfasalazine, Methotrexate was ineffective, she was on Enbrel but she stopped because she felt it always makes her sick, Dr. Jefm Bryant advised Humira but she does not want to take it at this time.  Last year she had a flare on left knee, she has psoriatic arthritis of right shoulder and saw Ortho who gave her Relafen but over the past week she states about 10 days ago she was vacuuming her house and felt a pop on right knee , she was seen at [facility] ortho and had fluid drained from her right knee and is doing better today, she will follow up with Ortho. She does not want to go back to Rheumatologist    Psoriasis: she sees Dr. Kellie Moor.She is using topical medication and worse is on her elbows. Also uses a solution/clobetasol  on her scalp and she states symptoms are controlled    Hypothyroidism: taking medication daily without food, no palpitation or dysphagia, denies change in bowel movements or hair loss. Same does for a long time and we will recheck it today    History of breast cancer: she had right mastectomy in 1985, had local recurrence in 1986 had recurrence on her skin and had to have chemo and radiation. She never had re-constructive surgery at the time because of the chemo and radiation, she wears a prosthesis. Up to date with mammograms of left breast - done 04/2021   Senile purpura; both arms. Unchanged   Patient Active Problem List   Diagnosis Date Noted   History of mastectomy, right 11/27/2018   Internal hemorrhoids 12/01/2016   Benign neoplasm of transverse colon    Numerous moles 11/30/2015   Gastro-esophageal reflux disease without esophagitis 09/14/2015   Acquired hypothyroidism 09/14/2015   Menopause 09/14/2015   Hemorrhage, postmenopausal 09/14/2015   Vitamin D deficiency 09/14/2015    History of breast cancer 09/14/2015   Psoriatic arthritis ([place]) 05/22/2014    Past Surgical History:  Procedure Laterality Date   BREAST EXCISIONAL BIOPSY Left 1990   X 2- neg   CARDIAC CATHETERIZATION  [facility]'s   [facility]; no stent   CATARACT EXTRACTION W/PHACO Right 02/04/2016   Procedure: CATARACT EXTRACTION PHACO AND INTRAOCULAR LENS PLACEMENT ([place]);  Surgeon: Birder Robson, MD;  Location: [facility];  Service: Ophthalmology;  Laterality: Right;  [place] 00:44 AP% 19.2 CDE 8.49 fluid pack lot # [id number]   CATARACT EXTRACTION W/PHACO Left 06/12/2018   Procedure: CATARACT EXTRACTION PHACO AND INTRAOCULAR LENS PLACEMENT (IOC);  Surgeon: Birder Robson, MD;  Location: [facility];  Service: Ophthalmology;  Laterality: Left;  [place] 00:56.8 AP% 15.8 CDE 8.95 Fluid Pack lot # [id number]   colonoscopy     COLONOSCOPY WITH PROPOFOL N/A 07/15/2016   Procedure: COLONOSCOPY WITH PROPOFOL;  Surgeon: [name], MD;  Location: [place];  Service: Endoscopy;  Laterality: N/A;   ENDOMETRIAL BIOPSY     MASTECTOMY Right 1985   POLYPECTOMY N/A 07/15/2016   Procedure: POLYPECTOMY;  Surgeon: [name], MD;  Location: [place];  Service: Endoscopy;  Laterality: N/A;    Family History  Problem Relation Age of Onset   Uterine cancer Mother    Lung cancer Mother    Stroke Mother    Hypertension Mother    Dementia Mother  Heart disease Father    Arrhythmia Father        afib   Cervical cancer Sister    Arrhythmia Sister        afib   Breast cancer Sister 54   Breast cancer Paternal Aunt 86    Social History   Tobacco Use   Smoking status: Former    Packs/day: 0.25    Years: 10.00    Pack years: 2.50    Types: Cigarettes    Quit date: 11/07/1990    Years since quitting: 30.8   Smokeless tobacco: Never   Tobacco comments:    smoking cessation materials not required  Substance Use Topics   Alcohol use: No    Alcohol/week: 0.0 standard drinks     Current Outpatient  Medications:    cholecalciferol (VITAMIN D) 1000 units tablet, Take 1,000 Units by mouth daily. , Disp: , Rfl:    Clobetasol Propionate (TEMOVATE) 0.05 % external spray, Apply topically 2 (two) times daily. (Patient taking differently: Apply 1 application topically 2 (two) times daily as needed (for psoriasis).), Disp: 59 mL, Rfl: 0   levothyroxine (SYNTHROID) 75 MCG tablet, Take 1 tablet (75 mcg total) by mouth daily., Disp: 90 tablet, Rfl: 0   Multiple Vitamin (MULTIVITAMIN) tablet, Take 1 tablet by mouth daily., Disp: , Rfl:    NONFORMULARY OR COMPOUNDED ITEM, in the morning and at bedtime., Disp: , Rfl:    zinc gluconate 50 MG tablet, Take 50 mg by mouth daily., Disp: , Rfl:    nabumetone (RELAFEN) 500 MG tablet, Take 1 tablet (500 mg total) by mouth 2 (two) times daily as needed. (Patient not taking: Reported on 09/17/2021), Disp: 60 tablet, Rfl: 3  Allergies  Allergen Reactions   Sulfa Antibiotics Rash    I personally reviewed active problem list, medication list, allergies, family history, social history, health maintenance with the patient/caregiver today.   ROS  Constitutional: Negative for fever or weight change.  Respiratory: Negative for cough and shortness of breath.   Cardiovascular: Negative for chest pain or palpitations.  Gastrointestinal: Negative for abdominal pain, no bowel changes.  Musculoskeletal: Negative for gait problem or joint swelling.  Skin: Negative for rash.  Neurological: Negative for dizziness or headache.  No other specific complaints in a complete review of systems (except as listed in HPI above).   Objective  Vitals:   09/17/21 1423 09/17/21 1438  BP: (!) 142/74 140/72  Pulse: 90   Resp: 16   Temp: 97.8 F (36.6 C)   TempSrc: Oral   SpO2: 99%   Weight: 184 lb 8 oz (83.7 kg)   Height: 5\' 7"  (1.702 m)     Body mass index is 28.9 kg/m.  Physical Exam  Constitutional: Patient appears well-developed and well-nourished. Overweight.  No  distress.  HEENT: head atraumatic, normocephalic, pupils equal and reactive to light, neck supple Cardiovascular: Normal rate, regular rhythm and normal heart sounds.  No murmur heard. No BLE edema. Pulmonary/Chest: Effort normal and breath sounds normal. No respiratory distress. Abdominal: Soft.  There is no tenderness. Psychiatric: Patient has a normal mood and affect. behavior is normal. Judgment and thought content normal.    PHQ2/9: Depression screen [facility] 2/9 09/17/2021 10/22/2020 09/16/2020 03/03/2020 08/16/2019  Decreased Interest 0 0 0 0 0  Down, Depressed, Hopeless 0 0 0 0 0  PHQ - 2 Score 0 0 0 0 0  Altered sleeping 0 - - 0 0  Tired, decreased energy 0 - - 0 0  Change in appetite 0 - - 0 0  Feeling bad or failure about yourself  0 - - 0 0  Trouble concentrating 0 - - 0 0  Moving slowly or fidgety/restless 0 - - 0 0  Suicidal thoughts 0 - - 0 0  PHQ-9 Score 0 - - 0 0  Difficult doing work/chores Not difficult at all - - - Not difficult at all  Some recent data might be hidden    phq 9 is negative   Fall Risk: Fall Risk  09/17/2021 10/22/2020 09/16/2020 03/03/2020 08/16/2019  Falls in the past year? 0 0 0 0 0  Number falls in past yr: 0 0 0 0 0  Injury with Fall? 0 0 0 0 0  Risk for fall due to : No Fall Risks No Fall Risks - - -  Risk for fall due to: Comment - - - - -  Follow up Falls prevention discussed Falls prevention discussed - - -     Assessment & Plan  1. Psoriatic arthritis ([place])  - COMPLETE METABOLIC PANEL WITH GFR - CBC with Differential/Platelet  2. Psoriasis   3. Senile purpura ([facility])  - CBC with Differential/Platelet  4. History of mastectomy, right   5. 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 Needs flu shot  refused  8. Need for COVID-19 vaccine   9. Need for shingles vaccine  - Zoster Vaccine Adjuvanted [facility]) injection; Inject 0.5 mLs into the muscle once for 1 dose.  Dispense: 0.5 mL; Refill:  1  10. Long-term use of high-risk medication  - COMPLETE METABOLIC PANEL WITH GFR - CBC with Differential/Platelet

## 2021-09-17 ENCOUNTER — Ambulatory Visit: Payer: Medicare PPO | Admitting: Family Medicine

## 2021-09-17 ENCOUNTER — Encounter: Payer: Self-pay | Admitting: Family Medicine

## 2021-09-17 VITALS — BP 140/72 | HR 90 | Temp 97.8°F | Resp 16 | Ht 67.0 in | Wt 184.5 lb

## 2021-09-17 DIAGNOSIS — Z79899 Other long term (current) drug therapy: Secondary | ICD-10-CM

## 2021-09-17 DIAGNOSIS — E039 Hypothyroidism, unspecified: Secondary | ICD-10-CM | POA: Diagnosis not present

## 2021-09-17 DIAGNOSIS — Z9011 Acquired absence of right breast and nipple: Secondary | ICD-10-CM

## 2021-09-17 DIAGNOSIS — M8580 Other specified disorders of bone density and structure, unspecified site: Secondary | ICD-10-CM

## 2021-09-17 DIAGNOSIS — L4050 Arthropathic psoriasis, unspecified: Secondary | ICD-10-CM

## 2021-09-17 DIAGNOSIS — D692 Other nonthrombocytopenic purpura: Secondary | ICD-10-CM | POA: Diagnosis not present

## 2021-09-17 DIAGNOSIS — Z23 Encounter for immunization: Secondary | ICD-10-CM | POA: Diagnosis not present

## 2021-09-17 DIAGNOSIS — Z780 Asymptomatic menopausal state: Secondary | ICD-10-CM | POA: Diagnosis not present

## 2021-09-17 DIAGNOSIS — L409 Psoriasis, unspecified: Secondary | ICD-10-CM

## 2021-09-17 MED ORDER — SHINGRIX 50 MCG/0.5ML IM SUSR: 0.5000 mL | Freq: Once | INTRAMUSCULAR | 1 refills | Status: AC

## 2021-09-18 LAB — COMPLETE METABOLIC PANEL WITH GFR
AG Ratio: 1.8 (calc) (ref 1.0–2.5)
ALT: 11 U/L (ref 6–29)
AST: 15 U/L (ref 10–35)
Albumin: 4.4 g/dL (ref 3.6–5.1)
Alkaline phosphatase (APISO): 104 U/L (ref 37–153)
BUN: 20 mg/dL (ref 7–25)
CO2: 27 mmol/L (ref 20–32)
Calcium: 10 mg/dL (ref 8.6–10.4)
Chloride: 105 mmol/L (ref 98–110)
Creat: 0.76 mg/dL (ref 0.60–1.00)
Globulin: 2.4 g/dL (calc) (ref 1.9–3.7)
Glucose, Bld: 113 mg/dL — ABNORMAL HIGH (ref 65–99)
Potassium: 4.6 mmol/L (ref 3.5–5.3)
Sodium: 141 mmol/L (ref 135–146)
Total Bilirubin: 0.3 mg/dL (ref 0.2–1.2)
Total Protein: 6.8 g/dL (ref 6.1–8.1)
eGFR: 83 mL/min/{1.73_m2} (ref >=60)

## 2021-09-18 LAB — CBC WITH DIFFERENTIAL/PLATELET
Absolute Monocytes: 685 cells/uL (ref 200–950)
Basophils Absolute: 45 cells/uL (ref 0–200)
Basophils Relative: 0.3 %
Eosinophils Absolute: 30 cells/uL (ref 15–500)
Eosinophils Relative: 0.2 %
HCT: 38.2 % (ref 35.0–45.0)
Hemoglobin: 12.6 g/dL (ref 11.7–15.5)
Lymphs Abs: 1311 cells/uL (ref 850–3900)
MCH: 27.8 pg (ref 27.0–33.0)
MCHC: 33 g/dL (ref 32.0–36.0)
MCV: 84.3 fL (ref 80.0–100.0)
MPV: 9.9 fL (ref 7.5–12.5)
Monocytes Relative: 4.6 %
Neutro Abs: 12829 cells/uL — ABNORMAL HIGH (ref 1500–7800)
Neutrophils Relative %: 86.1 %
Platelets: 292 10*3/uL (ref 140–400)
RBC: 4.53 10*6/uL (ref 3.80–5.10)
RDW: 13.2 % (ref 11.0–15.0)
Total Lymphocyte: 8.8 %
WBC: 14.9 10*3/uL — ABNORMAL HIGH (ref 3.8–10.8)

## 2021-09-18 LAB — TSH: TSH: 0.53 mIU/L (ref 0.40–4.50)

## 2021-09-19 ENCOUNTER — Other Ambulatory Visit: Payer: Self-pay | Admitting: Family Medicine

## 2021-09-19 DIAGNOSIS — L4050 Arthropathic psoriasis, unspecified: Secondary | ICD-10-CM

## 2021-09-19 DIAGNOSIS — D72829 Elevated white blood cell count, unspecified: Secondary | ICD-10-CM

## 2021-09-27 DIAGNOSIS — L4050 Arthropathic psoriasis, unspecified: Secondary | ICD-10-CM | POA: Diagnosis not present

## 2021-09-27 DIAGNOSIS — D72829 Elevated white blood cell count, unspecified: Secondary | ICD-10-CM | POA: Diagnosis not present

## 2021-09-28 LAB — CBC WITH DIFFERENTIAL/PLATELET
Absolute Monocytes: 551 cells/uL (ref 200–950)
Basophils Absolute: 49 cells/uL (ref 0–200)
Basophils Relative: 0.6 %
Eosinophils Absolute: 146 cells/uL (ref 15–500)
Eosinophils Relative: 1.8 %
HCT: 39.5 % (ref 35.0–45.0)
Hemoglobin: 13.2 g/dL (ref 11.7–15.5)
Lymphs Abs: 1887 cells/uL (ref 850–3900)
MCH: 28.4 pg (ref 27.0–33.0)
MCHC: 33.4 g/dL (ref 32.0–36.0)
MCV: 85.1 fL (ref 80.0–100.0)
MPV: 9.7 fL (ref 7.5–12.5)
Monocytes Relative: 6.8 %
Neutro Abs: 5468 cells/uL (ref 1500–7800)
Neutrophils Relative %: 67.5 %
Platelets: 224 10*3/uL (ref 140–400)
RBC: 4.64 10*6/uL (ref 3.80–5.10)
RDW: 13.4 % (ref 11.0–15.0)
Total Lymphocyte: 23.3 %
WBC: 8.1 10*3/uL (ref 3.8–10.8)

## 2021-09-28 LAB — SEDIMENTATION RATE: Sed Rate: 11 mm/h (ref 0–30)

## 2021-09-28 LAB — C-REACTIVE PROTEIN: CRP: 9.7 mg/L — ABNORMAL HIGH (ref <8.0)

## 2021-10-07 ENCOUNTER — Ambulatory Visit: Payer: Medicare PPO | Admitting: Orthopaedic Surgery

## 2021-10-07 ENCOUNTER — Other Ambulatory Visit: Payer: Self-pay

## 2021-10-07 ENCOUNTER — Encounter: Payer: Self-pay | Admitting: Orthopaedic Surgery

## 2021-10-07 DIAGNOSIS — M170 Bilateral primary osteoarthritis of knee: Secondary | ICD-10-CM | POA: Diagnosis not present

## 2021-10-07 NOTE — Progress Notes (Signed)
Office Visit Note   Patient: Nicole Contreras           Date of Birth: 24-Apr-1946           MRN: [id number] Visit Date: 10/07/2021              Requested by: Steele Sizer, [address] [place],   [id number] PCP: Steele Sizer, MD   Assessment & Plan: Visit Diagnoses:  1. Bilateral primary osteoarthritis of knee     Plan: Mrs. Stiver was seen last month for evaluation of right knee pain.  X-rays demonstrated some mild narrowing of the medial joint space.  Films of her left knee actually showed more narrowing of the medial joint space consistent with arthritis.  She does have history of psoriatic arthritis and is being followed by rheumatologist in [place].  At 1 point she was on methotrexate and Enbrel.  I aspirated her knee last month and injected cortisone and she notes that it helped for short time only to have it recur.  I am not sure if she has a combination of psoriatic arthritis and primary osteoarthritis but it might be worth having her check back with a rheumatologist.  In the meantime I have suggested wearing a pullover knee support in the winter when she is out and about and considering nonweightbearing exercises like an exercise bike GERD machines.  We have also discussed over-the-counter medicines and even Voltaren gel and may be CBD oil.  We will plan to see her back as needed  Follow-Up Instructions: Return if symptoms worsen or fail to improve.   Orders:  No orders of the defined types were placed in this encounter.  No orders of the defined types were placed in this encounter.     Procedures: No procedures performed   Clinical Data: No additional findings.   Subjective: Chief Complaint  Patient presents with   Right Knee - Pain  Patient presents today for a three week follow up on her right knee. She said that it got better for a few days. The pain then returned medially. She said that it hurts to walk outside on uneven  surfaces. She is taking Aleve as needed.   HPI  Review of Systems   Objective: Vital Signs: There were no vitals taken for this visit.  Physical Exam Constitutional:      Appearance: She is well-developed.  Pulmonary:     Effort: Pulmonary effort is normal.  Skin:    General: Skin is warm and dry.  Neurological:     Mental Status: She is alert and oriented to person, place, and time.  Psychiatric:        Behavior: Behavior normal.    Ortho Exam right knee was not hot red warm or swollen.  Minimal effusion.  Just minimal medial joint pain.  No lateral joint pain or patella discomfort.  Full quick extension.  No use of ambulatory aid and walks without a limp.  Flexed over 100 degrees.  No instability  Specialty Comments:  No specialty comments available.  Imaging: No results found.   PMFS History: Patient Active Problem List   Diagnosis Date Noted   Bilateral primary osteoarthritis of knee 10/07/2021   History of mastectomy, right 11/27/2018   Internal hemorrhoids 12/01/2016   Benign neoplasm of transverse colon    Numerous moles 11/30/2015   Gastro-esophageal reflux disease without esophagitis 09/14/2015   Acquired hypothyroidism 09/14/2015   Menopause 09/14/2015   Hemorrhage, postmenopausal 09/14/2015  Vitamin D deficiency 09/14/2015   History of breast cancer 09/14/2015   Psoriatic arthritis ([place]) 05/22/2014   Past Medical History:  Diagnosis Date   Arthritis    Atypical chest pain    a. 03/2014 ETT: Ex time 4:10, Max HR 160 bpm, no acute st/t changes.   Breast cancer ([facility]) 1986   s/p radiation.   Bright's disease    Hx   Diastolic dysfunction    a. 02/2014 Echo: EF 55-60%, no rwma, Gr1 DD.   Dizziness and giddiness    Fecal smearing    GERD (gastroesophageal reflux disease)    Heart murmur    as child   Insomnia, unspecified    Obesity, unspecified    Personal history of chemotherapy    Personal history of radiation therapy 1986   right breast ca    PONV (postoperative nausea and vomiting)    after cataract procedure   Postmenopausal bleeding    Psoriasis    Psoriatic arthropathy ([facility])    Reflux esophagitis    Right cataract    a. 01/2016 s/p cataract surgery.   Spasm of muscle    Unspecified hypothyroidism    Unspecified urinary incontinence    Unspecified vitamin D deficiency     Family History  Problem Relation Age of Onset   Uterine cancer Mother    Lung cancer Mother    Stroke Mother    Hypertension Mother    Dementia Mother    Heart disease Father    Arrhythmia Father        afib   Cervical cancer Sister    Arrhythmia Sister        afib   Breast cancer Sister 48   Breast cancer Paternal Aunt 73    Past Surgical History:  Procedure Laterality Date   BREAST EXCISIONAL BIOPSY Left 1990   X 2- neg   CARDIAC CATHETERIZATION  90's   [facility]; no stent   CATARACT EXTRACTION W/PHACO Right 02/04/2016   Procedure: CATARACT EXTRACTION PHACO AND INTRAOCULAR LENS PLACEMENT ([place]);  Surgeon: Birder Robson, MD;  Location: [facility];  Service: Ophthalmology;  Laterality: Right;  [place] 00:44 AP% 19.2 CDE 8.49 fluid pack lot # [id number]   CATARACT EXTRACTION W/PHACO Left 06/12/2018   Procedure: CATARACT EXTRACTION PHACO AND INTRAOCULAR LENS PLACEMENT ([facility]);  Surgeon: Birder Robson, MD;  Location: [facility];  Service: Ophthalmology;  Laterality: Left;  [place] 00:56.8 AP% 15.8 CDE 8.95 Fluid Pack lot # [id number]   colonoscopy     COLONOSCOPY WITH PROPOFOL N/A 07/15/2016   Procedure: COLONOSCOPY WITH PROPOFOL;  Surgeon: [name], MD;  Location: [facility];  Service: Endoscopy;  Laterality: N/A;   ENDOMETRIAL BIOPSY     MASTECTOMY Right 1985   POLYPECTOMY N/A 07/15/2016   Procedure: POLYPECTOMY;  Surgeon: [name], MD;  Location: [facility];  Service: Endoscopy;  Laterality: N/A;   Social History   Occupational History   Occupation: Retired  Tobacco Use   Smoking status: Former    Packs/day: 0.25    Years:  10.00    Pack years: 2.50    Types: Cigarettes    Quit date: 11/07/1990    Years since quitting: 30.9   Smokeless tobacco: Never   Tobacco comments:    smoking cessation materials not required  Vaping Use   Vaping Use: Never used  Substance and Sexual Activity   Alcohol use: No    Alcohol/week: 0.0 standard drinks   Drug use: No   Sexual activity: Not  Currently

## 2021-10-26 ENCOUNTER — Ambulatory Visit (INDEPENDENT_AMBULATORY_CARE_PROVIDER_SITE_OTHER): Payer: Medicare PPO

## 2021-10-26 DIAGNOSIS — Z780 Asymptomatic menopausal state: Secondary | ICD-10-CM

## 2021-10-26 DIAGNOSIS — Z0000 Encounter for general adult medical examination without abnormal findings: Secondary | ICD-10-CM

## 2021-10-26 NOTE — Patient Instructions (Signed)
Nicole Contreras , Thank you for taking time to come for your Medicare Wellness Visit. I appreciate your ongoing commitment to your health goals. Please review the following plan we discussed and let me know if I can assist you in the future.   Screening recommendations/referrals: Colonoscopy: done 07/15/16 Mammogram: done 04/08/21 Bone Density: done 08/21/13. Please call [phone] to schedule your bone density screening.  Recommended yearly ophthalmology/optometry visit for glaucoma screening and checkup Recommended yearly dental visit for hygiene and checkup  Vaccinations: Influenza vaccine: due Pneumococcal vaccine: done 08/16/19 Tdap vaccine: done 09/16/20 Shingles vaccine: Shingrix discussed. Please contact your pharmacy for coverage information.  Covid-19:declined  Advanced directives: Advance directive discussed with you today. I have provided a copy for you to complete at home and have notarized. Once this is complete please bring a copy in to our office so we can scan it into your chart.   Conditions/risks identified: Recommend increasing physical activity as tolerated  Next appointment: Follow up in one year for your annual wellness visit    Preventive Care 65 Years and Older, Female Preventive care refers to lifestyle choices and visits with your health care provider that can promote health and wellness. What does preventive care include? A yearly physical exam. This is also called an annual well check. Dental exams once or twice a year. Routine eye exams. Ask your health care provider how often you should have your eyes checked. Personal lifestyle choices, including: Daily care of your teeth and gums. Regular physical activity. Eating a healthy diet. Avoiding tobacco and drug use. Limiting alcohol use. Practicing safe sex. Taking low-dose aspirin every day. Taking vitamin and mineral supplements as recommended by your health care provider. What happens during an annual  well check? The services and screenings done by your health care provider during your annual well check will depend on your age, overall health, lifestyle risk factors, and family history of disease. Counseling  Your health care provider may ask you questions about your: Alcohol use. Tobacco use. Drug use. Emotional well-being. Home and relationship well-being. Sexual activity. Eating habits. History of falls. Memory and ability to understand (cognition). Work and work Statistician. Reproductive health. Screening  You may have the following tests or measurements: Height, weight, and BMI. Blood pressure. Lipid and cholesterol levels. These may be checked every 5 years, or more frequently if you are over 69 years old. Skin check. Lung cancer screening. You may have this screening every year starting at age 58 if you have a 30-pack-year history of smoking and currently smoke or have quit within the past 15 years. Fecal occult blood test (FOBT) of the stool. You may have this test every year starting at age 19. Flexible sigmoidoscopy or colonoscopy. You may have a sigmoidoscopy every 5 years or a colonoscopy every 10 years starting at age 68. Hepatitis C blood test. Hepatitis B blood test. Sexually transmitted disease (STD) testing. Diabetes screening. This is done by checking your blood sugar (glucose) after you have not eaten for a while (fasting). You may have this done every 1-3 years. Bone density scan. This is done to screen for osteoporosis. You may have this done starting at age 83. Mammogram. This may be done every 1-2 years. Talk to your health care provider about how often you should have regular mammograms. Talk with your health care provider about your test results, treatment options, and if necessary, the need for more tests. Vaccines  Your health care provider may recommend certain vaccines, such as: Influenza  vaccine. This is recommended every year. Tetanus, diphtheria,  and acellular pertussis (Tdap, Td) vaccine. You may need a Td booster every 10 years. Zoster vaccine. You may need this after age 14. Pneumococcal 13-valent conjugate (PCV13) vaccine. One dose is recommended after age 15. Pneumococcal polysaccharide (PPSV23) vaccine. One dose is recommended after age 38. Talk to your health care provider about which screenings and vaccines you need and how often you need them. This information is not intended to replace advice given to you by your health care provider. Make sure you discuss any questions you have with your health care provider. Document Released: 11/20/2015 Document Revised: 07/13/2016 Document Reviewed: 08/25/2015 Elsevier Interactive Patient Education  2017 [place] Prevention in the Home Falls can cause injuries. They can happen to people of all ages. There are many things you can do to make your home safe and to help prevent falls. What can I do on the outside of my home? Regularly fix the edges of walkways and driveways and fix any cracks. Remove anything that might make you trip as you walk through a door, such as a raised step or threshold. Trim any bushes or trees on the path to your home. Use bright outdoor lighting. Clear any walking paths of anything that might make someone trip, such as rocks or tools. Regularly check to see if handrails are loose or broken. Make sure that both sides of any steps have handrails. Any raised decks and porches should have guardrails on the edges. Have any leaves, snow, or ice cleared regularly. Use sand or salt on walking paths during winter. Clean up any spills in your garage right away. This includes oil or grease spills. What can I do in the bathroom? Use night lights. Install grab bars by the toilet and in the tub and shower. Do not use towel bars as grab bars. Use non-skid mats or decals in the tub or shower. If you need to sit down in the shower, use a plastic, non-slip  stool. Keep the floor dry. Clean up any water that spills on the floor as soon as it happens. Remove soap buildup in the tub or shower regularly. Attach bath mats securely with double-sided non-slip rug tape. Do not have throw rugs and other things on the floor that can make you trip. What can I do in the bedroom? Use night lights. Make sure that you have a light by your bed that is easy to reach. Do not use any sheets or blankets that are too big for your bed. They should not hang down onto the floor. Have a firm chair that has side arms. You can use this for support while you get dressed. Do not have throw rugs and other things on the floor that can make you trip. What can I do in the kitchen? Clean up any spills right away. Avoid walking on wet floors. Keep items that you use a lot in easy-to-reach places. If you need to reach something above you, use a strong step stool that has a grab bar. Keep electrical cords out of the way. Do not use floor polish or wax that makes floors slippery. If you must use wax, use non-skid floor wax. Do not have throw rugs and other things on the floor that can make you trip. What can I do with my stairs? Do not leave any items on the stairs. Make sure that there are handrails on both sides of the stairs and use them. Fix  handrails that are broken or loose. Make sure that handrails are as long as the stairways. Check any carpeting to make sure that it is firmly attached to the stairs. Fix any carpet that is loose or worn. Avoid having throw rugs at the top or bottom of the stairs. If you do have throw rugs, attach them to the floor with carpet tape. Make sure that you have a light switch at the top of the stairs and the bottom of the stairs. If you do not have them, ask someone to add them for you. What else can I do to help prevent falls? Wear shoes that: Do not have high heels. Have rubber bottoms. Are comfortable and fit you well. Are closed at the  toe. Do not wear sandals. If you use a stepladder: Make sure that it is fully opened. Do not climb a closed stepladder. Make sure that both sides of the stepladder are locked into place. Ask someone to hold it for you, if possible. Clearly mark and make sure that you can see: Any grab bars or handrails. First and last steps. Where the edge of each step is. Use tools that help you move around (mobility aids) if they are needed. These include: Canes. Walkers. Scooters. Crutches. Turn on the lights when you go into a dark area. Replace any light bulbs as soon as they burn out. Set up your furniture so you have a clear path. Avoid moving your furniture around. If any of your floors are uneven, fix them. If there are any pets around you, be aware of where they are. Review your medicines with your doctor. Some medicines can make you feel dizzy. This can increase your chance of falling. Ask your doctor what other things that you can do to help prevent falls. This information is not intended to replace advice given to you by your health care provider. Make sure you discuss any questions you have with your health care provider. Document Released: 08/20/2009 Document Revised: 03/31/2016 Document Reviewed: 11/28/2014 Elsevier Interactive Patient Education  2017 [employer].

## 2021-10-26 NOTE — Progress Notes (Signed)
Subjective:   Nicole Contreras is a 75 y.o. female who presents for Medicare Annual (Subsequent) preventive examination.  Virtual Visit via Telephone Note  I connected with  [name] on 10/26/21 at  2:10 PM EST by telephone and verified that I am speaking with the correct person using two identifiers.  Location: Patient: home Provider: Cayce Persons participating in the virtual visit: [place]   I discussed the limitations, risks, security and privacy concerns of performing an evaluation and management service by telephone and the availability of in person appointments. The patient expressed understanding and agreed to proceed.  Interactive audio and video telecommunications were attempted between this nurse and patient, however failed, due to patient having technical difficulties OR patient did not have access to video capability.  We continued and completed visit with audio only.  Some vital signs may be absent or patient reported.   Clemetine Marker, LPN   Review of Systems     Cardiac Risk Factors include: advanced age (>42men, >76 women)     Objective:    There were no vitals filed for this visit. There is no height or weight on file to calculate BMI.  Advanced Directives 10/26/2021 10/22/2020 03/14/2019 06/12/2018 03/09/2018 11/12/2017 08/08/2017  Does Patient Have a Medical Advance Directive? No No No No No No No  Would patient like information on creating a medical advance directive? Yes (MAU/Ambulatory/Procedural Areas - Information given) No - Patient declined Yes (MAU/Ambulatory/Procedural Areas - Information given) No - Patient declined Yes (MAU/Ambulatory/Procedural Areas - Information given) - -    Current Medications (verified) Outpatient Encounter Medications as of 10/26/2021  Medication Sig   cholecalciferol (VITAMIN D) 1000 units tablet Take 1,000 Units by mouth daily.    Clobetasol Propionate (TEMOVATE) 0.05 % external spray Apply  topically 2 (two) times daily. (Patient taking differently: Apply 1 application topically 2 (two) times daily as needed (for psoriasis).)   levothyroxine (SYNTHROID) 75 MCG tablet Take 1 tablet (75 mcg total) by mouth daily.   Multiple Vitamin (MULTIVITAMIN) tablet Take 1 tablet by mouth daily.   nabumetone (RELAFEN) 500 MG tablet Take 1 tablet (500 mg total) by mouth 2 (two) times daily as needed.   zinc gluconate 50 MG tablet Take 50 mg by mouth daily.   [DISCONTINUED] NONFORMULARY OR COMPOUNDED ITEM in the morning and at bedtime.   No facility-administered encounter medications on file as of 10/26/2021.    Allergies (verified) Sulfa antibiotics   History: Past Medical History:  Diagnosis Date   Arthritis    Atypical chest pain    a. 03/2014 ETT: Ex time 4:10, Max HR 160 bpm, no acute st/t changes.   Breast cancer ([place]) 1986   s/p radiation.   Bright's disease    Hx   Diastolic dysfunction    a. 02/2014 Echo: EF 55-60%, no rwma, Gr1 DD.   Dizziness and giddiness    Fecal smearing    GERD (gastroesophageal reflux disease)    Heart murmur    as child   Insomnia, unspecified    Obesity, unspecified    Personal history of chemotherapy    Personal history of radiation therapy 1986   right breast ca   PONV (postoperative nausea and vomiting)    after cataract procedure   Postmenopausal bleeding    Psoriasis    Psoriatic arthropathy (HCC)    Reflux esophagitis    Right cataract    a. 01/2016 s/p cataract surgery.   Spasm of muscle  Unspecified hypothyroidism    Unspecified urinary incontinence    Unspecified vitamin D deficiency    Past Surgical History:  Procedure Laterality Date   BREAST EXCISIONAL BIOPSY Left 1990   X 2- neg   CARDIAC CATHETERIZATION  90's   [facility]; no stent   CATARACT EXTRACTION W/PHACO Right 02/04/2016   Procedure: CATARACT EXTRACTION PHACO AND INTRAOCULAR LENS PLACEMENT (IOC);  Surgeon: Birder Robson, MD;  Location: [facility] ORS;  Service:  Ophthalmology;  Laterality: Right;  [place] 00:44 AP% 19.2 CDE 8.49 fluid pack lot # [id number]   CATARACT EXTRACTION W/PHACO Left 06/12/2018   Procedure: CATARACT EXTRACTION PHACO AND INTRAOCULAR LENS PLACEMENT (IOC);  Surgeon: Birder Robson, MD;  Location: [facility] OR[facility];  Service: Ophthalmology;  Laterality: Left;  [place] 00:56.8 AP% 15.8 CDE 8.95 Fluid Pack lot # [id number]   colonoscopy     COLONOSCOPY WITH PROPOFOL N/A 07/15/2016   Procedure: COLONOSCOPY WITH PROPOFOL;  Surgeon: [name], MD;  Location: [facility];  Service: Endoscopy;  Laterality: N/A;   ENDOMETRIAL BIOPSY     MASTECTOMY Right 1985   POLYPECTOMY N/A 07/15/2016   Procedure: POLYPECTOMY;  Surgeon: [name], MD;  Location: [facility];  Service: Endoscopy;  Laterality: N/A;   Family History  Problem Relation Age of Onset   Uterine cancer Mother    Lung cancer Mother    Stroke Mother    Hypertension Mother    Dementia Mother    Heart disease Father    Arrhythmia Father        afib   Cervical cancer Sister    Arrhythmia Sister        afib   Breast cancer Sister 82   Breast cancer Paternal Aunt 58   Social History   Socioeconomic History   Marital status: Married    Spouse name: Quillian Quince   Number of children: 2   Years of education: Not on file   Highest education level: Associate degree: occupational, Hotel manager, or vocational program  Occupational History   Occupation: Retired  Tobacco Use   Smoking status: Former    Packs/day: 0.25    Years: 10.00    Pack years: 2.50    Types: Cigarettes    Quit date: 11/07/1990    Years since quitting: 30.9   Smokeless tobacco: Never   Tobacco comments:    smoking cessation materials not required  Vaping Use   Vaping Use: Never used  Substance and Sexual Activity   Alcohol use: No    Alcohol/week: 0.0 standard drinks   Drug use: No   Sexual activity: Not Currently  Other Topics Concern   Not on file  Social History Narrative   Not on file   Social  Determinants of Health   Financial Resource Strain: Low Risk    Difficulty of Paying Living Expenses: Not hard at all  Food Insecurity: No Food Insecurity   Worried About Charity fundraiser in the Last Year: Never true   [place] in the Last Year: Never true  Transportation Needs: No Transportation Needs   Lack of Transportation (Medical): No   Lack of Transportation (Non-Medical): No  Physical Activity: Inactive   Days of Exercise per Week: 0 days   Minutes of Exercise per Session: 0 min  Stress: No Stress Concern Present   Feeling of Stress : Not at all  Social Connections: Socially Integrated   Frequency of Communication with Friends and Family: More than three times a week   Frequency of  Social Gatherings with Friends and Family: More than three times a week   Attends Religious Services: More than 4 times per year   Active Member of [employer] or Organizations: Yes   Attends Music therapist: More than 4 times per year   Marital Status: Married    Tobacco Counseling Counseling given: Not Answered Tobacco comments: smoking cessation materials not required   Clinical Intake:  Pre-visit preparation completed: Yes  Pain : No/denies pain     Nutritional Risks: None Diabetes: No  How often do you need to have someone help you when you read instructions, pamphlets, or other written materials from your doctor or pharmacy?: 1 - Never    Interpreter Needed?: No  Information entered by :: Clemetine Marker LPN   Activities of Daily Living In your present state of health, do you have any difficulty performing the following activities: 10/26/2021 09/17/2021  Hearing? N N  Vision? N N  Difficulty concentrating or making decisions? N N  Walking or climbing stairs? N N  Dressing or bathing? N N  Doing errands, shopping? N N  Preparing Food and eating ? N -  Using the Toilet? N -  In the past six months, have you accidently leaked urine? Y -  Comment pt wears  pads for protection -  Do you have problems with loss of bowel control? N -  Managing your Medications? N -  Managing your Finances? N -  Housekeeping or managing your Housekeeping? N -  Some recent data might be hidden    Patient Care Team: Steele Sizer, MD as PCP - General (Family Medicine) Emmaline Kluver., MD as Consulting Physician (Rheumatology) Oneta Rack, MD as Consulting Physician (Dermatology) Junius Roads, Legrand Como, MD as Anesthesiologist (Orthodontics)  Indicate any recent Medical Services you may have received from other than [facility] providers in the past year (date may be approximate).     Assessment:   This is a routine wellness examination for Ramon.  Hearing/Vision screen Hearing Screening - Comments:: Pt denies hearing difficulty  Vision Screening - Comments:: Annual vision screen[facility]   Dietary issues and exercise activities discussed: Current Exercise Habits: The patient does not participate in regular exercise at present, Exercise limited by: orthopedic condition(s)   Goals Addressed             This Visit's Progress    DIET - INCREASE WATER INTAKE   On track    Recommend to drink at least 6-8 8oz glasses of water per day.       Depression Screen PHQ 2/9 Scores 10/26/2021 09/17/2021 10/22/2020 09/16/2020 03/03/2020 08/16/2019 07/11/2019  PHQ - 2 Score 0 0 0 0 0 0 0  PHQ- 9 Score 0 0 - - 0 0 0    Fall Risk Fall Risk  10/26/2021 09/17/2021 10/22/2020 09/16/2020 03/03/2020  Falls in the past year? 0 0 0 0 0  Number falls in past yr: 0 0 0 0 0  Injury with Fall? 0 0 0 0 0  Risk for fall due to : No Fall Risks No Fall Risks No Fall Risks - -  Risk for fall due to: Comment - - - - -  Follow up Falls prevention discussed Falls prevention discussed Falls prevention discussed - -    FALL RISK PREVENTION PERTAINING TO THE HOME:  Any stairs in or around the home? Yes  If so, are there any without handrails?  No  Home free of  loose throw rugs in walkways, pet beds, electrical cords, etc? Yes  Adequate lighting in your home to reduce risk of falls? Yes   ASSISTIVE DEVICES UTILIZED TO PREVENT FALLS:  Life alert? No  Use of a cane, walker or w/c? No  Grab bars in the bathroom? Yes  Shower chair or bench in shower? Yes  Elevated toilet seat or a handicapped toilet? Yes   TIMED UP AND GO:  Was the test performed? No . Telephonic visit  Cognitive Function: Normal cognitive status assessed by direct observation by this Nurse Health Advisor. No abnormalities found.       6CIT Screen 03/14/2019 03/09/2018  What Year? 0 points 0 points  What month? 0 points 0 points  What time? 0 points 3 points  Count back from 20 0 points 0 points  Months in reverse 0 points 0 points  Repeat phrase 0 points 0 points  Total Score 0 3    Immunizations Immunization History  Administered Date(s) Administered   Fluad Quad(high Dose 65+) 08/16/2019   Influenza, High Dose Seasonal PF 08/08/2017   Influenza, Seasonal, Injecte, Preservative Fre 08/16/2012, 08/07/2013   Influenza,inj,Quad PF,6+ Mos 08/04/2014   Influenza,inj,quad, With Preservative 08/31/2018   Influenza-Unspecified 08/24/2015, 08/18/2016, 08/23/2018   Pneumococcal Conjugate-13 09/14/2015   Pneumococcal Polysaccharide-23 08/19/2013, 08/16/2019   Tdap 10/05/2010, 09/16/2020   Zoster, Live 04/08/2011    TDAP status: Up to date  Flu Vaccine status: Due, Education has been provided regarding the importance of this vaccine. Advised may receive this vaccine at local pharmacy or Health Dept. Aware to provide a copy of the vaccination record if obtained from local pharmacy or Health Dept. Verbalized acceptance and understanding.  Pneumococcal vaccine status: Up to date  Covid-19 vaccine status: Declined, Education has been provided regarding the importance of this vaccine but patient still declined. Advised may receive this vaccine at local pharmacy  or Health Dept.or vaccine clinic. Aware to provide a copy of the vaccination record if obtained from local pharmacy or Health Dept. Verbalized acceptance and understanding.  Qualifies for Shingles Vaccine? Yes   Zostavax completed Yes   Shingrix Completed?: No.    Education has been provided regarding the importance of this vaccine. Patient has been advised to call insurance company to determine out of pocket expense if they have not yet received this vaccine. Advised may also receive vaccine at local pharmacy or Health Dept. Verbalized acceptance and understanding.  Screening Tests Health Maintenance  Topic Date Due   COVID-19 Vaccine (1) Never done   Zoster Vaccines- Shingrix (1 of 2) Never done   INFLUENZA VACCINE  02/04/2022 (Originally 06/07/2021)   MAMMOGRAM  04/08/2022   COLONOSCOPY (Pts 45-7yrs Insurance coverage will need to be confirmed)  07/15/2026   TETANUS/TDAP  09/16/2030   Pneumonia Vaccine 68+ Years old  Completed   DEXA SCAN  Completed   Hepatitis C Screening  Completed   HPV VACCINES  Aged Out    Health Maintenance  Health Maintenance Due  Topic Date Due   COVID-19 Vaccine (1) Never done   Zoster Vaccines- Shingrix (1 of 2) Never done    Colorectal cancer screening: Type of screening: Colonoscopy. Completed 07/15/16. Repeat every 10 years  Mammogram status: Completed 04/08/21. Repeat every year  Bone Density status: Completed 08/21/13. Results reflect: Bone density results: OSTEOPENIA. Repeat every 2 years. Ordered today  Lung Cancer Screening: (Low Dose CT Chest recommended if Age 72-80 years, 30 pack-year currently smoking OR have quit w/in 15years.) does not qualify.  Additional Screening:  Hepatitis C Screening: does qualify; Completed 03/20/13  Vision Screening: Recommended annual ophthalmology exams for early detection of glaucoma and other disorders of the eye. Is the patient up to date with their annual eye exam?  Yes  Who is the provider or what is the  name of the office in which the patient attends annual eye exams? [facility].   Dental Screening: Recommended annual dental exams for proper oral hygiene  Community Resource Referral / Chronic Care Management: CRR required this visit?  No   CCM required this visit?  No      Plan:     I have personally reviewed and noted the following in the patients chart:   Medical and social history Use of alcohol, tobacco or illicit drugs  Current medications and supplements including opioid prescriptions.  Functional ability and status Nutritional status Physical activity Advanced directives List of other physicians Hospitalizations, surgeries, and ER visits in previous 12 months Vitals Screenings to include cognitive, depression, and falls Referrals and appointments  In addition, I have reviewed and discussed with patient certain preventive protocols, quality metrics, and best practice recommendations. A written personalized care plan for preventive services as well as general preventive health recommendations were provided to patient.     Clemetine Marker, LPN   [id number]   Nurse Notes: none

## 2021-11-25 DIAGNOSIS — H6123 Impacted cerumen, bilateral: Secondary | ICD-10-CM | POA: Diagnosis not present

## 2021-11-25 DIAGNOSIS — H900 Conductive hearing loss, bilateral: Secondary | ICD-10-CM | POA: Diagnosis not present

## 2021-12-08 ENCOUNTER — Other Ambulatory Visit: Payer: Self-pay | Admitting: Family Medicine

## 2021-12-08 DIAGNOSIS — E039 Hypothyroidism, unspecified: Secondary | ICD-10-CM

## 2021-12-08 MED ORDER — LEVOTHYROXINE SODIUM 75 MCG PO TABS: 75.0000 ug | ORAL_TABLET | Freq: Every day | ORAL | 0 refills | Status: DC

## 2021-12-08 NOTE — Telephone Encounter (Signed)
Requested Prescriptions  Pending Prescriptions Disp Refills   levothyroxine (SYNTHROID) 75 MCG tablet 90 tablet 0    Sig: Take 1 tablet (75 mcg total) by mouth daily.     Endocrinology:  Hypothyroid Agents Passed - 12/08/2021  2:30 PM      Passed - TSH in normal range and within 360 days    TSH  Date Value Ref Range Status  09/17/2021 0.53 0.40 - 4.50 mIU/L Final         Passed - Valid encounter within last 12 months    Recent Outpatient Visits          2 months ago Psoriatic arthritis [facility])   [facility] Steele Sizer, MD   1 year ago Psoriatic arthritis [facility])   [facility] Steele Sizer, MD   1 year ago Psoriatic arthritis [id number])   [facility] Steele Sizer, MD   2 years ago Acquired hypothyroidism   [facility] Delsa Grana, PA-C   2 years ago [facility] Delsa Grana, PA-C      Future Appointments            In 3 months Ancil Boozer, Drue Stager, MD [facility], [place]   In 10 months  [facility], [facility]

## 2021-12-08 NOTE — Telephone Encounter (Signed)
Copied from [place] [phone]. Topic: Quick Communication - Rx Refill/Question >> Dec 08, 2021 10:16 AM Tessa Lerner A wrote: Medication: levothyroxine (SYNTHROID) 75 MCG tablet [[id number]]   Has the patient contacted their pharmacy? Yes.  The patient has been directed to contact their PCP (Agent: If no, request that the patient contact the pharmacy for the refill. If patient does not wish to contact the pharmacy document the reason why and proceed with request.) (Agent: If yes, when and what did the pharmacy advise?)  Preferred Pharmacy (with phone number or street name): [place], [place] [address] Phone: [phone] Fax: [phone]   Has the patient been seen for an appointment in the last year OR does the patient have an upcoming appointment? Yes.    Agent: Please be advised that RX refills may take up to 3 business days. We ask that you follow-up with your pharmacy.

## 2022-01-26 DIAGNOSIS — H35353 Cystoid macular degeneration, bilateral: Secondary | ICD-10-CM | POA: Diagnosis not present

## 2022-02-02 DIAGNOSIS — H35353 Cystoid macular degeneration, bilateral: Secondary | ICD-10-CM | POA: Diagnosis not present

## 2022-02-09 DIAGNOSIS — H35353 Cystoid macular degeneration, bilateral: Secondary | ICD-10-CM | POA: Diagnosis not present

## 2022-02-25 DIAGNOSIS — H35353 Cystoid macular degeneration, bilateral: Secondary | ICD-10-CM | POA: Diagnosis not present

## 2022-03-14 ENCOUNTER — Other Ambulatory Visit: Payer: Self-pay | Admitting: Family Medicine

## 2022-03-14 DIAGNOSIS — E039 Hypothyroidism, unspecified: Secondary | ICD-10-CM

## 2022-03-17 NOTE — Progress Notes (Signed)
Name: Nicole Contreras   MRN: [id number]    DOB: [birth year]   Date:03/18/2022 ? ?     Progress Note ? ?Subjective ? ?Chief Complaint ? ?Follow Up ? ?HPI ? ?Psoriatic arthritis:  She had dysuria with Sulfasalazine, she was on Enbrel but she stopped because she felt it always makes her sick, Dr. Jefm Bryant advised Humira ut not interested. She took Methotrexate and she was worried about infections. She stopped taking it in 2021 She went to   [facility] and had fluid drained from her right knee last year, she continues to have effusion, states knee pain usually posterior and feels stiff when she first stands up. Dr. Jefm Bryant had discussed Priscille Kluver but she never read about, discussed with her and she is willing to try it  ?  ?Psoriasis: she sees Dr. Kellie Moor. She is using topical medication and worse is on her elbows. Also uses a solution/clobetasol  on her scalp and she states symptoms are controlled . ?  ?Hypothyroidism: taking medication daily without food, no palpitation or dysphagia, denies change in bowel movements or hair loss. Same does for a long time and TSH has been at goal  ?  ?History of breast cancer: she had right mastectomy in 1985, had local recurrence in 1986 had recurrence on her skin and had to have chemo and radiation. She never had re-constructive surgery at the time because of the chemo and radiation, she wears a prosthesis. Up to date with mammograms of left breast - done 04/2021 we will place an order for repeat study  ? ?Senile purpura; both arms.Table and reassurance given ? ?Patient Active Problem List  ? Diagnosis Date Noted  ? Bilateral primary osteoarthritis of knee 10/07/2021  ? History of mastectomy, right 11/27/2018  ? Internal hemorrhoids 12/01/2016  ? Benign neoplasm of transverse colon   ? Numerous moles 11/30/2015  ? Gastro-esophageal reflux disease without esophagitis 09/14/2015  ? Acquired hypothyroidism 09/14/2015  ? Menopause 09/14/2015  ? Hemorrhage, postmenopausal 09/14/2015  ?  Vitamin D deficiency 09/14/2015  ? History of breast cancer 09/14/2015  ? Psoriatic arthritis ([place]) 05/22/2014  ? ? ?Past Surgical History:  ?Procedure Laterality Date  ? BREAST EXCISIONAL BIOPSY Left 1990  ? X 2- neg  ? CARDIAC CATHETERIZATION  90's  ? [facility]; no stent  ? CATARACT EXTRACTION W/PHACO Right 02/04/2016  ? Procedure: CATARACT EXTRACTION PHACO AND INTRAOCULAR LENS PLACEMENT (IOC);  Surgeon: Birder Robson, MD;  Location: [facility];  Service: Ophthalmology;  Laterality: Right;  [place] 00:44 ?AP% 19.2 ?CDE 8.49 ?fluid pack lot # [id number]  ? CATARACT EXTRACTION W/PHACO Left 06/12/2018  ? Procedure: CATARACT EXTRACTION PHACO AND INTRAOCULAR LENS PLACEMENT (IOC);  Surgeon: Birder Robson, MD;  Location: [facility];  Service: Ophthalmology;  Laterality: Left;  [place] 00:56.8 ?AP% 15.8 ?CDE 8.95 ?Fluid Pack lot # [id number]  ? colonoscopy    ? COLONOSCOPY WITH PROPOFOL N/A 07/15/2016  ? Procedure: COLONOSCOPY WITH PROPOFOL;  Surgeon: [name], MD;  Location: [place];  Service: Endoscopy;  Laterality: N/A;  ? ENDOMETRIAL BIOPSY    ? MASTECTOMY Right 1985  ? POLYPECTOMY N/A 07/15/2016  ? Procedure: POLYPECTOMY;  Surgeon: [name], MD;  Location: [place];  Service: Endoscopy;  Laterality: N/A;  ? ? ?Family History  ?Problem Relation Age of Onset  ? Uterine cancer Mother   ? Lung cancer Mother   ? Stroke Mother   ? Hypertension Mother   ? Dementia Mother   ? Heart disease Father   ?  Arrhythmia Father   ?     afib  ? Cervical cancer Sister   ? Arrhythmia Sister   ?     afib  ? Breast cancer Sister 5  ? Breast cancer Paternal Aunt 73  ? ? ?Social History  ? ?Tobacco Use  ? Smoking status: Former  ?  Packs/day: 0.25  ?  Years: 10.00  ?  Pack years: 2.50  ?  Types: Cigarettes  ?  Quit date: 11/07/1990  ?  Years since quitting: 31.3  ? Smokeless tobacco: Never  ? Tobacco comments:  ?  smoking cessation materials not required  ?Substance Use Topics  ? Alcohol use: No  ?  Alcohol/week: 0.0 standard drinks   ? ? ? ?Current Outpatient Medications:  ?  cholecalciferol (VITAMIN D) 1000 units tablet, Take 1,000 Units by mouth daily. , Disp: , Rfl:  ?  Clobetasol Propionate (TEMOVATE) 0.05 % external spray, Apply topically 2 (two) times daily. (Patient taking differently: Apply 1 application. topically 2 (two) times daily as needed (for psoriasis).), Disp: 59 mL, Rfl: 0 ?  levothyroxine (SYNTHROID) 75 MCG tablet, Take 1 tablet (75 mcg total) by mouth daily., Disp: 30 tablet, Rfl: 0 ?  Multiple Vitamin (MULTIVITAMIN) tablet, Take 1 tablet by mouth daily., Disp: , Rfl:  ?  zinc gluconate 50 MG tablet, Take 50 mg by mouth daily., Disp: , Rfl:  ?  nabumetone (RELAFEN) 500 MG tablet, Take 1 tablet (500 mg total) by mouth 2 (two) times daily as needed. (Patient not taking: Reported on 03/18/2022), Disp: 60 tablet, Rfl: 3 ? ?Allergies  ?Allergen Reactions  ? Sulfa Antibiotics Rash  ? ? ?I personally reviewed active problem list, medication list, allergies, family history, social history, health maintenance with the patient/caregiver today. ? ? ?ROS ? ?Constitutional: Negative for fever or weight change.  ?Respiratory: Negative for cough and shortness of breath.   ?Cardiovascular: Negative for chest pain or palpitations.  ?Gastrointestinal: Negative for abdominal pain, no bowel changes.  ?Musculoskeletal: positive for gait problem and right knee  joint swelling.  ?Skin:positive  for rash.  ?Neurological: Negative for dizziness or headache.  ?No other specific complaints in a complete review of systems (except as listed in HPI above).  ? ?Objective ? ?Vitals:  ? 03/18/22 1331  ?BP: 132/76  ?Pulse: 90  ?Resp: 16  ?SpO2: 98%  ?Weight: 187 lb (84.8 kg)  ?Height: '5\' 7"'$  (1.702 m)  ? ? ?Body mass index is 29.29 kg/m?. ? ?Physical Exam ? ?Constitutional: Patient appears well-developed and well-nourished.  No distress.  ?HEENT: head atraumatic, normocephalic, pupils equal and reactive to light, neck supple ?Cardiovascular: Normal rate,  regular rhythm and normal heart sounds.  No murmur heard. No BLE edema. ?Pulmonary/Chest: Effort normal and breath sounds normal. No respiratory distress. ?Abdominal: Soft.  There is no tenderness. ?Muscular Skeletal: crepitus with extension of right knee  ?Psychiatric: Patient has a normal mood and affect. behavior is normal. Judgment and thought content normal.  ? ?PHQ2/9: ? ?  03/18/2022  ?  1:30 PM 10/26/2021  ?  3:07 PM 09/17/2021  ?  2:23 PM 10/22/2020  ?  2:21 PM 09/16/2020  ?  3:15 PM  ?Depression screen PHQ 2/9  ?Decreased Interest 0 0 0 0 0  ?Down, Depressed, Hopeless 0 0 0 0 0  ?PHQ - 2 Score 0 0 0 0 0  ?Altered sleeping 0 0 0    ?Tired, decreased energy 0 0 0    ?Change in appetite 0 0  0    ?Feeling bad or failure about yourself  0 0 0    ?Trouble concentrating 0 0 0    ?Moving slowly or fidgety/restless 0 0 0    ?Suicidal thoughts 0 0 0    ?PHQ-9 Score 0 0 0    ?Difficult doing work/chores  Not difficult at all Not difficult at all    ?  ?phq 9 is negative ? ? ?Fall Risk: ? ?  03/18/2022  ?  1:30 PM 10/26/2021  ?  3:08 PM 09/17/2021  ?  2:23 PM 10/22/2020  ?  2:22 PM 09/16/2020  ?  3:15 PM  ?Fall Risk   ?Falls in the past year? 0 0 0 0 0  ?Number falls in past yr: 0 0 0 0 0  ?Injury with Fall? 0 0 0 0 0  ?Risk for fall due to : No Fall Risks No Fall Risks No Fall Risks No Fall Risks   ?Follow up Falls prevention discussed Falls prevention discussed Falls prevention discussed Falls prevention discussed   ? ? ? ? ?Functional Status Survey: ?Is the patient deaf or have difficulty hearing?: No ?Does the patient have difficulty seeing, even when wearing glasses/contacts?: No ?Does the patient have difficulty concentrating, remembering, or making decisions?: No ?Does the patient have difficulty walking or climbing stairs?: No ?Does the patient have difficulty dressing or bathing?: No ?Does the patient have difficulty doing errands alone such as visiting a doctor's office or shopping?: No ? ? ? ?Assessment &  Plan ? ?Problem List Items Addressed This Visit   ? ? Acquired hypothyroidism  ?  Recheck TSH ? ?  ?  ? Relevant Medications  ? levothyroxine (SYNTHROID) 75 MCG tablet  ? Other Relevant Orders  ? TSH  ? Psoriatic a

## 2022-03-18 ENCOUNTER — Encounter: Payer: Self-pay | Admitting: Family Medicine

## 2022-03-18 ENCOUNTER — Ambulatory Visit: Payer: Medicare PPO | Admitting: Family Medicine

## 2022-03-18 VITALS — BP 132/76 | HR 90 | Resp 16 | Ht 67.0 in | Wt 187.0 lb

## 2022-03-18 DIAGNOSIS — L409 Psoriasis, unspecified: Secondary | ICD-10-CM

## 2022-03-18 DIAGNOSIS — G8929 Other chronic pain: Secondary | ICD-10-CM

## 2022-03-18 DIAGNOSIS — E559 Vitamin D deficiency, unspecified: Secondary | ICD-10-CM | POA: Diagnosis not present

## 2022-03-18 DIAGNOSIS — M25561 Pain in right knee: Secondary | ICD-10-CM

## 2022-03-18 DIAGNOSIS — Z9011 Acquired absence of right breast and nipple: Secondary | ICD-10-CM | POA: Diagnosis not present

## 2022-03-18 DIAGNOSIS — R0981 Nasal congestion: Secondary | ICD-10-CM | POA: Diagnosis not present

## 2022-03-18 DIAGNOSIS — D692 Other nonthrombocytopenic purpura: Secondary | ICD-10-CM | POA: Insufficient documentation

## 2022-03-18 DIAGNOSIS — L4050 Arthropathic psoriasis, unspecified: Secondary | ICD-10-CM

## 2022-03-18 DIAGNOSIS — Z79899 Other long term (current) drug therapy: Secondary | ICD-10-CM | POA: Diagnosis not present

## 2022-03-18 DIAGNOSIS — Z780 Asymptomatic menopausal state: Secondary | ICD-10-CM

## 2022-03-18 DIAGNOSIS — E039 Hypothyroidism, unspecified: Secondary | ICD-10-CM

## 2022-03-18 DIAGNOSIS — M8580 Other specified disorders of bone density and structure, unspecified site: Secondary | ICD-10-CM

## 2022-03-18 MED ORDER — FLUTICASONE PROPIONATE 50 MCG/ACT NA SUSP: 2.0000 | Freq: Every day | NASAL | 2 refills | Status: DC

## 2022-03-18 MED ORDER — LORATADINE 10 MG PO TABS: 10.0000 mg | ORAL_TABLET | Freq: Every day | ORAL | 1 refills | Status: AC

## 2022-03-18 MED ORDER — LEVOTHYROXINE SODIUM 75 MCG PO TABS: 75.0000 ug | ORAL_TABLET | Freq: Every day | ORAL | 1 refills | Status: DC

## 2022-03-18 MED ORDER — APREMILAST 10 & 20 & 30 MG PO TBPK: 1.0000 | ORAL_TABLET | ORAL | 0 refills | Status: DC

## 2022-03-18 MED ORDER — OTEZLA 30 MG PO TABS: 1.0000 | ORAL_TABLET | Freq: Two times a day (BID) | ORAL | 4 refills | Status: DC

## 2022-03-18 NOTE — Assessment & Plan Note (Signed)
Used to see Dr. Jefm Bryant ?Stopped medications to either side effects of fear of side effects ?Willing to try Ortezla ? ?

## 2022-03-18 NOTE — Assessment & Plan Note (Signed)
Reassurance given.  

## 2022-03-18 NOTE — Assessment & Plan Note (Addendum)
Not interested in seeing Ortho at this time ?

## 2022-03-18 NOTE — Assessment & Plan Note (Signed)
Recheck TSH 

## 2022-03-18 NOTE — Assessment & Plan Note (Signed)
She has nasal congestion during each spring and it causes eye irritation, we will give rx to start next March  ?

## 2022-03-19 LAB — COMPLETE METABOLIC PANEL WITH GFR
AG Ratio: 2 (calc) (ref 1.0–2.5)
ALT: 15 U/L (ref 6–29)
AST: 20 U/L (ref 10–35)
Albumin: 4.4 g/dL (ref 3.6–5.1)
Alkaline phosphatase (APISO): 104 U/L (ref 37–153)
BUN: 21 mg/dL (ref 7–25)
CO2: 28 mmol/L (ref 20–32)
Calcium: 9.2 mg/dL (ref 8.6–10.4)
Chloride: 103 mmol/L (ref 98–110)
Creat: 0.76 mg/dL (ref 0.60–1.00)
Globulin: 2.2 g/dL (calc) (ref 1.9–3.7)
Glucose, Bld: 102 mg/dL — ABNORMAL HIGH (ref 65–99)
Potassium: 3.8 mmol/L (ref 3.5–5.3)
Sodium: 140 mmol/L (ref 135–146)
Total Bilirubin: 0.3 mg/dL (ref 0.2–1.2)
Total Protein: 6.6 g/dL (ref 6.1–8.1)
eGFR: 83 mL/min/{1.73_m2} (ref >=60)

## 2022-03-19 LAB — VITAMIN D 25 HYDROXY (VIT D DEFICIENCY, FRACTURES): Vit D, 25-Hydroxy: 82 ng/mL (ref 30–100)

## 2022-03-19 LAB — TSH: TSH: 1.18 mIU/L (ref 0.40–4.50)

## 2022-03-28 ENCOUNTER — Encounter: Payer: Self-pay | Admitting: Family Medicine

## 2022-03-31 ENCOUNTER — Other Ambulatory Visit
Admission: RE | Admit: 2022-03-31 | Discharge: 2022-03-31 | Disposition: A | Discharge status: Home / Self Care | Payer: Medicare PPO | Attending: Cardiovascular Disease | Admitting: Cardiovascular Disease

## 2022-03-31 ENCOUNTER — Ambulatory Visit: Payer: Medicare PPO | Admitting: Cardiovascular Disease

## 2022-03-31 ENCOUNTER — Encounter: Payer: Self-pay | Admitting: Cardiovascular Disease

## 2022-03-31 VITALS — BP 148/83 | HR 78 | Ht 67.0 in | Wt 188.4 lb

## 2022-03-31 DIAGNOSIS — R0602 Shortness of breath: Secondary | ICD-10-CM | POA: Insufficient documentation

## 2022-03-31 DIAGNOSIS — R072 Precordial pain: Secondary | ICD-10-CM | POA: Insufficient documentation

## 2022-03-31 LAB — D-DIMER, QUANTITATIVE: D-Dimer, Quant: 0.54 ug/mL-FEU — ABNORMAL HIGH (ref 0.00–0.50)

## 2022-03-31 LAB — TROPONIN I (HIGH SENSITIVITY): Troponin I (High Sensitivity): 11 ng/L (ref <18)

## 2022-03-31 MED ORDER — METOPROLOL TARTRATE 100 MG PO TABS: ORAL_TABLET | ORAL | 0 refills | Status: DC

## 2022-03-31 NOTE — Progress Notes (Signed)
Cardiology Office Note   Date:  03/31/2022   ID:  Nicole Contreras, Nicole Contreras 19-Jul-1946, MRN [id number]  PCP:  Steele Sizer, MD  Cardiologist:   Kathlyn Sacramento, MD   Chief Complaint  Patient presents with   NEW patient- Patient felt dizzy     Patient felt like she was going to pass out.       History of Present Illness: Nicole Contreras is a 76 y.o. female who was referred for evaluation of dizziness and chest tightness. She has known history of hypothyroidism, GERD, breast cancer with extensive radiation and chemotherapy in 1986 and psoriasis.  She is a previous remote smoker and has no family history of premature coronary artery disease.  She was seen in our office in 2015 in 2017 for atypical chest pain.  She had a nuclear stress test in 2017 that showed no evidence of ischemia with normal ejection fraction.  Echocardiogram in 2015 showed normal LV systolic function with mild left ventricular hypertrophy.  Outpatient monitor in 2017 showed occasional PACs and PVCs and short runs of atrial tachycardia. On Saturday, she was doing some housework and started feeling dizzy and lightheaded.  She felt hot and nauseous.  She felt extremely tired and very dizzy to the point of syncope but did not lose consciousness completely.  After that, she felt extremely tired and slept for the most part of the weekend.  She had chest tightness that lasted for 5 to 10 minutes.  By Monday, she started feeling better and gradually resumed her activities.  She is improved not completely back to baseline.  No previous similar episodes.  Past Medical History:  Diagnosis Date   Arthritis    Atypical chest pain    a. 03/2014 ETT: Ex time 4:10, Max HR 160 bpm, no acute st/t changes.   Breast cancer ([place]) 1986   s/p radiation.   Bright's disease    Hx   Diastolic dysfunction    a. 02/2014 Echo: EF 55-60%, no rwma, Gr1 DD.   Dizziness and giddiness    Fecal smearing    GERD (gastroesophageal reflux disease)     Heart murmur    as child   Insomnia, unspecified    Obesity, unspecified    Personal history of chemotherapy    Personal history of radiation therapy 1986   right breast ca   PONV (postoperative nausea and vomiting)    after cataract procedure   Postmenopausal bleeding    Psoriasis    Psoriatic arthropathy ([facility])    Reflux esophagitis    Right cataract    a. 01/2016 s/p cataract surgery.   Spasm of muscle    Unspecified hypothyroidism    Unspecified urinary incontinence    Unspecified vitamin D deficiency     Past Surgical History:  Procedure Laterality Date   BREAST EXCISIONAL BIOPSY Left 1990   X 2- neg   CARDIAC CATHETERIZATION  90's   [facility]; no stent   CATARACT EXTRACTION W/PHACO Right 02/04/2016   Procedure: CATARACT EXTRACTION PHACO AND INTRAOCULAR LENS PLACEMENT (IOC);  Surgeon: Birder Robson, MD;  Location: [facility];  Service: Ophthalmology;  Laterality: Right;  [place] 00:44 AP% 19.2 CDE 8.49 fluid pack lot # [id number]   CATARACT EXTRACTION W/PHACO Left 06/12/2018   Procedure: CATARACT EXTRACTION PHACO AND INTRAOCULAR LENS PLACEMENT (IOC);  Surgeon: Birder Robson, MD;  Location: [facility];  Service: Ophthalmology;  Laterality: Left;  [place] 00:56.8 AP% 15.8 CDE 8.95 Fluid Pack lot # [id number]  colonoscopy     COLONOSCOPY WITH PROPOFOL N/A 07/15/2016   Procedure: COLONOSCOPY WITH PROPOFOL;  Surgeon: [name], MD;  Location: [place];  Service: Endoscopy;  Laterality: N/A;   ENDOMETRIAL BIOPSY     MASTECTOMY Right 1985   POLYPECTOMY N/A 07/15/2016   Procedure: POLYPECTOMY;  Surgeon: [name], MD;  Location: [place];  Service: Endoscopy;  Laterality: N/A;     Current Outpatient Medications  Medication Sig Dispense Refill   cholecalciferol (VITAMIN D) 1000 units tablet Take 1,000 Units by mouth daily.      levothyroxine (SYNTHROID) 75 MCG tablet Take 1 tablet (75 mcg total) by mouth daily. 90 tablet 1   Multiple Vitamin (MULTIVITAMIN) tablet  Take 1 tablet by mouth daily.     zinc gluconate 50 MG tablet Take 50 mg by mouth daily.     Apremilast (OTEZLA) 30 MG TABS Take 1 tablet (30 mg total) by mouth 2 (two) times daily. 60 tablet 4   Apremilast 10 & 20 & 30 MG TBPK Take 1 each by mouth as directed. (Patient not taking: Reported on 03/31/2022) 28 each 0   Clobetasol Propionate (TEMOVATE) 0.05 % external spray Apply topically 2 (two) times daily. (Patient taking differently: Apply 1 application. topically 2 (two) times daily as needed (for psoriasis).) 59 mL 0   fluticasone (FLONASE) 50 MCG/ACT nasal spray Place 2 sprays into both nostrils daily. (Patient not taking: Reported on 03/31/2022) 16 g 2   loratadine (CLARITIN) 10 MG tablet Take 1 tablet (10 mg total) by mouth daily. (Patient not taking: Reported on 03/31/2022) 90 tablet 1   No current facility-administered medications for this visit.    Allergies:   Sulfa antibiotics    Social History:  The patient  reports that she quit smoking about 31 years ago. Her smoking use included cigarettes. She has a 2.50 pack-year smoking history. She has never used smokeless tobacco. She reports that she does not drink alcohol and does not use drugs.   Family History:  The patient's family history includes Arrhythmia in her father and sister; Breast cancer (age of onset: 37) in her paternal aunt and sister; Cancer (age of onset: 60) in her sister; Cervical cancer in her sister; Dementia in her mother; Heart disease in her father; Hypertension in her mother; Lung cancer in her mother; Stroke in her mother; Uterine cancer in her mother.    ROS:  Please see the history of present illness.   Otherwise, review of systems are positive for none.   All other systems are reviewed and negative.    PHYSICAL EXAM: VS:  BP (!) 148/83 (BP Location: Left Arm, Patient Position: Sitting, Cuff Size: Large)   Pulse 78   Ht '5\' 7"'$  (1.702 m)   Wt 188 lb 6.4 oz (85.5 kg)   SpO2 98%   BMI 29.51 kg/m  , BMI Body  mass index is 29.51 kg/m. GEN: Well nourished, well developed, in no acute distress  HEENT: normal  Neck: no JVD, carotid bruits, or masses Cardiac: RRR; no murmurs, rubs, or gallops,no edema  Respiratory:  clear to auscultation bilaterally, normal work of breathing GI: soft, nontender, nondistended, + BS MS: no deformity or atrophy  Skin: warm and dry, no rash Neuro:  Strength and sensation are intact Psych: euthymic mood, full affect   EKG:  EKG is ordered today. The ekg ordered today demonstrates normal sinus rhythm with possible septal infarct.  This seems to be new compared to her prior EKGs.  Recent Labs: 09/27/2021: Hemoglobin 13.2; Platelets 224 03/18/2022: ALT 15; BUN 21; Creat 0.76; Potassium 3.8; Sodium 140; TSH 1.18    Lipid Panel    Component Value Date/Time   CHOL 124 08/16/2019 0000   TRIG 96 08/16/2019 0000   HDL 68 08/16/2019 0000   CHOLHDL 1.8 08/16/2019 0000   LDLCALC 38 08/16/2019 0000      Wt Readings from Last 3 Encounters:  03/31/22 188 lb 6.4 oz (85.5 kg)  03/18/22 187 lb (84.8 kg)  09/17/21 184 lb 8 oz (83.7 kg)          03/31/2022    9:29 AM  PAD Screen  Previous PAD dx? No  Previous surgical procedure? No  Pain with walking? No  Feet/toe relief with dangling? No  Painful, non-healing ulcers? No  Extremities discolored? No      ASSESSMENT AND PLAN:  1.  Chest pain, shortness of breath and dizziness: The episode on Saturday is worrisome for a possible cardiac etiology.  Thus, I am going to send her for a stat troponin and D-dimer.  Her EKG does seem to be slightly different from before with possible septal Q waves.  Assuming that her troponin is normal, the plan is to proceed with cardiac CTA.  She is not allergic to contrast.  In addition, we will obtain an echocardiogram.  2.  Previous palpitations: Previous monitor showed rare PACs and PVCs.  I doubt that her recent episode is related to an arrhythmia.   Disposition:   FU after  cardiac testing.  Signed,  Kathlyn Sacramento, MD  03/31/2022 9:45 AM    [place]

## 2022-03-31 NOTE — Patient Instructions (Addendum)
Medication Instructions:  Your physician recommends that you continue on your current medications as directed. Please refer to the Current Medication list given to you today.  *If you need a refill on your cardiac medications before your next appointment, please call your pharmacy*   Lab Work: STAT Troponin and D-Dimer today.  Please have your labs drawn at the [facility]. Stop at the Registration desk to check in.  If you have labs (blood work) drawn today and your tests are completely normal, you will receive your results only by: [place] (if you have MyChart) OR A paper copy in the mail If you have any lab test that is abnormal or we need to change your treatment, we will call you to review the results.   Testing/Procedures: Your physician has requested that you have an echocardiogram. Echocardiography is a painless test that uses sound waves to create images of your heart. It provides your doctor with information about the size and shape of your heart and how well your heart's chambers and valves are working. This procedure takes approximately one hour. There are no restrictions for this procedure.  Your physician has requested that you have cardiac CT. Cardiac computed tomography (CT) is a painless test that uses an x-ray machine to take clear, detailed pictures of your heart. For further information please visit [email or web address]. Please follow instruction sheet as given.     Follow-Up: At [facility], you and your health needs are our priority.  As part of our continuing mission to provide you with exceptional heart care, we have created designated Provider Care Teams.  These Care Teams include your primary Cardiologist (physician) and Advanced Practice Providers (APPs -  Physician Assistants and Nurse Practitioners) who all work together to provide you with the care you need, when you need it.  We recommend signing up for the patient portal called "MyChart".   Sign up information is provided on this After Visit Summary.  MyChart is used to connect with patients for Virtual Visits (Telemedicine).  Patients are able to view lab/test results, encounter notes, upcoming appointments, etc.  Non-urgent messages can be sent to your provider as well.   To learn more about what you can do with MyChart, go to [email or web address].    Your next appointment:   Follow up after testing  The format for your next appointment:   In Person  Provider:   You may see Kathlyn Sacramento, MD or one of the following Advanced Practice Providers on your designated Care Team:   Murray Hodgkins, NP Christell Faith, PA-C Cadence Kathlen Mody, PA-C{     Other Instructions     Your cardiac CT will be scheduled at one of the below location:    [facility] [address] [place], [place] [address] [phone]  If scheduled at [facility], please arrive 15 mins early for check-in and test prep.  Please follow these instructions carefully (unless otherwise directed):   On the Night Before the Test: Be sure to Drink plenty of water. Do not consume any caffeinated/decaffeinated beverages or chocolate 12 hours prior to your test. Do not take any antihistamines 12 hours prior to your test.  On the Day of the Test: Drink plenty of water until 1 hour prior to the test. Do not eat any food 4 hours prior to the test. You may take your regular medications prior to the test.         After the Test:  Drink plenty of water. After receiving IV contrast, you may experience a mild flushed feeling. This is normal. On occasion, you may experience a mild rash up to 24 hours after the test. This is not dangerous. If this occurs, you can take Benadryl 25 mg and increase your fluid intake. If you experience trouble breathing, this can be serious. If it is severe call 911 IMMEDIATELY. If it is mild, please call our  office. If you take any of these medications: Glipizide/Metformin, Avandament, Glucavance, please do not take 48 hours after completing test unless otherwise instructed.  We will call to schedule your test 2-4 weeks out understanding that some insurance companies will need an authorization prior to the service being performed.   For non-scheduling related questions, please contact the cardiac imaging nurse navigator should you have any questions/concerns: Marchia Bond, Cardiac Imaging Nurse Navigator Gordy Clement, Cardiac Imaging Nurse Navigator [facility] Direct Office Dial: [phone]   For scheduling needs, including cancellations and rescheduling, please call [place], [phone].   Important Information About Sugar

## 2022-04-06 ENCOUNTER — Telehealth (HOSPITAL_COMMUNITY): Payer: Self-pay | Admitting: Emergency Medicine

## 2022-04-06 NOTE — Telephone Encounter (Signed)
Reaching out to patient to offer assistance regarding upcoming cardiac imaging study; pt verbalizes understanding of appt date/time, parking situation and where to check in, pre-test NPO status and medications ordered, and verified current allergies; name and call back number provided for further questions should they arise Taren Toops RN Navigator Cardiac Imaging [facility] [phone] office [phone] cell 

## 2022-04-07 ENCOUNTER — Ambulatory Visit
Admission: RE | Admit: 2022-04-07 | Discharge: 2022-04-07 | Disposition: A | Discharge status: Home / Self Care | Payer: Medicare PPO | Source: Ambulatory Visit | Attending: Cardiovascular Disease | Admitting: Cardiovascular Disease

## 2022-04-07 DIAGNOSIS — R072 Precordial pain: Secondary | ICD-10-CM | POA: Insufficient documentation

## 2022-04-07 DIAGNOSIS — R0602 Shortness of breath: Secondary | ICD-10-CM | POA: Insufficient documentation

## 2022-04-07 MED ORDER — METOPROLOL TARTRATE 5 MG/5ML IV SOLN
10.0000 mg | Freq: Once | INTRAVENOUS | Status: AC
  Administered 2022-04-07: 10 mg via INTRAVENOUS

## 2022-04-07 MED ORDER — IOHEXOL 350 MG/ML SOLN
75.0000 mL | Freq: Once | INTRAVENOUS | Status: AC | PRN
  Administered 2022-04-07: 75 mL via INTRAVENOUS

## 2022-04-07 MED ORDER — NITROGLYCERIN 0.4 MG SL SUBL
0.8000 mg | SUBLINGUAL_TABLET | Freq: Once | SUBLINGUAL | Status: AC
  Administered 2022-04-07: 0.8 mg via SUBLINGUAL

## 2022-04-07 NOTE — Progress Notes (Signed)
Patient tolerated procedure well. Ambulate w/o difficulty. Denies light headedness or being dizzy. Sitting in chair drinking water provided. Encouraged to drink extra water today and reasoning explained. Verbalized understanding. All questions answered. ABC intact. No further needs. Discharge from procedure area w/o issues.   °

## 2022-04-11 ENCOUNTER — Inpatient Hospital Stay: Admission: RE | Admit: 2022-04-11 | Payer: Medicare PPO | Source: Ambulatory Visit

## 2022-04-18 ENCOUNTER — Telehealth: Payer: Self-pay | Admitting: *Deleted

## 2022-04-18 ENCOUNTER — Ambulatory Visit (INDEPENDENT_AMBULATORY_CARE_PROVIDER_SITE_OTHER): Payer: Medicare PPO

## 2022-04-18 DIAGNOSIS — R072 Precordial pain: Secondary | ICD-10-CM

## 2022-04-18 DIAGNOSIS — R0602 Shortness of breath: Secondary | ICD-10-CM | POA: Diagnosis not present

## 2022-04-18 DIAGNOSIS — R911 Solitary pulmonary nodule: Secondary | ICD-10-CM

## 2022-04-18 LAB — ECHOCARDIOGRAM COMPLETE
AR max vel: 2.23 cm2
AV Area VTI: 2.29 cm2
AV Area mean vel: 2.27 cm2
AV Mean grad: 3 mmHg
AV Peak grad: 6.5 mmHg
Ao pk vel: 1.27 m/s
Area-P 1/2: 3.13 cm2
S' Lateral: 3.2 cm
Single Plane A4C EF: 55.7 %

## 2022-04-18 NOTE — Telephone Encounter (Signed)
Left voicemail message to call back for review of results.  

## 2022-04-18 NOTE — Telephone Encounter (Signed)
-----   Message from Wellington Hampshire, MD sent at 04/13/2022  5:29 PM EDT ----- Inform patient that cardiac CTA showed mild plaque with no evidence of obstructive disease.  Overall good news.  Follow-up as planned. The CT scan of the lungs did show an incidental finding of left lower lobe pulmonary nodule.  I recommend referral to pulmonary for evaluation of this.

## 2022-04-20 NOTE — Telephone Encounter (Signed)
Patient made aware of Cardiac CTA results and Dr. Tyrell Antonio recommendation with verbalized understanding. Referral placed to Pulmonology. Adv the patient that their office will call her to schedule. Patient will f/u with Dr. Fletcher Anon as planned.

## 2022-04-29 ENCOUNTER — Ambulatory Visit: Payer: Medicare PPO | Admitting: Cardiovascular Disease

## 2022-04-29 ENCOUNTER — Encounter: Payer: Self-pay | Admitting: Cardiovascular Disease

## 2022-04-29 VITALS — BP 138/70 | HR 88 | Ht 67.0 in | Wt 187.5 lb

## 2022-04-29 DIAGNOSIS — R911 Solitary pulmonary nodule: Secondary | ICD-10-CM

## 2022-04-29 DIAGNOSIS — R072 Precordial pain: Secondary | ICD-10-CM

## 2022-05-17 ENCOUNTER — Encounter: Payer: Self-pay | Admitting: Pulmonary Disease

## 2022-05-17 ENCOUNTER — Ambulatory Visit: Payer: Medicare PPO | Admitting: Pulmonary Disease

## 2022-05-17 VITALS — BP 136/80 | HR 81 | Temp 98.3°F | Ht 67.0 in | Wt 187.2 lb

## 2022-05-17 DIAGNOSIS — R911 Solitary pulmonary nodule: Secondary | ICD-10-CM

## 2022-05-17 NOTE — Patient Instructions (Signed)
We are going to get a follow-up CT scan of the chest around December time.  We will see her in follow-up after that CT scan of the chest is done.

## 2022-05-17 NOTE — Progress Notes (Signed)
Subjective:    Patient ID: Nicole Contreras, female    DOB: 1946-01-31, 76 y.o.   MRN: [id number] Patient Care Team: Steele Sizer, MD as PCP - General (Family Medicine) Emmaline Kluver., MD as Consulting Physician (Rheumatology) Oneta Rack, MD as Consulting Physician (Dermatology) Eunice Blase, MD as Anesthesiologist (Orthodontics)  Chief Complaint  Patient presents with   pulmonary consult    Recent CT--no current sx.     HPI This is a 76 year old remote former smoker with minimal smoking history, and history as noted below, who presents for evaluation of a lung nodule noted incidentally on cardiac screening CT.  She is kindly referred by Dr. Kathlyn [place].  Her primary care physician is Dr. Steele Sizer.  Patient has not had any symptoms with regards to this nodule.  She was being evaluated for shortness of breath therefore the study was done.  Since that study she has noted that her shortness of breath has abated.  She is status post right mastectomy in 1986 for breast cancer.  She has not had any evidence of recurrence since then.  With smoking cigarettes altogether in 1992 with a 10-pack-year history total.  She does not endorse any fevers, chills or sweats, no cough or sputum production.  No chest pain, orthopnea or paroxysmal nocturnal dyspnea.  Has no lower extremity edema or calf tenderness.  She does have a history of psoriatic arthritis not on any remittive agents.  Is not endorse any other symptomatology.   Review of Systems A 10 point review of systems was performed and it is as noted above otherwise negative.  Past Medical History:  Diagnosis Date   Arthritis    Atypical chest pain    a. 03/2014 ETT: Ex time 4:10, Max HR 160 bpm, no acute st/t changes.   Breast cancer ([place]) 1986   s/p radiation.   Bright's disease    Hx   Diastolic dysfunction    a. 02/2014 Echo: EF 55-60%, no rwma, Gr1 DD.   Dizziness and giddiness    Fecal smearing    GERD  (gastroesophageal reflux disease)    Heart murmur    as child   Insomnia, unspecified    Obesity, unspecified    Personal history of chemotherapy    Personal history of radiation therapy 1986   right breast ca   PONV (postoperative nausea and vomiting)    after cataract procedure   Postmenopausal bleeding    Psoriasis    Psoriatic arthropathy (HCC)    Reflux esophagitis    Right cataract    a. 01/2016 s/p cataract surgery.   Spasm of muscle    Unspecified hypothyroidism    Unspecified urinary incontinence    Unspecified vitamin D deficiency    Past Surgical History:  Procedure Laterality Date   BREAST EXCISIONAL BIOPSY Left 1990   X 2- neg   CARDIAC CATHETERIZATION  [facility]   [facility]; no stent   CATARACT EXTRACTION W/PHACO Right 02/04/2016   Procedure: CATARACT EXTRACTION PHACO AND INTRAOCULAR LENS PLACEMENT (IOC);  Surgeon: Birder Robson, MD;  Location: [facility];  Service: Ophthalmology;  Laterality: Right;  [place] 00:44 AP% 19.2 CDE 8.49 fluid pack lot # [id number]   CATARACT EXTRACTION W/PHACO Left 06/12/2018   Procedure: CATARACT EXTRACTION PHACO AND INTRAOCULAR LENS PLACEMENT (IOC);  Surgeon: Birder Robson, MD;  Location: [facility];  Service: Ophthalmology;  Laterality: Left;  [place] 00:56.8 AP% 15.8 CDE 8.95 Fluid Pack lot # [id number]   colonoscopy  COLONOSCOPY WITH PROPOFOL N/A 07/15/2016   Procedure: COLONOSCOPY WITH PROPOFOL;  Surgeon: [name], MD;  Location: [place];  Service: Endoscopy;  Laterality: N/A;   ENDOMETRIAL BIOPSY     MASTECTOMY Right 1985   POLYPECTOMY N/A 07/15/2016   Procedure: POLYPECTOMY;  Surgeon: [name], MD;  Location: [place];  Service: Endoscopy;  Laterality: N/A;   Patient Active Problem List   Diagnosis Date Noted   Nasal congestion 03/18/2022   Psoriasis of scalp 03/18/2022   Osteopenia after menopause 03/18/2022   Psoriasis 03/18/2022   Senile purpura ([place]) 03/18/2022   Chronic pain of right knee 03/18/2022    Bilateral primary osteoarthritis of knee 10/07/2021   History of mastectomy, right 11/27/2018   Internal hemorrhoids 12/01/2016   Benign neoplasm of transverse colon    Numerous moles 11/30/2015   Gastro-esophageal reflux disease without esophagitis 09/14/2015   Acquired hypothyroidism 09/14/2015   Menopause 09/14/2015   Hemorrhage, postmenopausal 09/14/2015   Vitamin D deficiency 09/14/2015   History of breast cancer 09/14/2015   Psoriatic arthritis ([place]) 05/22/2014   Family History  Problem Relation Age of Onset   Uterine cancer Mother    Lung cancer Mother    Stroke Mother    Hypertension Mother    Dementia Mother    Heart disease Father    Arrhythmia Father        afib   Cancer Sister 23       thyroid cancer   Cervical cancer Sister    Arrhythmia Sister        afib   Breast cancer Sister 78   Breast cancer Paternal Aunt 4   Social History   Tobacco Use   Smoking status: Former    Packs/day: 1.00    Years: 10.00    Total pack years: 10.00    Types: Cigarettes    Quit date: 11/07/1990    Years since quitting: 31.5   Smokeless tobacco: Never   Tobacco comments:    smoking cessation materials not required  Substance Use Topics   Alcohol use: No    Alcohol/week: 0.0 standard drinks of alcohol   Allergies  Allergen Reactions   Sulfa Antibiotics Rash   Current Meds  Medication Sig   cholecalciferol (VITAMIN D) 1000 units tablet Take 1,000 Units by mouth daily.    Clobetasol Propionate (TEMOVATE) 0.05 % external spray Apply topically 2 (two) times daily. (Patient taking differently: Apply 1 application  topically 2 (two) times daily as needed (for psoriasis).)   fluticasone (FLONASE) 50 MCG/ACT nasal spray Place 2 sprays into both nostrils daily.   levothyroxine (SYNTHROID) 75 MCG tablet Take 1 tablet (75 mcg total) by mouth daily.   loratadine (CLARITIN) 10 MG tablet Take 1 tablet (10 mg total) by mouth daily.   Multiple Vitamin (MULTIVITAMIN) tablet Take 1  tablet by mouth daily.   zinc gluconate 50 MG tablet Take 50 mg by mouth daily.   Immunization History  Administered Date(s) Administered   Fluad Quad(high Dose 65+) 08/16/2019   Influenza, High Dose Seasonal PF 08/08/2017   Influenza, Seasonal, Injecte, Preservative Fre 08/16/2012, 08/07/2013   Influenza,inj,Quad PF,6+ Mos 08/04/2014   Influenza,inj,quad, With Preservative 08/31/2018   Influenza-Unspecified 08/24/2015, 08/18/2016, 08/23/2018   Pneumococcal Conjugate-13 09/14/2015   Pneumococcal Polysaccharide-23 08/19/2013, 08/16/2019   Tdap 10/05/2010, 09/16/2020   Zoster, Live 04/08/2011       Objective:   Physical Exam BP 136/80 (BP Location: Left Arm, Cuff Size: Normal)   Pulse 81  Temp 98.3 F (36.8 C) (Temporal)   Ht '5\' 7"'$  (1.702 m)   Wt 187 lb 3.2 oz (84.9 kg)   SpO2 95%   BMI 29.32 kg/m  GENERAL: This is a well-developed, overweight woman, no acute distress, fully ambulatory, no conversational dyspnea. HEAD: Normocephalic, atraumatic.  EYES: Pupils equal, round, reactive to light.  No scleral icterus.  MOUTH: Dentition intact. NECK: Supple. No thyromegaly. Trachea midline. No JVD.  No adenopathy. PULMONARY: Good air entry bilaterally.  No adventitious sounds. CARDIOVASCULAR: S1 and S2. Regular rate and rhythm.  No rubs, murmurs or gallops heard. ABDOMEN: Benign MUSCULOSKELETAL: No joint deformity, no clubbing, no edema.  NEUROLOGIC: No overt focal deficit, no gait disturbance, speech is fluent. SKIN: Intact,warm,dry. PSYCH: Mood and behavior normal  Representative image from already had CT performed 07 April 2022 showing groundglass opacity on the left lower lobe on lung windows (arrow):     Assessment & Plan: Sensitive image from chest CT performed     ICD-10-CM   1. Nodule of left lung  R91.1 CT CHEST WO CONTRAST   Subsolid nodule, ill characterized May be due to inflammatory changes, malignancy not excluded Will repeat CT chest around December  time Follow-up after CT     Will see the patient in follow-up after her CT chest in December.  She is to contact us prior to that time should any new difficulties arise.  Renold Don, MD Advanced Bronchoscopy PCCM [facility]    *This note was dictated using voice recognition software/Dragon.  Despite best efforts to proofread, errors can occur which can change the meaning. Any transcriptional errors that result from this process are unintentional and may not be fully corrected at the time of dictation.

## 2022-05-19 DIAGNOSIS — D235 Other benign neoplasm of skin of trunk: Secondary | ICD-10-CM | POA: Diagnosis not present

## 2022-05-19 DIAGNOSIS — B078 Other viral warts: Secondary | ICD-10-CM | POA: Diagnosis not present

## 2022-05-19 DIAGNOSIS — L400 Psoriasis vulgaris: Secondary | ICD-10-CM | POA: Diagnosis not present

## 2022-05-19 DIAGNOSIS — D485 Neoplasm of uncertain behavior of skin: Secondary | ICD-10-CM | POA: Diagnosis not present

## 2022-05-19 DIAGNOSIS — L821 Other seborrheic keratosis: Secondary | ICD-10-CM | POA: Diagnosis not present

## 2022-05-26 DIAGNOSIS — H6123 Impacted cerumen, bilateral: Secondary | ICD-10-CM | POA: Diagnosis not present

## 2022-05-26 DIAGNOSIS — H902 Conductive hearing loss, unspecified: Secondary | ICD-10-CM | POA: Diagnosis not present

## 2022-09-14 DIAGNOSIS — H353131 Nonexudative age-related macular degeneration, bilateral, early dry stage: Secondary | ICD-10-CM | POA: Diagnosis not present

## 2022-09-19 NOTE — Progress Notes (Unsigned)
Name: Nicole Contreras   MRN: [id number]    DOB: 1946/06/17   Date:09/20/2022       Progress Note  Subjective  Chief Complaint  Follow Up  HPI  Psoriatic arthritis:  She had dysuria with Sulfasalazine, she was on Enbrel but she stopped because she felt it always makes her sick, Dr. Jefm Bryant advised Humira but  not interested. She took Methotrexate and she was worried about infections. She stopped taking it in 2021 She went to   [facility] in the past due to right knee effusion, she has some intermittently instability right knee, discussed going back to Rheumatologist and she said she will go back    Psoriasis: she sees Dr. Kellie Moor. She is using topical medication and worse is on her elbows. Also uses a solution/clobetasol  on her scalp and she states symptoms are controlled . Stable    Hypothyroidism: taking medication daily without food, no palpitation or dysphagia, denies change in bowel movements or hair loss. Same does for a long time. TSH was normal done 03/2022    History of breast cancer: she had right mastectomy in 1985, had local recurrence in 1986 had recurrence on her skin and had to have chemo and radiation. She never had re-constructive surgery at the time because of the chemo and radiation, she wears a prosthesis. Mammogram left breast was done 03/2021, she will have repeat mammogram 10/2022   Senile purpura; both arms, reassurance given. Stable   Palpitation : seen by cardiologist for heart flutters and occasional SOB, had negative evaluation except for pulmonary nodule, some PAC and SVT  Pulmonary nodule: seen by Dr. Duwayne Heck due to abnormal CT lung during CT coronary scan and is supposed to check CT lung 3-6 months. Due by Dec 2023   Patient Active Problem List   Diagnosis Date Noted   Nasal congestion 03/18/2022   Psoriasis of scalp 03/18/2022   Osteopenia after menopause 03/18/2022   Psoriasis 03/18/2022   Senile purpura ([place]) 03/18/2022   Chronic pain of right  knee 03/18/2022   Bilateral primary osteoarthritis of knee 10/07/2021   History of mastectomy, right 11/27/2018   Internal hemorrhoids 12/01/2016   Benign neoplasm of transverse colon    Numerous moles 11/30/2015   Gastro-esophageal reflux disease without esophagitis 09/14/2015   Acquired hypothyroidism 09/14/2015   Menopause 09/14/2015   Hemorrhage, postmenopausal 09/14/2015   Vitamin D deficiency 09/14/2015   History of breast cancer 09/14/2015   Psoriatic arthritis ([place]) 05/22/2014    Past Surgical History:  Procedure Laterality Date   BREAST EXCISIONAL BIOPSY Left 1990   X 2- neg   CARDIAC CATHETERIZATION  [facility]   [facility]; no stent   CATARACT EXTRACTION W/PHACO Right 02/04/2016   Procedure: CATARACT EXTRACTION PHACO AND INTRAOCULAR LENS PLACEMENT ([place]);  Surgeon: Birder Robson, MD;  Location: [facility];  Service: Ophthalmology;  Laterality: Right;  [place] 00:44 AP% 19.2 CDE 8.49 fluid pack lot # [id number]   CATARACT EXTRACTION W/PHACO Left 06/12/2018   Procedure: CATARACT EXTRACTION PHACO AND INTRAOCULAR LENS PLACEMENT (IOC);  Surgeon: Birder Robson, MD;  Location: [facility];  Service: Ophthalmology;  Laterality: Left;  [place] 00:56.8 AP% 15.8 CDE 8.95 Fluid Pack lot # [id number]   colonoscopy     COLONOSCOPY WITH PROPOFOL N/A 07/15/2016   Procedure: COLONOSCOPY WITH PROPOFOL;  Surgeon: [name], MD;  Location: [place];  Service: Endoscopy;  Laterality: N/A;   ENDOMETRIAL BIOPSY     MASTECTOMY Right 1985   POLYPECTOMY N/A 07/15/2016  Procedure: POLYPECTOMY;  Surgeon: [name], MD;  Location: [place];  Service: Endoscopy;  Laterality: N/A;    Family History  Problem Relation Age of Onset   Uterine cancer Mother    Lung cancer Mother    Stroke Mother    Hypertension Mother    Dementia Mother    Heart disease Father    Arrhythmia Father        afib   Cancer Sister 64       thyroid cancer   Cervical cancer Sister    Arrhythmia Sister        afib    Breast cancer Sister 70   Breast cancer Paternal Aunt 60    Social History   Tobacco Use   Smoking status: Former    Packs/day: 1.00    Years: 10.00    Total pack years: 10.00    Types: Cigarettes    Quit date: 11/07/1990    Years since quitting: 31.8   Smokeless tobacco: Never   Tobacco comments:    smoking cessation materials not required  Substance Use Topics   Alcohol use: No    Alcohol/week: 0.0 standard drinks of alcohol     Current Outpatient Medications:    Apremilast 10 & 20 & 30 MG TBPK, Take 1 each by mouth as directed., Disp: 28 each, Rfl: 0   cholecalciferol (VITAMIN D) 1000 units tablet, Take 1,000 Units by mouth daily. , Disp: , Rfl:    Clobetasol Propionate (TEMOVATE) 0.05 % external spray, Apply topically 2 (two) times daily. (Patient taking differently: Apply 1 application  topically 2 (two) times daily as needed (for psoriasis).), Disp: 59 mL, Rfl: 0   fluticasone (FLONASE) 50 MCG/ACT nasal spray, Place 2 sprays into both nostrils daily., Disp: 16 g, Rfl: 2   levothyroxine (SYNTHROID) 75 MCG tablet, Take 1 tablet (75 mcg total) by mouth daily., Disp: 90 tablet, Rfl: 1   loratadine (CLARITIN) 10 MG tablet, Take 1 tablet (10 mg total) by mouth daily., Disp: 90 tablet, Rfl: 1   Multiple Vitamin (MULTIVITAMIN) tablet, Take 1 tablet by mouth daily., Disp: , Rfl:    zinc gluconate 50 MG tablet, Take 50 mg by mouth daily., Disp: , Rfl:    Apremilast (OTEZLA) 30 MG TABS, Take 1 tablet (30 mg total) by mouth 2 (two) times daily. (Patient not taking: Reported on 09/20/2022), Disp: 60 tablet, Rfl: 4  Allergies  Allergen Reactions   Sulfa Antibiotics Rash    I personally reviewed active problem list, medication list, allergies, family history, social history, health maintenance with the patient/caregiver today.   ROS  Constitutional: Negative for fever or weight change.  Respiratory: Negative for cough and shortness of breath.   Cardiovascular: Negative for chest  pain or palpitations.  Gastrointestinal: Negative for abdominal pain, no bowel changes.  Musculoskeletal: Negative for gait problem or joint swelling.  Skin: Negative for rash.  Neurological: Negative for dizziness or headache.  No other specific complaints in a complete review of systems (except as listed in HPI above).   Objective  Vitals:   09/20/22 1311 09/20/22 1317  BP: (!) 142/72 (!) 142/72  Pulse: 94   Resp: 16   Temp: 97.8 F (36.6 C)   TempSrc: Oral   SpO2: 97%   Weight: 188 lb 9.6 oz (85.5 kg)   Height: '5\' 7"'$  (1.702 m)     Body mass index is 29.54 kg/m.  Physical Exam  Constitutional: Patient appears well-developed and well-nourished. Obese  No  distress.  HEENT: head atraumatic, normocephalic, pupils equal and reactive to light, neck supple Cardiovascular: Normal rate, regular rhythm and normal heart sounds.  No murmur heard. No BLE edema. Pulmonary/Chest: Effort normal and breath sounds normal. No respiratory distress. Abdominal: Soft.  There is no tenderness. Skin: AK, SK, senile purpura and psoriasis noticed  Psychiatric: Patient has a normal mood and affect. behavior is normal. Judgment and thought content normal.    PHQ2/9:    09/20/2022    1:14 PM 03/18/2022    1:30 PM 10/26/2021    3:07 PM 09/17/2021    2:23 PM 10/22/2020    2:21 PM  Depression screen PHQ 2/9  Decreased Interest 0 0 0 0 0  Down, Depressed, Hopeless 0 0 0 0 0  PHQ - 2 Score 0 0 0 0 0  Altered sleeping 0 0 0 0   Tired, decreased energy 0 0 0 0   Change in appetite 0 0 0 0   Feeling bad or failure about yourself  0 0 0 0   Trouble concentrating 0 0 0 0   Moving slowly or fidgety/restless 0 0 0 0   Suicidal thoughts 0 0 0 0   PHQ-9 Score 0 0 0 0   Difficult doing work/chores   Not difficult at all Not difficult at all     phq 9 is negative   Fall Risk:    09/20/2022    1:14 PM 03/18/2022    1:30 PM 10/26/2021    3:08 PM 09/17/2021    2:23 PM 10/22/2020    2:22 PM  Fall  Risk   Falls in the past year? 0 0 0 0 0  Number falls in past yr:  0 0 0 0  Injury with Fall?  0 0 0 0  Risk for fall due to : No Fall Risks No Fall Risks No Fall Risks No Fall Risks No Fall Risks  Follow up Education provided;Falls prevention discussed;Falls evaluation completed Falls prevention discussed Falls prevention discussed Falls prevention discussed Falls prevention discussed      Functional Status Survey: Is the patient deaf or have difficulty hearing?: No Does the patient have difficulty seeing, even when wearing glasses/contacts?: No Does the patient have difficulty concentrating, remembering, or making decisions?: No Does the patient have difficulty walking or climbing stairs?: No Does the patient have difficulty dressing or bathing?: No Does the patient have difficulty doing errands alone such as visiting a doctor's office or shopping?: No    Assessment & Plan   1. Psoriatic arthritis ([place])  - Ambulatory referral to Rheumatology  2. Senile purpura ([place])  Reassurance given  3. History of mastectomy, right  She needs another prosthesis   4. Acquired hypothyroidism  - levothyroxine (SYNTHROID) 75 MCG tablet; Take 1 tablet (75 mcg total) by mouth daily.  Dispense: 90 tablet; Refill: 1  5. Osteopenia after menopause   6. Psoriasis  Currently not taking Oztela   7. Pulmonary nodule  Needs to have repeat CT - we will give her the number to contact radiology department

## 2022-09-20 ENCOUNTER — Encounter: Payer: Self-pay | Admitting: Family Medicine

## 2022-09-20 ENCOUNTER — Ambulatory Visit: Payer: Medicare PPO | Admitting: Family Medicine

## 2022-09-20 VITALS — BP 142/72 | HR 94 | Temp 97.8°F | Resp 16 | Ht 67.0 in | Wt 188.6 lb

## 2022-09-20 DIAGNOSIS — D692 Other nonthrombocytopenic purpura: Secondary | ICD-10-CM

## 2022-09-20 DIAGNOSIS — L409 Psoriasis, unspecified: Secondary | ICD-10-CM

## 2022-09-20 DIAGNOSIS — M8580 Other specified disorders of bone density and structure, unspecified site: Secondary | ICD-10-CM

## 2022-09-20 DIAGNOSIS — R911 Solitary pulmonary nodule: Secondary | ICD-10-CM | POA: Diagnosis not present

## 2022-09-20 DIAGNOSIS — Z9011 Acquired absence of right breast and nipple: Secondary | ICD-10-CM

## 2022-09-20 DIAGNOSIS — E039 Hypothyroidism, unspecified: Secondary | ICD-10-CM | POA: Diagnosis not present

## 2022-09-20 DIAGNOSIS — L4050 Arthropathic psoriasis, unspecified: Secondary | ICD-10-CM

## 2022-09-20 DIAGNOSIS — Z780 Asymptomatic menopausal state: Secondary | ICD-10-CM

## 2022-09-20 MED ORDER — LEVOTHYROXINE SODIUM 75 MCG PO TABS: 75.0000 ug | ORAL_TABLET | Freq: Every day | ORAL | 1 refills | Status: DC

## 2022-09-20 NOTE — Patient Instructions (Signed)
Radiology: [phone]

## 2022-10-11 ENCOUNTER — Ambulatory Visit
Admission: RE | Admit: 2022-10-11 | Discharge: 2022-10-11 | Disposition: A | Discharge status: Home / Self Care | Payer: Medicare PPO | Source: Ambulatory Visit | Attending: Family Medicine | Admitting: Family Medicine

## 2022-10-11 DIAGNOSIS — I700 Atherosclerosis of aorta: Secondary | ICD-10-CM | POA: Diagnosis not present

## 2022-10-11 DIAGNOSIS — R911 Solitary pulmonary nodule: Secondary | ICD-10-CM | POA: Diagnosis not present

## 2022-10-12 ENCOUNTER — Other Ambulatory Visit: Payer: Self-pay

## 2022-10-12 DIAGNOSIS — R911 Solitary pulmonary nodule: Secondary | ICD-10-CM

## 2022-10-19 ENCOUNTER — Telehealth: Payer: Self-pay | Admitting: Family Medicine

## 2022-10-19 NOTE — Telephone Encounter (Signed)
Copied from [place] [phone]. Topic: General - Inquiry >> Oct 19, 2022 10:30 AM Marcellus Scott wrote: Reason for CRM: Stanton Kidney from [facility] calling back to follow up on the order from [facility] sent 10/07/2022.   Stanton Kidney stated they have not received it back yet.  Please sign and return with clinical notes.    Fax - [phone] Callback [phone]

## 2022-10-19 NOTE — Telephone Encounter (Signed)
Order faxed 10/10/22 with confirmation of receipt. Will fax again today.

## 2022-10-20 ENCOUNTER — Ambulatory Visit
Admission: RE | Admit: 2022-10-20 | Discharge: 2022-10-20 | Disposition: A | Discharge status: Home / Self Care | Payer: Medicare PPO | Source: Ambulatory Visit | Attending: Family Medicine | Admitting: Family Medicine

## 2022-10-20 DIAGNOSIS — Z780 Asymptomatic menopausal state: Secondary | ICD-10-CM | POA: Insufficient documentation

## 2022-10-20 DIAGNOSIS — M85832 Other specified disorders of bone density and structure, left forearm: Secondary | ICD-10-CM | POA: Diagnosis not present

## 2022-10-20 DIAGNOSIS — Z13820 Encounter for screening for osteoporosis: Secondary | ICD-10-CM | POA: Diagnosis not present

## 2022-10-20 DIAGNOSIS — Z1231 Encounter for screening mammogram for malignant neoplasm of breast: Secondary | ICD-10-CM | POA: Insufficient documentation

## 2022-10-20 DIAGNOSIS — Z9011 Acquired absence of right breast and nipple: Secondary | ICD-10-CM | POA: Insufficient documentation

## 2022-10-20 DIAGNOSIS — M8580 Other specified disorders of bone density and structure, unspecified site: Secondary | ICD-10-CM | POA: Insufficient documentation

## 2022-10-24 DIAGNOSIS — Z9011 Acquired absence of right breast and nipple: Secondary | ICD-10-CM | POA: Diagnosis not present

## 2022-10-24 DIAGNOSIS — Z4431 Encounter for fitting and adjustment of external right breast prosthesis: Secondary | ICD-10-CM | POA: Diagnosis not present

## 2022-10-24 DIAGNOSIS — C50111 Malignant neoplasm of central portion of right female breast: Secondary | ICD-10-CM | POA: Diagnosis not present

## 2022-10-27 ENCOUNTER — Ambulatory Visit (INDEPENDENT_AMBULATORY_CARE_PROVIDER_SITE_OTHER): Payer: Medicare PPO

## 2022-10-27 DIAGNOSIS — Z0000 Encounter for general adult medical examination without abnormal findings: Secondary | ICD-10-CM | POA: Diagnosis not present

## 2022-10-27 NOTE — Progress Notes (Signed)
I connected with  Nicole Contreras on 10/27/22 by a audio enabled telemedicine application and verified that I am speaking with the correct person using two identifiers.  Patient Location: Home  Provider Location: Office/Clinic  I discussed the limitations of evaluation and management by telemedicine. The patient expressed understanding and agreed to proceed.  Subjective:   Nicole Contreras is a 76 y.o. female who presents for Medicare Annual (Subsequent) preventive examination.  Review of Systems    Per HPI unless specifically indicated below.  Cardiac Risk Factors include: advanced age (>27mn, >>37women);female gender        Objective:       09/20/2022    1:17 PM 09/20/2022    1:11 PM 05/17/2022    4:29 PM  Vitals with BMI  Height  '5\' 7"'$  '5\' 7"'$   Weight  188 lbs 10 oz 187 lbs 3 oz  BMI  206.30216.01 Systolic [id number] Diastolic 72 72 80  Pulse  94 81    Today's Vitals   10/27/22 1416  PainSc: 0-No pain   There is no height or weight on file to calculate BMI.     10/27/2022    2:26 PM 10/26/2021    3:07 PM 10/22/2020    2:21 PM 03/14/2019   10:00 AM 06/12/2018    9:58 AM 03/09/2018    9:29 AM 11/12/2017   12:21 PM  Advanced Directives  Does Patient Have a Medical Advance Directive? No No No No No No No  Would patient like information on creating a medical advance directive? No - Patient declined Yes (MAU/Ambulatory/Procedural Areas - Information given) No - Patient declined Yes (MAU/Ambulatory/Procedural Areas - Information given) No - Patient declined Yes (MAU/Ambulatory/Procedural Areas - Information given)     Current Medications (verified) Outpatient Encounter Medications as of 10/27/2022  Medication Sig   cholecalciferol (VITAMIN D) 1000 units tablet Take 1,000 Units by mouth daily.    Clobetasol Propionate (TEMOVATE) 0.05 % external spray Apply topically 2 (two) times daily. (Patient taking differently: Apply 1 application  topically 2 (two) times daily as  needed (for psoriasis).)   fluticasone (FLONASE) 50 MCG/ACT nasal spray Place 2 sprays into both nostrils daily.   levothyroxine (SYNTHROID) 75 MCG tablet Take 1 tablet (75 mcg total) by mouth daily.   loratadine (CLARITIN) 10 MG tablet Take 1 tablet (10 mg total) by mouth daily.   Multiple Vitamin (MULTIVITAMIN) tablet Take 1 tablet by mouth daily.   zinc gluconate 50 MG tablet Take 50 mg by mouth daily.   No facility-administered encounter medications on file as of 10/27/2022.    Allergies (verified) Sulfa antibiotics   History: Past Medical History:  Diagnosis Date   Arthritis    Atypical chest pain    a. 03/2014 ETT: Ex time 4:10, Max HR 160 bpm, no acute st/t changes.   Breast cancer ([facility] 1986   s/p radiation.   Bright's disease    Hx   Diastolic dysfunction    a. 02/2014 Echo: EF 55-60%, no rwma, Gr1 DD.   Dizziness and giddiness    Fecal smearing    GERD (gastroesophageal reflux disease)    Heart murmur    as child   Insomnia, unspecified    Obesity, unspecified    Personal history of chemotherapy    Personal history of radiation therapy 1986   right breast ca   PONV (postoperative nausea and vomiting)    after cataract procedure   Postmenopausal bleeding  Psoriasis    Psoriatic arthropathy (HCC)    Reflux esophagitis    Right cataract    a. 01/2016 s/p cataract surgery.   Spasm of muscle    Unspecified hypothyroidism    Unspecified urinary incontinence    Unspecified vitamin D deficiency    Past Surgical History:  Procedure Laterality Date   BREAST EXCISIONAL BIOPSY Left 1990   X 2- neg   CARDIAC CATHETERIZATION  90's   [facility]; no stent   CATARACT EXTRACTION W/PHACO Right 02/04/2016   Procedure: CATARACT EXTRACTION PHACO AND INTRAOCULAR LENS PLACEMENT (IOC);  Surgeon: Birder Robson, MD;  Location: [facility];  Service: Ophthalmology;  Laterality: Right;  [place] 00:44 AP% 19.2 CDE 8.49 fluid pack lot # [id number]   CATARACT EXTRACTION W/PHACO Left 06/12/2018    Procedure: CATARACT EXTRACTION PHACO AND INTRAOCULAR LENS PLACEMENT (IOC);  Surgeon: Birder Robson, MD;  Location: [facility];  Service: Ophthalmology;  Laterality: Left;  [place] 00:56.8 AP% 15.8 CDE 8.95 Fluid Pack lot # [id number]   colonoscopy     COLONOSCOPY WITH PROPOFOL N/A 07/15/2016   Procedure: COLONOSCOPY WITH PROPOFOL;  Surgeon: [name], MD;  Location: [place];  Service: Endoscopy;  Laterality: N/A;   ENDOMETRIAL BIOPSY     MASTECTOMY Right 1985   POLYPECTOMY N/A 07/15/2016   Procedure: POLYPECTOMY;  Surgeon: [name], MD;  Location: [place];  Service: Endoscopy;  Laterality: N/A;   Family History  Problem Relation Age of Onset   Uterine cancer Mother    Lung cancer Mother    Stroke Mother    Hypertension Mother    Dementia Mother    Heart disease Father    Arrhythmia Father        afib   Cancer Sister 33       thyroid cancer   Cervical cancer Sister    Arrhythmia Sister        afib   Breast cancer Sister 83   Breast cancer Paternal Aunt 69   Social History   Socioeconomic History   Marital status: Married    Spouse name: Quillian Quince   Number of children: 2   Years of education: Not on file   Highest education level: Associate degree: occupational, Hotel manager, or vocational program  Occupational History   Occupation: Retired  Tobacco Use   Smoking status: Former    Packs/day: 1.00    Years: 10.00    Total pack years: 10.00    Types: Cigarettes    Quit date: 11/07/1990    Years since quitting: 31.9   Smokeless tobacco: Never   Tobacco comments:    smoking cessation materials not required  Vaping Use   Vaping Use: Never used  Substance and Sexual Activity   Alcohol use: No    Alcohol/week: 0.0 standard drinks of alcohol   Drug use: No   Sexual activity: Not Currently  Other Topics Concern   Not on file  Social History Narrative   Not on file   Social Determinants of Health   Financial Resource Strain: Low Risk  (10/27/2022)    Overall Financial Resource Strain (CARDIA)    Difficulty of Paying Living Expenses: Not hard at all  Food Insecurity: No Food Insecurity (10/27/2022)   Hunger Vital Sign    Worried About Running Out of Food in the Last Year: Never true    [place] in the Last Year: Never true  Transportation Needs: No Transportation Needs (10/27/2022)   [place] - Transportation  Lack of Transportation (Medical): No    Lack of Transportation (Non-Medical): No  Physical Activity: Inactive (10/27/2022)   Exercise Vital Sign    Days of Exercise per Week: 0 days    Minutes of Exercise per Session: 0 min  Stress: No Stress Concern Present (10/27/2022)   [place]    Feeling of Stress : Not at all  Social Connections: [place] (10/27/2022)   Social Connection and Isolation Panel [NHANES]    Frequency of Communication with Friends and Family: More than three times a week    Frequency of Social Gatherings with Friends and Family: Once a week    Attends Religious Services: More than 4 times per year    Active Member of [employer] or Organizations: Yes    Attends Music therapist: More than 4 times per year    Marital Status: Married    Tobacco Counseling Counseling given: Not Answered Tobacco comments: smoking cessation materials not required   Clinical Intake:  Pre-visit preparation completed: No  Pain : No/denies pain Pain Score: 0-No pain     Nutritional Status: BMI 25 -29 Overweight Nutritional Risks: None Diabetes: No  How often do you need to have someone help you when you read instructions, pamphlets, or other written materials from your doctor or pharmacy?: 1 - Never  Diabetic?No  Interpreter Needed?: No  Information entered by :: Donnie Mesa, CMA   Activities of Daily Living    10/27/2022    2:14 PM 09/20/2022    1:14 PM  In your present state of health, do you have any  difficulty performing the following activities:  Hearing? 0 0  Vision? 1 0  Difficulty concentrating or making decisions? 0 0  Walking or climbing stairs? 0 0  Dressing or bathing? 0 0  Doing errands, shopping? 0 0    Patient Care Team: Steele Sizer, MD as PCP - General (Family Medicine) Emmaline Kluver., MD as Consulting Physician (Rheumatology) Oneta Rack, MD as Consulting Physician (Dermatology) Junius Roads, Legrand Como, MD as Anesthesiologist (Orthodontics)  Indicate any recent Medical Services you may have received from other than [facility] providers in the past year (date may be approximate).   The pt was seen by [facility] [place] on 05/17/2022 for a left lung nodule. Assessment:   This is a routine wellness examination for Nicole Contreras.  Hearing/Vision screen Denies any difficulty with her hearing. Denies any changes in her vision. Annual Eye Exam, [facility]   Dietary issues and exercise activities discussed: Current Exercise Habits: The patient does not participate in regular exercise at present, Exercise limited by: None identified   Goals Addressed   None    Depression Screen    10/27/2022    2:13 PM 09/20/2022    1:14 PM 03/18/2022    1:30 PM 10/26/2021    3:07 PM 09/17/2021    2:23 PM 10/22/2020    2:21 PM 09/16/2020    3:15 PM  PHQ 2/9 Scores  PHQ - 2 Score 0 0 0 0 0 0 0  PHQ- 9 Score  0 0 0 0      Fall Risk    10/27/2022    2:13 PM 09/20/2022    1:14 PM 03/18/2022    1:30 PM 10/26/2021    3:08 PM 09/17/2021    2:23 PM  [place] in the past year? 0 0 0 0 0  Number falls in past yr:  0  0 0 0  Injury with Fall? 0  0 0 0  Risk for fall due to : No Fall Risks No Fall Risks No Fall Risks No Fall Risks No Fall Risks  Follow up Falls evaluation completed Education provided;Falls prevention discussed;Falls evaluation completed Falls prevention discussed Falls prevention discussed Falls prevention discussed    FALL RISK  PREVENTION PERTAINING TO THE HOME:  Any stairs in or around the home? No  If so, are there any without handrails? Yes  Home free of loose throw rugs in walkways, pet beds, electrical cords, etc? Yes  Adequate lighting in your home to reduce risk of falls? Yes   ASSISTIVE DEVICES UTILIZED TO PREVENT FALLS:  Life alert? No  Use of a cane, walker or w/c? No  Grab bars in the bathroom? Yes  Shower chair or bench in shower? Yes  Elevated toilet seat or a handicapped toilet? Yes   TIMED UP AND GO:  Was the test performed?  unable to perform, virtual appt .  Cognitive Function:        10/27/2022    2:14 PM 03/14/2019   10:04 AM 03/09/2018    9:16 AM  6CIT Screen  What Year? 0 points 0 points 0 points  What month? 0 points 0 points 0 points  What time? 0 points 0 points 3 points  Count back from 20 0 points 0 points 0 points  Months in reverse 0 points 0 points 0 points  Repeat phrase 0 points 0 points 0 points  Total Score 0 points 0 points 3 points    Immunizations Immunization History  Administered Date(s) Administered   Fluad Quad(high Dose 65+) 08/16/2019   Influenza, High Dose Seasonal PF 08/08/2017   Influenza, Seasonal, Injecte, Preservative Fre 08/16/2012, 08/07/2013   Influenza,inj,Quad PF,6+ Mos 08/04/2014   Influenza,inj,quad, With Preservative 08/31/2018   Influenza-Unspecified 08/24/2015, 08/18/2016, 08/23/2018   Pneumococcal Conjugate-13 09/14/2015   Pneumococcal Polysaccharide-23 08/19/2013, 08/16/2019   Tdap 10/05/2010, 09/16/2020   Zoster, Live 04/08/2011    TDAP status: Up to date  Flu Vaccine status: Due, Education has been provided regarding the importance of this vaccine. Advised may receive this vaccine at local pharmacy or Health Dept. Aware to provide a copy of the vaccination record if obtained from local pharmacy or Health Dept. Verbalized acceptance and understanding.  Pneumococcal vaccine status: Up to date  Covid-19 vaccine status: Declined,  Education has been provided regarding the importance of this vaccine but patient still declined. Advised may receive this vaccine at local pharmacy or Health Dept.or vaccine clinic. Aware to provide a copy of the vaccination record if obtained from local pharmacy or Health Dept. Verbalized acceptance and understanding.  Qualifies for Shingles Vaccine? Yes   Zostavax completed No   Shingrix Completed?: No.    Education has been provided regarding the importance of this vaccine. Patient has been advised to call insurance company to determine out of pocket expense if they have not yet received this vaccine. Advised may also receive vaccine at local pharmacy or Health Dept. Verbalized acceptance and understanding.  Screening Tests Health Maintenance  Topic Date Due   Zoster Vaccines- Shingrix (1 of 2) 12/21/2022 (Originally 08/28/1965)   INFLUENZA VACCINE  02/05/2023 (Originally 06/07/2022)   MAMMOGRAM  10/21/2023   Medicare Annual Wellness (AWV)  10/28/2023   COLONOSCOPY (Pts 45-66yr Insurance coverage will need to be confirmed)  07/15/2026   DTaP/Tdap/Td (3 - Td or Tdap) 09/16/2030   Pneumonia Vaccine 74 Years old  Completed  DEXA SCAN  Completed   Hepatitis C Screening  Completed   HPV VACCINES  Aged Out   COVID-19 Vaccine  Discontinued    Health Maintenance  There are no preventive care reminders to display for this patient.   Colorectal cancer screening: Type of screening: Colonoscopy. Completed 07/25/2016. Repeat every 10 years  Mammogram status: Completed 10/20/22. Repeat every year  DEXA Scan:10/20/2022  Lung Cancer Screening: (Low Dose CT Chest recommended if Age 52-80 years, 30 pack-year currently smoking OR have quit w/in 15years.) does not qualify.   Lung Cancer Screening Referral: not applicable   Additional Screening:  Hepatitis C Screening: does qualify; Completed 03/20/2013  Vision Screening: Recommended annual ophthalmology exams for early detection of glaucoma  and other disorders of the eye. Is the patient up to date with their annual eye exam?  Yes  Who is the provider or what is the name of the office in which the patient attends annual eye exams? [facility]  If pt is not established with a provider, would they like to be referred to a provider to establish care? No .   Dental Screening: Recommended annual dental exams for proper oral hygiene  Community Resource Referral / Chronic Care Management: CRR required this visit?  No   CCM required this visit?  No      Plan:     I have personally reviewed and noted the following in the patient's chart:   Medical and social history Use of alcohol, tobacco or illicit drugs  Current medications and supplements including opioid prescriptions. Patient is not currently taking opioid prescriptions. Functional ability and status Nutritional status Physical activity Advanced directives List of other physicians Hospitalizations, surgeries, and ER visits in previous 12 months Vitals Screenings to include cognitive, depression, and falls Referrals and appointments  In addition, I have reviewed and discussed with patient certain preventive protocols, quality metrics, and best practice recommendations. A written personalized care plan for preventive services as well as general preventive health recommendations were provided to patient.    Nicole Contreras , Thank you for taking time to come for your Medicare Wellness Visit. I appreciate your ongoing commitment to your health goals. Please review the following plan we discussed and let me know if I can assist you in the future.   These are the goals we discussed:  Goals      DIET - INCREASE WATER INTAKE     Recommend to drink at least 6-8 8oz glasses of water per day.        This is a list of the screening recommended for you and due dates:  Health Maintenance  Topic Date Due   Zoster (Shingles) Vaccine (1 of 2) 12/21/2022*   Flu Shot   02/05/2023*   Mammogram  10/21/2023   Medicare Annual Wellness Visit  10/28/2023   Colon Cancer Screening  07/15/2026   DTaP/Tdap/Td vaccine (3 - Td or Tdap) 09/16/2030   Pneumonia Vaccine  Completed   DEXA scan (bone density measurement)  Completed   Hepatitis C Screening: USPSTF Recommendation to screen - Ages 87-79 yo.  Completed   HPV Vaccine  Aged Out   COVID-19 Vaccine  Discontinued  *Topic was postponed. The date shown is not the original due date.     Wilson Singer, [place]   10/27/2022   Nurse Notes: Approximately 30 minute Non-Face -To-Face Medicare Wellness Visit

## 2022-10-27 NOTE — Patient Instructions (Signed)

## 2022-11-24 DIAGNOSIS — H902 Conductive hearing loss, unspecified: Secondary | ICD-10-CM | POA: Diagnosis not present

## 2022-11-24 DIAGNOSIS — H6123 Impacted cerumen, bilateral: Secondary | ICD-10-CM | POA: Diagnosis not present

## 2022-12-02 ENCOUNTER — Encounter: Payer: Self-pay | Admitting: Pulmonary Disease

## 2023-01-04 DIAGNOSIS — R899 Unspecified abnormal finding in specimens from other organs, systems and tissues: Secondary | ICD-10-CM | POA: Diagnosis not present

## 2023-01-04 DIAGNOSIS — L4050 Arthropathic psoriasis, unspecified: Secondary | ICD-10-CM | POA: Diagnosis not present

## 2023-01-04 DIAGNOSIS — M47816 Spondylosis without myelopathy or radiculopathy, lumbar region: Secondary | ICD-10-CM | POA: Diagnosis not present

## 2023-01-04 DIAGNOSIS — M160 Bilateral primary osteoarthritis of hip: Secondary | ICD-10-CM | POA: Diagnosis not present

## 2023-01-04 DIAGNOSIS — M25552 Pain in left hip: Secondary | ICD-10-CM | POA: Diagnosis not present

## 2023-01-04 DIAGNOSIS — L409 Psoriasis, unspecified: Secondary | ICD-10-CM | POA: Diagnosis not present

## 2023-01-04 DIAGNOSIS — M25551 Pain in right hip: Secondary | ICD-10-CM | POA: Diagnosis not present

## 2023-01-18 ENCOUNTER — Encounter: Payer: Self-pay | Admitting: Pulmonary Disease

## 2023-01-18 ENCOUNTER — Ambulatory Visit: Payer: Medicare PPO | Admitting: Pulmonary Disease

## 2023-01-18 VITALS — BP 138/82 | HR 78 | Temp 97.1°F | Ht 67.0 in | Wt 193.8 lb

## 2023-01-18 DIAGNOSIS — Z853 Personal history of malignant neoplasm of breast: Secondary | ICD-10-CM | POA: Diagnosis not present

## 2023-01-18 DIAGNOSIS — R911 Solitary pulmonary nodule: Secondary | ICD-10-CM

## 2023-01-18 DIAGNOSIS — L4050 Arthropathic psoriasis, unspecified: Secondary | ICD-10-CM

## 2023-01-18 NOTE — Progress Notes (Signed)
Subjective:    Patient ID: Nicole Contreras, female    DOB: 1946/07/24, 77 y.o.   MRN: [id number] Patient Care Team: Steele Sizer, MD as PCP - General (Family Medicine) Emmaline Kluver., MD as Consulting Physician (Rheumatology) Oneta Rack, MD as Consulting Physician (Dermatology) Eunice Blase, MD as Anesthesiologist (Orthodontics)  Chief Complaint  Patient presents with   Follow-up    Nodule.    HPI This is a 77 year old remote former smoker with minimal smoking history, and history as noted below, who presents for evaluation of a lung nodule noted incidentally on cardiac screening CT. She was initially evaluated here on 17 May 2022 for the details of that visit please refer to that note.  Patient has not had any symptoms with regards to this nodule. She was being evaluated for shortness of breath therefore the study was done. Since that study she has noted that her shortness of breath has abated.  She is status post right mastectomy in 1986 for breast cancer.  She has not had any evidence of recurrence since then.  She has a history of psoriatic arthritis with HLA-B27 positive marker and is on no Biologics for control.  Previously had been on Enbrel and methotrexate but had to get off of these due to intolerance and cost.  Quit smoking cigarettes altogether in 1992 with a 10-pack-year history total.   She does not endorse any fevers, chills or sweats, no cough or sputum production.  No chest pain, orthopnea or paroxysmal nocturnal dyspnea.  Has no lower extremity edema or calf tenderness.   Review of Systems A 10 point review of systems was performed and it is as noted above otherwise negative.  Patient Active Problem List   Diagnosis Date Noted   Nasal congestion 03/18/2022   Psoriasis of scalp 03/18/2022   Osteopenia after menopause 03/18/2022   Psoriasis 03/18/2022   Senile purpura ([place]) 03/18/2022   Chronic pain of right knee 03/18/2022   Bilateral primary  osteoarthritis of knee 10/07/2021   History of mastectomy, right 11/27/2018   Internal hemorrhoids 12/01/2016   Benign neoplasm of transverse colon    Numerous moles 11/30/2015   Gastro-esophageal reflux disease without esophagitis 09/14/2015   Acquired hypothyroidism 09/14/2015   Menopause 09/14/2015   Hemorrhage, postmenopausal 09/14/2015   Vitamin D deficiency 09/14/2015   History of breast cancer 09/14/2015   Psoriatic arthritis ([place]) 05/22/2014   Social History   Tobacco Use   Smoking status: Former    Packs/day: 1.00    Years: 10.00    Total pack years: 10.00    Types: Cigarettes    Quit date: 11/07/1990    Years since quitting: 32.2   Smokeless tobacco: Never   Tobacco comments:    smoking cessation materials not required  Substance Use Topics   Alcohol use: No    Alcohol/week: 0.0 standard drinks of alcohol   Allergies  Allergen Reactions   Sulfa Antibiotics Rash   Current Meds  Medication Sig   cholecalciferol (VITAMIN D) 1000 units tablet Take 1,000 Units by mouth daily.    Clobetasol Propionate (TEMOVATE) 0.05 % external spray Apply topically 2 (two) times daily. (Patient taking differently: Apply 1 application  topically 2 (two) times daily as needed (for psoriasis).)   fluticasone (FLONASE) 50 MCG/ACT nasal spray Place 2 sprays into both nostrils daily.   levothyroxine (SYNTHROID) 75 MCG tablet Take 1 tablet (75 mcg total) by mouth daily.   loratadine (CLARITIN) 10 MG tablet Take 1 tablet (  10 mg total) by mouth daily.   Multiple Vitamin (MULTIVITAMIN) tablet Take 1 tablet by mouth daily.   zinc gluconate 50 MG tablet Take 50 mg by mouth daily.   Immunization History  Administered Date(s) Administered   Fluad Quad(high Dose 65+) 08/16/2019   Influenza, High Dose Seasonal PF 08/08/2017   Influenza, Seasonal, Injecte, Preservative Fre 08/16/2012, 08/07/2013   Influenza,inj,Quad PF,6+ Mos 08/04/2014   Influenza,inj,quad, With Preservative 08/31/2018    Influenza-Unspecified 08/24/2015, 08/18/2016, 08/23/2018   Pneumococcal Conjugate-13 09/14/2015   Pneumococcal Polysaccharide-23 08/19/2013, 08/16/2019   Tdap 10/05/2010, 09/16/2020   Zoster, Live 04/08/2011       Objective:   Physical Exam BP 138/82 (BP Location: Left Arm, Cuff Size: Normal)   Pulse 78   Temp (!) 97.1 F (36.2 C)   Ht '5\' 7"'$  (1.702 m)   Wt 193 lb 12.8 oz (87.9 kg)   SpO2 99%   BMI 30.35 kg/m   SpO2: 99 % O2 Device: None (Room air)  GENERAL: This is a well-developed, overweight woman, no acute distress, fully ambulatory, no conversational dyspnea. HEAD: Normocephalic, atraumatic.  EYES: Pupils equal, round, reactive to light.  No scleral icterus.  MOUTH: Dentition intact. NECK: Supple. No thyromegaly. Trachea midline. No JVD.  No adenopathy. PULMONARY: Good air entry bilaterally.  No adventitious sounds. CARDIOVASCULAR: S1 and S2. Regular rate and rhythm.  No rubs, murmurs or gallops heard. ABDOMEN: Benign MUSCULOSKELETAL: No joint deformity, no clubbing, no edema.  NEUROLOGIC: No overt focal deficit, no gait disturbance, speech is fluent. SKIN: Intact,warm,dry. PSYCH: Mood and behavior normal  Representative image from CT performed 11 October 2022 showing the subsolid 1.8 x 1.1 cm pulmonary nodule with 0.4 cm solid component (arrow):   SPN Malignancy Risk Score ([place]): 62%    Assessment & Plan:     ICD-10-CM   1. Nodule of left lung - Subsolid 1.8 X 1.1 cm  R91.1    Left lower lobe SPN malignancy risk score: 62% Follow-up CT in June Nodify Lung ([employer]) drawn today    2. Psoriatic arthritis ([place])  L40.50    HLA-B27 positive On no controller agents    3. History of breast cancer  Z85.3    Status post right mastectomy 1986 No evidence of recurrence     Patient will have follow-up CT chest on 14 April 2023.  Await results of Nodify Lung ([employer]).  Will see the patient in follow-up after chest CT is performed.  She is to contact us prior to  that time should any new difficulties arise.  Renold Don, MD Advanced Bronchoscopy PCCM [facility]    *This note was dictated using voice recognition software/Dragon.  Despite best efforts to proofread, errors can occur which can change the meaning. Any transcriptional errors that result from this process are unintentional and may not be fully corrected at the time of dictation.

## 2023-01-18 NOTE — Patient Instructions (Addendum)
Our scheduling your CT scan for June.  We will see you after that CT scan is done to discuss next steps.  We are also drawing some blood work that will let us know if the nodule falls on the least concerning or more concerning category.  Will see you in June as noted above after your CT scan.

## 2023-01-20 ENCOUNTER — Telehealth: Payer: Self-pay

## 2023-01-20 NOTE — Telephone Encounter (Signed)
I have copied below an email I received from [facility] with [employer].     Rene Paci,   My client services team just reached out regarding the Nodify test sent in for patient Nicole Contreras (DOB: [birth year]). She has a listed cancer history of "Breast Cancer" (1986). Which makes her Nodify test "off label" for the first initial CDT portion of the test. I can send you a [employer] Finacial Assistance form for the patient to fill out.    Can you get permission from Dr. Patsey Berthold to proceed with the processing of this Nodify test?   Thank you,  Guss Bunde  WARNING: This email origi

## 2023-01-20 NOTE — Telephone Encounter (Signed)
Dr. Patsey Berthold opted to not run the test and continue to follow the patient like we have been doing with imaging.  I have notified the patient.  Nothing further is needed.

## 2023-03-20 NOTE — Progress Notes (Unsigned)
Name: Nicole Contreras   MRN: [id number]    DOB: 1945/12/02   Date:03/21/2023       Progress Note  Subjective  Chief Complaint  Follow Up  HPI  Psoriatic arthritis:  She had dysuria with Sulfasalazine, she was on Enbrel but she stopped because she felt it always makes her sick, Dr. Gavin Potters advised Humira but  not interested. She took Methotrexate and She stopped taking it in 2021 due to risk of infections.  She is now under the care of Dr. Allena Katz, had multiple X-rays. She does not want to resume medications at this time. She states pain is very mild but has some stiffness when she wakes up but resolves when she takes a shower, lasting less than 20 minutes. She takes otc NSAID's prn  Reminded her of psoriasis and the risk of heart disease.   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 Roseanne Kaufman. She is using topical medication and worse is on her elbows. Also uses a solution/clobetasol  on her scalp and she states symptoms are controlled .Stable    Hypothyroidism: taking medication daily without food, no palpitation or dysphagia, denies change in bowel movements or hair loss. Same does for a long time. TSH was normal done 03/2022 we will recheck it today    History of breast cancer: she had right mastectomy in 1985, had local recurrence in 1986 had recurrence on her skin and had to have chemo and radiation. She never had re-constructive surgery at the time because of the chemo and radiation, she wears a prosthesis. Mammogram left breast was done 03/2021, she will have repeat mammogram 10/2022   Senile purpura; both arms, stable   Recent episode of chest pain: history of palpitation and was  seen by cardiologist June 2023  for heart flutters and occasional SOB, had negative evaluation except for pulmonary nodule, some PAC and SVT. She states  that on Sunday she developed substernal tightness , caused some SOB and she felt tired for the rest of the day. Denies associated diaphoresis or nausea. She is feeling well now. Reviewed old labs, explained it is safer to go back to see cardiologist   IMPRESSION:04/2022 1. Coronary calcium score of 80.2. This was 62nd percentile for age and sex matched control.   2. Normal coronary origin with right dominance.   3. Mild proximal LAD stenosis (25%).   4. CAD-RADS 2. Mild non-obstructive CAD (25-49%). Consider non-atherosclerotic causes of chest pain. Consider preventive therapy and risk factor modification.   Myoview done 09/2016 There was no ST segment deviation noted during stress. No T wave inversion was noted during stress. Defect 1: There is a small defect of mild severity present in the apex location. This is likely due to breast attenuation. The study is normal. This is a low risk study. The left ventricular ejection fraction is normal (55-65%).  Pulmonary nodule: seen by Dr. Marcos Eke due to abnormal CT lung during CT coronary scan and is supposed to check CT again June 2024  Patient Active Problem List   Diagnosis Date Noted   Nasal congestion 03/18/2022   Psoriasis of scalp 03/18/2022   Osteopenia after menopause 03/18/2022   Psoriasis 03/18/2022   Senile purpura (HCC) 03/18/2022   Chronic pain of right knee 03/18/2022   Bilateral primary osteoarthritis of knee 10/07/2021   History of mastectomy, right  11/27/2018   Internal hemorrhoids 12/01/2016   Benign neoplasm of transverse colon    Numerous moles 11/30/2015   Gastro-esophageal reflux disease without esophagitis 09/14/2015   Acquired hypothyroidism 09/14/2015   Menopause 09/14/2015   Hemorrhage, postmenopausal 09/14/2015   Vitamin D deficiency 09/14/2015   History of breast cancer 09/14/2015   Psoriatic arthritis (HCC) 05/22/2014    Past Surgical History:  Procedure Laterality Date   BREAST EXCISIONAL  BIOPSY Left 1990   X 2- neg   CARDIAC CATHETERIZATION  [facility]   [facility]; no stent   CATARACT EXTRACTION W/PHACO Right 02/04/2016   Procedure: CATARACT EXTRACTION PHACO AND INTRAOCULAR LENS PLACEMENT (IOC);  Surgeon: Galen Manila, MD;  Location: [facility];  Service: Ophthalmology;  Laterality: Right;  [place] 00:44 AP% 19.2 CDE 8.49 fluid pack lot # [id number]   CATARACT EXTRACTION W/PHACO Left 06/12/2018   Procedure: CATARACT EXTRACTION PHACO AND INTRAOCULAR LENS PLACEMENT (IOC);  Surgeon: Galen Manila, MD;  Location: [facility];  Service: Ophthalmology;  Laterality: Left;  [place] 00:56.8 AP% 15.8 CDE 8.95 Fluid Pack lot # [id number]   colonoscopy     COLONOSCOPY WITH PROPOFOL N/A 07/15/2016   Procedure: COLONOSCOPY WITH PROPOFOL;  Surgeon: Midge Minium, MD;  Location: [facility];  Service: Endoscopy;  Laterality: N/A;   ENDOMETRIAL BIOPSY     MASTECTOMY Right 1985   POLYPECTOMY N/A 07/15/2016   Procedure: POLYPECTOMY;  Surgeon: Midge Minium, MD;  Location: [facility];  Service: Endoscopy;  Laterality: N/A;    Family History  Problem Relation Age of Onset   Uterine cancer Mother    Lung cancer Mother    Stroke Mother    Hypertension Mother    Dementia Mother    Heart disease Father    Arrhythmia Father        afib   Cancer Sister 56       thyroid cancer   Cervical cancer Sister    Arrhythmia Sister        afib   Breast cancer Sister 45   Breast cancer Paternal Aunt 74    Social History   Tobacco Use   Smoking status: Former    Packs/day: 1.00    Years: 10.00    Additional pack years: 0.00    Total pack years: 10.00    Types: Cigarettes    Quit date: 11/07/1990    Years since quitting: 32.3   Smokeless tobacco: Never   Tobacco comments:    smoking cessation materials not required  Substance Use Topics   Alcohol use: No    Alcohol/week: 0.0 standard drinks of alcohol     Current Outpatient Medications:    cholecalciferol (VITAMIN D) 1000 units tablet, Take  1,000 Units by mouth daily. , Disp: , Rfl:    Clobetasol Propionate (TEMOVATE) 0.05 % external spray, Apply topically 2 (two) times daily., Disp: 59 mL, Rfl: 0   fluticasone (FLONASE) 50 MCG/ACT nasal spray, Place 2 sprays into both nostrils daily., Disp: 16 g, Rfl: 2   levothyroxine (SYNTHROID) 75 MCG tablet, Take 1 tablet (75 mcg total) by mouth daily., Disp: 90 tablet, Rfl: 1   loratadine (CLARITIN) 10 MG tablet, Take 1 tablet (10 mg total) by mouth daily., Disp: 90 tablet, Rfl: 1   Multiple Vitamin (MULTIVITAMIN) tablet, Take 1 tablet by mouth daily., Disp: , Rfl:    zinc gluconate 50 MG tablet, Take 50 mg by mouth daily., Disp: , Rfl:   Allergies  Allergen Reactions   Sulfa Antibiotics Rash  I personally reviewed active problem list, medication list, allergies, family history, social history, health maintenance with the patient/caregiver today.   ROS  Ten systems reviewed and is negative except as mentioned in HPI   Objective  Vitals:   03/21/23 0832  BP: 132/72  Pulse: 95  Resp: 16  SpO2: 97%  Weight: 191 lb (86.6 kg)  Height: 5\' 7"  (1.702 m)    Body mass index is 29.91 kg/m.  Physical Exam  Constitutional: Patient appears well-developed and well-nourished.  No distress.  HEENT: head atraumatic, normocephalic, pupils equal and reactive to light, neck supple, throat within normal limits Cardiovascular: Normal rate, regular rhythm and normal heart sounds.  No murmur heard. No BLE edema. Pulmonary/Chest: Effort normal and breath sounds normal. No respiratory distress. Abdominal: Soft.  There is no tenderness. Muscular skeletal: hand deformities from psoriatic arthritis, Psychiatric: Patient has a normal mood and affect. behavior is normal. Judgment and thought content normal.   PHQ2/9:    03/21/2023    8:32 AM 10/27/2022    2:13 PM 09/20/2022    1:14 PM 03/18/2022    1:30 PM 10/26/2021    3:07 PM  Depression screen PHQ 2/9  Decreased Interest 0 0 0 0 0  Down,  Depressed, Hopeless 0 0 0 0 0  PHQ - 2 Score 0 0 0 0 0  Altered sleeping 0  0 0 0  Tired, decreased energy 0  0 0 0  Change in appetite 0  0 0 0  Feeling bad or failure about yourself  0  0 0 0  Trouble concentrating 0  0 0 0  Moving slowly or fidgety/restless 0  0 0 0  Suicidal thoughts 0  0 0 0  PHQ-9 Score 0  0 0 0  Difficult doing work/chores     Not difficult at all    phq 9 is negative   Fall Risk:    03/21/2023    8:32 AM 10/27/2022    2:13 PM 09/20/2022    1:14 PM 03/18/2022    1:30 PM 10/26/2021    3:08 PM  Fall Risk   Falls in the past year? 0 0 0 0 0  Number falls in past yr: 0 0  0 0  Injury with Fall? 0 0  0 0  Risk for fall due to : No Fall Risks No Fall Risks No Fall Risks No Fall Risks No Fall Risks  Follow up Falls prevention discussed Falls evaluation completed Education provided;Falls prevention discussed;Falls evaluation completed Falls prevention discussed Falls prevention discussed      Functional Status Survey: Is the patient deaf or have difficulty hearing?: No Does the patient have difficulty seeing, even when wearing glasses/contacts?: No Does the patient have difficulty concentrating, remembering, or making decisions?: No Does the patient have difficulty walking or climbing stairs?: No Does the patient have difficulty dressing or bathing?: No Does the patient have difficulty doing errands alone such as visiting a doctor's office or shopping?: No    Assessment & Plan  1. Chest pain at rest  - EKG 12-Lead - Lipid panel - CBC with Differential/Platelet - CK total and CKMB (cardiac)not at [facility] - Troponin I  Explained that cardiac enzymes may be normal due to symptoms being almost 48 hours ago, and give her false reassurance She must call 911 if symptoms returns  Explained importance of following up with cardiologist but she is not sure if she will go at this time  2. Psoriatic arthritis (HCC)  Continue follow up with Dr. Allena Katz   3.  Senile purpura (HCC)  Reassurance given   4. Atherosclerosis of aorta (HCC)  - Lipid panel  5. Osteopenia after menopause  - VITAMIN D 25 Hydroxy (Vit-D Deficiency, Fractures)  6. Acquired hypothyroidism  - TSH  7. History of mastectomy, right  Up to date with mammogram   8. CAD in native artery  She refuses statin therapy, she will consider aspirin   9. Long-term use of high-risk medication  - COMPLETE METABOLIC PANEL WITH GFR

## 2023-03-21 ENCOUNTER — Ambulatory Visit: Payer: Medicare PPO | Admitting: Family Medicine

## 2023-03-21 ENCOUNTER — Encounter: Payer: Self-pay | Admitting: Family Medicine

## 2023-03-21 VITALS — BP 132/72 | HR 95 | Resp 16 | Ht 67.0 in | Wt 191.0 lb

## 2023-03-21 DIAGNOSIS — L4050 Arthropathic psoriasis, unspecified: Secondary | ICD-10-CM

## 2023-03-21 DIAGNOSIS — R079 Chest pain, unspecified: Secondary | ICD-10-CM

## 2023-03-21 DIAGNOSIS — Z9011 Acquired absence of right breast and nipple: Secondary | ICD-10-CM | POA: Diagnosis not present

## 2023-03-21 DIAGNOSIS — I2510 Atherosclerotic heart disease of native coronary artery without angina pectoris: Secondary | ICD-10-CM | POA: Diagnosis not present

## 2023-03-21 DIAGNOSIS — D692 Other nonthrombocytopenic purpura: Secondary | ICD-10-CM | POA: Diagnosis not present

## 2023-03-21 DIAGNOSIS — Z780 Asymptomatic menopausal state: Secondary | ICD-10-CM

## 2023-03-21 DIAGNOSIS — Z79899 Other long term (current) drug therapy: Secondary | ICD-10-CM

## 2023-03-21 DIAGNOSIS — E039 Hypothyroidism, unspecified: Secondary | ICD-10-CM | POA: Diagnosis not present

## 2023-03-21 DIAGNOSIS — M8580 Other specified disorders of bone density and structure, unspecified site: Secondary | ICD-10-CM

## 2023-03-21 DIAGNOSIS — I700 Atherosclerosis of aorta: Secondary | ICD-10-CM

## 2023-03-22 LAB — VITAMIN D 25 HYDROXY (VIT D DEFICIENCY, FRACTURES): Vit D, 25-Hydroxy: 55 ng/mL (ref 30–100)

## 2023-03-22 LAB — COMPLETE METABOLIC PANEL WITH GFR
AG Ratio: 1.7 (calc) (ref 1.0–2.5)
ALT: 13 U/L (ref 6–29)
AST: 21 U/L (ref 10–35)
Albumin: 4.2 g/dL (ref 3.6–5.1)
Alkaline phosphatase (APISO): 103 U/L (ref 37–153)
BUN: 15 mg/dL (ref 7–25)
CO2: 27 mmol/L (ref 20–32)
Calcium: 9.2 mg/dL (ref 8.6–10.4)
Chloride: 106 mmol/L (ref 98–110)
Creat: 0.68 mg/dL (ref 0.60–1.00)
Globulin: 2.5 g/dL (calc) (ref 1.9–3.7)
Glucose, Bld: 91 mg/dL (ref 65–99)
Potassium: 4.5 mmol/L (ref 3.5–5.3)
Sodium: 142 mmol/L (ref 135–146)
Total Bilirubin: 0.4 mg/dL (ref 0.2–1.2)
Total Protein: 6.7 g/dL (ref 6.1–8.1)
eGFR: 91 mL/min/{1.73_m2} (ref >=60)

## 2023-03-22 LAB — CK TOTAL AND CKMB (NOT AT ARMC)
CK, MB: <0.7 ng/mL (ref 0–5.0)
Total CK: 109 U/L (ref 29–143)

## 2023-03-22 LAB — LIPID PANEL
Cholesterol: 159 mg/dL (ref <200)
HDL: 81 mg/dL (ref >=50)
LDL Cholesterol (Calc): 60 mg/dL (calc)
Non-HDL Cholesterol (Calc): 78 mg/dL (calc) (ref <130)
Total CHOL/HDL Ratio: 2 (calc) (ref <5.0)
Triglycerides: 101 mg/dL (ref <150)

## 2023-03-22 LAB — CBC WITH DIFFERENTIAL/PLATELET
Absolute Monocytes: 439 cells/uL (ref 200–950)
Basophils Absolute: 58 cells/uL (ref 0–200)
Basophils Relative: 0.8 %
Eosinophils Absolute: 122 cells/uL (ref 15–500)
Eosinophils Relative: 1.7 %
HCT: 39.5 % (ref 35.0–45.0)
Hemoglobin: 13.2 g/dL (ref 11.7–15.5)
Lymphs Abs: 1627 cells/uL (ref 850–3900)
MCH: 28 pg (ref 27.0–33.0)
MCHC: 33.4 g/dL (ref 32.0–36.0)
MCV: 83.9 fL (ref 80.0–100.0)
MPV: 10.2 fL (ref 7.5–12.5)
Monocytes Relative: 6.1 %
Neutro Abs: 4954 cells/uL (ref 1500–7800)
Neutrophils Relative %: 68.8 %
Platelets: 247 10*3/uL (ref 140–400)
RBC: 4.71 10*6/uL (ref 3.80–5.10)
RDW: 13.2 % (ref 11.0–15.0)
Total Lymphocyte: 22.6 %
WBC: 7.2 10*3/uL (ref 3.8–10.8)

## 2023-03-22 LAB — TSH: TSH: 1.07 mIU/L (ref 0.40–4.50)

## 2023-03-22 LAB — TROPONIN I: Troponin I: 6 ng/L (ref <=47)

## 2023-03-24 ENCOUNTER — Encounter: Payer: Self-pay | Admitting: Cardiovascular Disease

## 2023-03-24 ENCOUNTER — Ambulatory Visit: Payer: Medicare PPO | Attending: Cardiovascular Disease | Admitting: Cardiovascular Disease

## 2023-03-24 VITALS — BP 138/90 | HR 78 | Ht 67.0 in | Wt 191.5 lb

## 2023-03-24 DIAGNOSIS — R911 Solitary pulmonary nodule: Secondary | ICD-10-CM | POA: Diagnosis not present

## 2023-03-24 DIAGNOSIS — I200 Unstable angina: Secondary | ICD-10-CM | POA: Diagnosis not present

## 2023-03-24 DIAGNOSIS — R0602 Shortness of breath: Secondary | ICD-10-CM | POA: Diagnosis not present

## 2023-03-24 MED ORDER — ASPIRIN 81 MG PO TBEC: 81.0000 mg | DELAYED_RELEASE_TABLET | Freq: Every day | ORAL | 3 refills | Status: AC

## 2023-03-24 NOTE — Progress Notes (Signed)
Cardiology Office Note   Date:  03/24/2023   ID:  Nicole, Contreras Oct 06, 1946, MRN [id number]  PCP:  Alba Cory, MD  Cardiologist:   Lorine Bears, MD   Chief Complaint  Patient presents with   Follow-up    Chest pain/ABN EKG per Dr. Carlynn Purl. Meds reviewed verbally with pt.      History of Present Illness: Nicole Contreras is a 77 y.o. female who was referred back by Dr. Carlynn Purl for evaluation of chest pain.    She has known history of hypothyroidism, GERD, breast cancer with extensive radiation and chemotherapy in 1986 and psoriasis.  She is a previous remote smoker and has no family history of premature coronary artery disease.  She was seen in our office in 2015 in 2017 for atypical chest pain.  She had a nuclear stress test in 2017 that showed no evidence of ischemia with normal ejection fraction.  Echocardiogram in 2015 showed normal LV systolic function with mild left ventricular hypertrophy.  Outpatient monitor in 2017 showed occasional PACs and PVCs and short runs of atrial tachycardia. She was seen last year for atypical chest pain.  She underwent cardiac CTA in June 2023 which showed mild nonobstructive disease.  Calcium score was 80. Echocardiogram showed normal LV systolic function and no significant valvular abnormalities.  On Sunday, he had an episode of substernal chest pain and tightness which lasted about 15 minutes.  No burning sensation.  After the episode, she noticed being fatigued and has been having increased shortness of breath since then. She saw her primary care physician and had an EKG done which showed new inferior T wave inversion.  She had labs done which showed normal troponin.  Past Medical History:  Diagnosis Date   Arthritis    Atypical chest pain    a. 03/2014 ETT: Ex time 4:10, Max HR 160 bpm, no acute st/t changes.   Breast cancer (HCC) 1986   s/p radiation.   Bright's disease    Hx   Diastolic dysfunction    a. 02/2014 Echo: EF  55-60%, no rwma, Gr1 DD.   Dizziness and giddiness    Fecal smearing    GERD (gastroesophageal reflux disease)    Heart murmur    as child   Insomnia, unspecified    Obesity, unspecified    Personal history of chemotherapy    Personal history of radiation therapy 1986   right breast ca   PONV (postoperative nausea and vomiting)    after cataract procedure   Postmenopausal bleeding    Psoriasis    Psoriatic arthropathy (HCC)    Reflux esophagitis    Right cataract    a. 01/2016 s/p cataract surgery.   Spasm of muscle    Unspecified hypothyroidism    Unspecified urinary incontinence    Unspecified vitamin D deficiency     Past Surgical History:  Procedure Laterality Date   BREAST EXCISIONAL BIOPSY Left 1990   X 2- neg   CARDIAC CATHETERIZATION  [facility]   [facility]; no stent   CATARACT EXTRACTION W/PHACO Right 02/04/2016   Procedure: CATARACT EXTRACTION PHACO AND INTRAOCULAR LENS PLACEMENT (IOC);  Surgeon: Galen Manila, MD;  Location: [facility];  Service: Ophthalmology;  Laterality: Right;  [place] 00:44 AP% 19.2 CDE 8.49 fluid pack lot # [id number]   CATARACT EXTRACTION W/PHACO Left 06/12/2018   Procedure: CATARACT EXTRACTION PHACO AND INTRAOCULAR LENS PLACEMENT (IOC);  Surgeon: Galen Manila, MD;  Location: [facility];  Service: Ophthalmology;  Laterality: Left;  [place] 00:56.8 AP% 15.8 CDE 8.95 Fluid Pack lot # [id number]   colonoscopy     COLONOSCOPY WITH PROPOFOL N/A 07/15/2016   Procedure: COLONOSCOPY WITH PROPOFOL;  Surgeon: Midge Minium, MD;  Location: [facility];  Service: Endoscopy;  Laterality: N/A;   ENDOMETRIAL BIOPSY     MASTECTOMY Right 1985   POLYPECTOMY N/A 07/15/2016   Procedure: POLYPECTOMY;  Surgeon: Midge Minium, MD;  Location: [facility];  Service: Endoscopy;  Laterality: N/A;     Current Outpatient Medications  Medication Sig Dispense Refill   cholecalciferol (VITAMIN D) 1000 units tablet Take 1,000 Units by mouth daily.      Clobetasol Propionate  (TEMOVATE) 0.05 % external spray Apply topically 2 (two) times daily. 59 mL 0   fluticasone (FLONASE) 50 MCG/ACT nasal spray Place 2 sprays into both nostrils daily. 16 g 2   levothyroxine (SYNTHROID) 75 MCG tablet Take 1 tablet (75 mcg total) by mouth daily. 90 tablet 1   loratadine (CLARITIN) 10 MG tablet Take 1 tablet (10 mg total) by mouth daily. 90 tablet 1   Multiple Vitamin (MULTIVITAMIN) tablet Take 1 tablet by mouth daily.     zinc gluconate 50 MG tablet Take 50 mg by mouth daily.     No current facility-administered medications for this visit.    Allergies:   Sulfa antibiotics    Social History:  The patient  reports that she quit smoking about 32 years ago. Her smoking use included cigarettes. She has a 10.00 pack-year smoking history. She has never used smokeless tobacco. She reports that she does not drink alcohol and does not use drugs.   Family History:  The patient's family history includes Arrhythmia in her father and sister; Breast cancer (age of onset: 46) in her paternal aunt and sister; Cancer (age of onset: 8) in her sister; Cervical cancer in her sister; Dementia in her mother; Heart disease in her father; Hypertension in her mother; Lung cancer in her mother; Stroke in her mother; Uterine cancer in her mother.    ROS:  Please see the history of present illness.   Otherwise, review of systems are positive for none.   All other systems are reviewed and negative.    PHYSICAL EXAM: VS:  BP (!) 138/90 (BP Location: Left Arm, Patient Position: Sitting, Cuff Size: Normal)   Pulse 78   Ht 5\' 7"  (1.702 m)   Wt 191 lb 8 oz (86.9 kg)   SpO2 97%   BMI 29.99 kg/m  , BMI Body mass index is 29.99 kg/m. GEN: Well nourished, well developed, in no acute distress  HEENT: normal  Neck: no JVD, carotid bruits, or masses Cardiac: RRR; no murmurs, rubs, or gallops,no edema  Respiratory:  clear to auscultation bilaterally, normal work of breathing GI: soft, nontender,  nondistended, + BS MS: no deformity or atrophy  Skin: warm and dry, no rash Neuro:  Strength and sensation are intact Psych: euthymic mood, full affect Radial pulses normal.  EKG:  EKG is not ordered today. I reviewed recent EKG done on May 14 which showed sinus rhythm with inferior T wave changes suggestive of ischemia.   Recent Labs: 03/21/2023: ALT 13; BUN 15; Creat 0.68; Hemoglobin 13.2; Platelets 247; Potassium 4.5; Sodium 142; TSH 1.07    Lipid Panel    Component Value Date/Time   CHOL 159 03/21/2023 0938   TRIG 101 03/21/2023 0938   HDL 81 03/21/2023 0938   CHOLHDL 2.0 03/21/2023 0938   LDLCALC 60 03/21/2023 1478  Wt Readings from Last 3 Encounters:  03/24/23 191 lb 8 oz (86.9 kg)  03/21/23 191 lb (86.6 kg)  01/18/23 193 lb 12.8 oz (87.9 kg)          03/31/2022    9:29 AM  PAD Screen  Previous PAD dx? No  Previous surgical procedure? No  Pain with walking? No  Feet/toe relief with dangling? No  Painful, non-healing ulcers? No  Extremities discolored? No      ASSESSMENT AND PLAN:  1.  Unstable angina: The patient's symptoms are highly suggestive of unstable angina.  Recent EKG showed inferior T wave changes suggestive of ischemia.  She is at high risk for coronary artery disease in spite of negative workup in the past.  She had extensive radiation treatment to the chest and also is known to have psoriasis with increased inflammatory state. I recommend proceeding with urgent left heart catheterization and possible PCI.  I discussed the procedure in details as well as risks and benefits.  2.  Previous palpitations: Previous monitor showed rare PACs and PVCs.  No recurrent episodes.  3.  Pulmonary nodule: Followed by pulmonary.  The patient had recent lipid profile which showed an LDL of 60.  If cardiac catheterization shows significant coronary artery disease, she will require treatment with a statin regardless.   Disposition: Proceed with urgent left  heart catheterization and follow-up after.  Signed,  Lorine Bears, MD  03/24/2023 10:05 AM    [facility]

## 2023-03-24 NOTE — Patient Instructions (Addendum)
Medication Instructions:  START Aspirin 81 mg once daily  *If you need a refill on your cardiac medications before your next appointment, please call your pharmacy*   Lab Work: None ordered If you have labs (blood work) drawn today and your tests are completely normal, you will receive your results only by: MyChart Message (if you have MyChart) OR A paper copy in the mail If you have any lab test that is abnormal or we need to change your treatment, we will call you to review the results.   Testing/Procedures: Your physician has requested that you have a cardiac catheterization. Cardiac catheterization is used to diagnose and/or treat various heart conditions. Doctors may recommend this procedure for a number of different reasons. The most common reason is to evaluate chest pain. Chest pain can be a symptom of coronary artery disease (CAD), and cardiac catheterization can show whether plaque is narrowing or blocking your heart's arteries. This procedure is also used to evaluate the valves, as well as measure the blood flow and oxygen levels in different parts of your heart. For further information please visit [email or web address] Please follow instruction sheet, as given.    Follow-Up: At [facility], you and your health needs are our priority.  As part of our continuing mission to provide you with exceptional heart care, we have created designated Provider Care Teams.  These Care Teams include your primary Cardiologist (physician) and Advanced Practice Providers (APPs -  Physician Assistants and Nurse Practitioners) who all work together to provide you with the care you need, when you need it.  We recommend signing up for the patient portal called "MyChart".  Sign up information is provided on this After Visit Summary.  MyChart is used to connect with patients for Virtual Visits (Telemedicine).  Patients are able to view lab/test results, encounter notes, upcoming appointments, etc.   Non-urgent messages can be sent to your provider as well.   To learn more about what you can do with MyChart, go to [email or web address].    Your next appointment:   1 month(s)  Provider:   You may see Dr. Kirke Corin or one of the following Advanced Practice Providers on your designated Care Team:   Nicolasa Ducking, NP Eula Listen, PA-C Cadence Fransico Michael, PA-C Charlsie Quest, NP    Other Instructions  [facility] A DEPT OF [place]. [facility] AT [facility] [address] [place] [address] Dept: [phone] Loc: [phone]  DENIM MAILLARD  03/24/2023  You are scheduled for a Cardiac Catheterization on Friday, May 24 with Dr. Lorine Bears.  1. Please arrive at the [facility]ascular Center Entrance of [facility], [address], [place] [address] at 11:30 AM (This is 1 hour(s) prior to your procedure time).  Proceed to the Check-In Desk directly inside the entrance.  Procedure Parking: Use the entrance off of the [address] side of the hospital. Turn right upon entering and follow the driveway to parking that is directly in front of the Heart & Vascular Center. There is no valet parking available at this entrance, however there is an awning directly in front of the Heart & Vascular Center for drop off/ pick up for patients.  Special note: Every effort is made to have your procedure done on time. Please understand that emergencies sometimes delay scheduled procedures.  2. Diet: Do not eat solid foods after midnight.  The patient may have clear liquids until 5am upon the day  of the procedure.  3. Labs: Labs completed  4. Medication instructions in preparation for your procedure: Nothing to hold  On the morning of your procedure, take your Aspirin 81 mg and any morning medicines NOT listed above.  You may use sips of water.  5. Plan to go home the same day, you will only stay overnight if medically  necessary. 6. Bring a current list of your medications and current insurance cards. 7. You MUST have a responsible person to drive you home. 8. Someone MUST be with you the first 24 hours after you arrive home or your discharge will be delayed. 9. Please wear clothes that are easy to get on and off and wear slip-on shoes.  Thank you for allowing [place] to care for you!   -- [facility]

## 2023-03-30 ENCOUNTER — Telehealth: Payer: Self-pay | Admitting: *Deleted

## 2023-03-30 NOTE — Telephone Encounter (Signed)
The patient's cath has been rescheduled to 5/31 with arrival time of 9 am. The patient has verbalized her understanding and will be called back if anything changes.

## 2023-03-30 NOTE — Telephone Encounter (Signed)
Left a message for the patient to come back. Her cath scheduled for tomorrow will have to be rescheduled due to not having approval from her insurance as of yet.

## 2023-03-31 DIAGNOSIS — I200 Unstable angina: Secondary | ICD-10-CM

## 2023-04-05 ENCOUNTER — Telehealth: Payer: Self-pay | Admitting: Cardiovascular Disease

## 2023-04-05 ENCOUNTER — Encounter: Payer: Self-pay | Admitting: Cardiovascular Disease

## 2023-04-05 NOTE — Telephone Encounter (Signed)
Case # [id number]  Spoke to [facility] with [facility] and the patient regarding the authorization for this patient's heart cath on 05/31. Angelique Blonder does not see where the authorization is pending whereas the patient was told via MyChart that it was still pending. Please give [employer]'s authorization department a call back to follow up at [phone]. Then call patient with update at [phone]

## 2023-04-06 ENCOUNTER — Telehealth: Payer: Self-pay | Admitting: *Deleted

## 2023-04-06 DIAGNOSIS — R072 Precordial pain: Secondary | ICD-10-CM

## 2023-04-06 NOTE — Telephone Encounter (Signed)
The patient has been made aware that the cath has been denied. This has been canceled.   She stated that she is concerned because when she called, [employer] did not have a record of the cardiac cath.

## 2023-04-06 NOTE — Telephone Encounter (Signed)
-----   Message from Francine Graven sent at 04/06/2023  2:27 PM EDT ----- Regarding: AUTH DENIAL Hello,   [employer] has denied pt's auth for left Heart Cath. Unfortunately, [facility] does not give a detailed report of why procedure is denied. Is there any way to get appointment taken off schedule until we find a resolve?  Thanks,  [employer]

## 2023-04-07 ENCOUNTER — Encounter: Admission: RE | Payer: Self-pay | Source: Home / Self Care

## 2023-04-07 ENCOUNTER — Ambulatory Visit: Admission: RE | Admit: 2023-04-07 | Payer: Medicare PPO | Source: Home / Self Care | Admitting: Cardiovascular Disease

## 2023-04-07 DIAGNOSIS — I200 Unstable angina: Secondary | ICD-10-CM

## 2023-04-07 SURGERY — LEFT HEART CATH AND CORONARY ANGIOGRAPHY
Anesthesia: Moderate Sedation | Laterality: Left

## 2023-04-10 ENCOUNTER — Encounter: Payer: Self-pay | Admitting: Cardiovascular Disease

## 2023-04-11 NOTE — Telephone Encounter (Signed)
Schedule a Lexiscan Myoview instead.

## 2023-04-11 NOTE — Addendum Note (Signed)
Addended by: Sandi Mariscal on: 04/11/2023 10:23 AM   Modules accepted: Orders

## 2023-04-14 ENCOUNTER — Ambulatory Visit
Admission: RE | Admit: 2023-04-14 | Discharge: 2023-04-14 | Disposition: A | Discharge status: Home / Self Care | Payer: Medicare PPO | Source: Ambulatory Visit | Attending: Pulmonary Disease | Admitting: Pulmonary Disease

## 2023-04-14 DIAGNOSIS — I700 Atherosclerosis of aorta: Secondary | ICD-10-CM | POA: Diagnosis not present

## 2023-04-14 DIAGNOSIS — R911 Solitary pulmonary nodule: Secondary | ICD-10-CM | POA: Diagnosis not present

## 2023-04-14 NOTE — Addendum Note (Signed)
Encounter addended by: Dorthea Cove on: 04/14/2023 11:47 AM  Actions taken: Imaging Exam ended

## 2023-04-17 ENCOUNTER — Encounter
Admission: RE | Admit: 2023-04-17 | Discharge: 2023-04-17 | Disposition: A | Discharge status: Home / Self Care | Payer: Medicare PPO | Source: Ambulatory Visit | Attending: Cardiovascular Disease | Admitting: Cardiovascular Disease

## 2023-04-17 DIAGNOSIS — R072 Precordial pain: Secondary | ICD-10-CM | POA: Insufficient documentation

## 2023-04-17 LAB — NM MYOCAR MULTI W/SPECT W/WALL MOTION / EF
LV dias vol: 67 mL (ref 46–106)
LV sys vol: 23 mL
Nuc Stress EF: 66 %
Peak HR: 102 {beats}/min
Percent HR: 69 %
Rest HR: 69 {beats}/min
Rest Nuclear Isotope Dose: 10.2 mCi
SDS: 1
SRS: 3
SSS: 2
ST Depression (mm): 0 mm
Stress Nuclear Isotope Dose: 32 mCi
TID: 0.88

## 2023-04-17 MED ORDER — TECHNETIUM TC 99M TETROFOSMIN IV KIT
10.0000 | PACK | Freq: Once | INTRAVENOUS | Status: AC | PRN
  Administered 2023-04-17: 10.19 via INTRAVENOUS

## 2023-04-17 MED ORDER — REGADENOSON 0.4 MG/5ML IV SOLN
0.4000 mg | Freq: Once | INTRAVENOUS | Status: AC
  Administered 2023-04-17: 0.4 mg via INTRAVENOUS

## 2023-04-17 MED ORDER — TECHNETIUM TC 99M TETROFOSMIN IV KIT
32.0200 | PACK | Freq: Once | INTRAVENOUS | Status: AC | PRN
  Administered 2023-04-17: 32.02 via INTRAVENOUS

## 2023-04-19 ENCOUNTER — Other Ambulatory Visit: Payer: Self-pay | Admitting: Family Medicine

## 2023-04-19 DIAGNOSIS — E039 Hypothyroidism, unspecified: Secondary | ICD-10-CM

## 2023-04-20 ENCOUNTER — Telehealth: Payer: Self-pay | Admitting: Pulmonary Disease

## 2023-04-20 DIAGNOSIS — R911 Solitary pulmonary nodule: Secondary | ICD-10-CM

## 2023-04-20 NOTE — Telephone Encounter (Signed)
[name], CMA 04/20/2023  3:38 PM EDT Back to Top    I have notified the patient and ordered her CT to be done in 6 months.   Nothing further needed.   [name], CMA 04/20/2023 11:46 AM EDT     Lm x1 for the patient.   Nicole Saner, MD 04/20/2023 11:01 AM EDT     CT shows that the nodule is stable.  Would recommend repeating CT in 6 months.

## 2023-04-20 NOTE — Telephone Encounter (Signed)
Patient stated she is returning a call.  Please call patient back at [phone]

## 2023-04-26 ENCOUNTER — Ambulatory Visit: Payer: Medicare PPO | Admitting: Pulmonary Disease

## 2023-04-27 ENCOUNTER — Encounter: Payer: Self-pay | Admitting: Nurse Practitioner

## 2023-04-27 ENCOUNTER — Ambulatory Visit: Payer: Medicare PPO | Attending: Nurse Practitioner | Admitting: Cardiology

## 2023-04-27 VITALS — BP 136/84 | HR 73 | Ht 67.0 in | Wt 191.0 lb

## 2023-04-27 DIAGNOSIS — I2510 Atherosclerotic heart disease of native coronary artery without angina pectoris: Secondary | ICD-10-CM

## 2023-04-27 DIAGNOSIS — R002 Palpitations: Secondary | ICD-10-CM

## 2023-04-27 DIAGNOSIS — I700 Atherosclerosis of aorta: Secondary | ICD-10-CM

## 2023-04-27 DIAGNOSIS — R0602 Shortness of breath: Secondary | ICD-10-CM

## 2023-04-27 NOTE — Progress Notes (Signed)
Office Visit    Patient Name: Nicole Contreras Date of Encounter: 04/27/2023  Primary Care Provider:  Alba Cory, MD Primary Cardiologist:  Lorine Bears, MD  Chief Complaint    77 year old female with past medical history of nonobstructive CAD, mild aortic atherosclerosis, hypothyroidism, GERD, psoriatic arthritis, breast cancer with radiation and chemotherapy in 1986. She is here today for follow up regarding chest pain and nonobstructive CAD.   Past Medical History    Past Medical History:  Diagnosis Date   Arthritis    Atypical chest pain    a. 03/2014 ETT: Ex time 4:10, Max HR 160 bpm, no acute st/t changes.   Breast cancer (HCC) 1986   s/p radiation.   Bright's disease    Hx   Diastolic dysfunction    a. 02/2014 Echo: EF 55-60%, no rwma, Gr1 DD.   Dizziness and giddiness    Fecal smearing    GERD (gastroesophageal reflux disease)    Heart murmur    as child   Insomnia, unspecified    Obesity, unspecified    Personal history of chemotherapy    Personal history of radiation therapy 1986   right breast ca   PONV (postoperative nausea and vomiting)    after cataract procedure   Postmenopausal bleeding    Psoriasis    Psoriatic arthropathy (HCC)    Reflux esophagitis    Right cataract    a. 01/2016 s/p cataract surgery.   Spasm of muscle    Unspecified hypothyroidism    Unspecified urinary incontinence    Unspecified vitamin D deficiency    Past Surgical History:  Procedure Laterality Date   BREAST EXCISIONAL BIOPSY Left 1990   X 2- neg   CARDIAC CATHETERIZATION  90's   [facility]; no stent   CATARACT EXTRACTION W/PHACO Right 02/04/2016   Procedure: CATARACT EXTRACTION PHACO AND INTRAOCULAR LENS PLACEMENT (IOC);  Surgeon: Galen Manila, MD;  Location: [facility];  Service: Ophthalmology;  Laterality: Right;  [place] 00:44 AP% 19.2 CDE 8.49 fluid pack lot # [id number]   CATARACT EXTRACTION W/PHACO Left 06/12/2018   Procedure: CATARACT EXTRACTION PHACO AND  INTRAOCULAR LENS PLACEMENT (IOC);  Surgeon: Galen Manila, MD;  Location: [facility];  Service: Ophthalmology;  Laterality: Left;  [place] 00:56.8 AP% 15.8 CDE 8.95 Fluid Pack lot # [id number]   colonoscopy     COLONOSCOPY WITH PROPOFOL N/A 07/15/2016   Procedure: COLONOSCOPY WITH PROPOFOL;  Surgeon: Midge Minium, MD;  Location: [facility];  Service: Endoscopy;  Laterality: N/A;   ENDOMETRIAL BIOPSY     MASTECTOMY Right 1985   POLYPECTOMY N/A 07/15/2016   Procedure: POLYPECTOMY;  Surgeon: Midge Minium, MD;  Location: [facility];  Service: Endoscopy;  Laterality: N/A;   Allergies Allergies  Allergen Reactions   Sulfa Antibiotics Rash   Labs/Other Studies Reviewed    The following studies were reviewed today: Cardiac Studies & Procedures     STRESS TESTS  NM MYOCAR MULTI W/SPECT W 04/17/2023  Narrative   The study is normal. The study is low risk.   No ST deviation was noted.   LV perfusion is normal. There is no evidence of ischemia. There is no evidence of infarction.   Left ventricular function is normal. Nuclear stress EF: 66 %. End diastolic cavity size is normal. End systolic cavity size is normal.   CT attenuation images showed mild aortic calcifications and no coronary calcifications.   ECHOCARDIOGRAM  ECHOCARDIOGRAM COMPLETE 04/18/2022  Narrative ECHOCARDIOGRAM REPORT    Patient  Name:   Nicole Contreras Date of Exam: 04/18/2022 Medical Rec #:  [id number]         Height:       67.0 in Accession #:    [id number]        Weight:       188.4 lb Date of Birth:  1946-08-14        BSA:          1.972 m Patient Age:    77 years          BP:           142/76 mmHg Patient Gender: F                 HR:           82 bpm. Exam Location:    Procedure: 2D Echo, Cardiac Doppler, Color Doppler and Strain Analysis  Indications:    R06.02 SOB; R07.9* Chest pain, unspecified  History:        Patient has prior history of Echocardiogram examinations, most recent  03/04/2014. Signs/Symptoms:Chest Pain, Shortness of Breath and Dizziness/Lightheadedness; Risk Factors:Former Smoker.  Sonographer:    Quentin Ore RDMS, RVT, RDCS Referring Phys: [id number] [facility]   Sonographer Comments: Suboptimal parasternal window. IMPRESSIONS   1. Left ventricular ejection fraction, by estimation, is 55 to 60%. Left ventricular ejection fraction by 3D volume is 56 %. The left ventricle has normal function. The left ventricle has no regional wall motion abnormalities. Left ventricular diastolic parameters are consistent with Grade I diastolic dysfunction (impaired relaxation). The average left ventricular global longitudinal strain is -16.8 %. 2. Right ventricular systolic function is normal. The right ventricular size is normal. 3. The mitral valve is normal in structure. Trivial mitral valve regurgitation. 4. The aortic valve is tricuspid. Aortic valve regurgitation is not visualized. 5. The inferior vena cava is normal in size with greater than 50% respiratory variability, suggesting right atrial pressure of 3 mmHg.  FINDINGS Left Ventricle: Left ventricular ejection fraction, by estimation, is 55 to 60%. Left ventricular ejection fraction by 3D volume is 56 %. The left ventricle has normal function. The left ventricle has no regional wall motion abnormalities. The average left ventricular global longitudinal strain is -16.8 %. The left ventricular internal cavity size was normal in size. There is no left ventricular hypertrophy. Left ventricular diastolic parameters are consistent with Grade I diastolic dysfunction (impaired relaxation).  Right Ventricle: The right ventricular size is normal. No increase in right ventricular wall thickness. Right ventricular systolic function is normal.  Left Atrium: Left atrial size was normal in size.  Right Atrium: Right atrial size was normal in size.  Pericardium: There is no evidence of pericardial effusion.  Mitral  Valve: The mitral valve is normal in structure. Trivial mitral valve regurgitation.  Tricuspid Valve: The tricuspid valve is normal in structure. Tricuspid valve regurgitation is not demonstrated.  Aortic Valve: The aortic valve is tricuspid. Aortic valve regurgitation is not visualized. Aortic valve mean gradient measures 3.0 mmHg. Aortic valve peak gradient measures 6.5 mmHg. Aortic valve area, by VTI measures 2.29 cm.  Pulmonic Valve: The pulmonic valve was normal in structure. Pulmonic valve regurgitation is trivial.  Aorta: The aortic root is normal in size and structure.  Venous: The inferior vena cava is normal in size with greater than 50% respiratory variability, suggesting right atrial pressure of 3 mmHg.  IAS/Shunts: No atrial level shunt detected by color flow Doppler.   LEFT VENTRICLE PLAX  2D LVIDd:         4.40 cm         Diastology LVIDs:         3.20 cm         LV e' medial:    4.90 cm/s LV PW:         1.00 cm         LV E/e' medial:  14.5 LV IVS:        1.20 cm         LV e' lateral:   5.44 cm/s LVOT diam:     2.00 cm         LV E/e' lateral: 13.1 LV SV:         62 LV SV Index:   31              2D LVOT Area:     3.14 cm        Longitudinal Strain 2D Strain GLS  -16.8 % LV Volumes (MOD)               Avg: LV vol d, MOD    79.9 ml A4C:                           3D Volume EF LV vol s, MOD    35.4 ml       LV 3D EF:    Left A4C:                                        ventricul LV SV MOD A4C:   79.9 ml                    ar ejection fraction by 3D volume is 56 %.  3D Volume EF: 3D EF:        56 % LV EDV:       135 ml LV ESV:       59 ml LV SV:        76 ml  RIGHT VENTRICLE             IVC RV S prime:     12.90 cm/s  IVC diam: 1.20 cm TAPSE (M-mode): 2.6 cm  LEFT ATRIUM             Index        RIGHT ATRIUM           Index LA diam:        3.60 cm 1.83 cm/m   RA Area:     17.50 cm LA Vol (A2C):   40.1 ml 20.34 ml/m  RA Volume:   44.40 ml  22.52  ml/m LA Vol (A4C):   32.8 ml 16.64 ml/m LA Biplane Vol: 36.8 ml 18.67 ml/m AORTIC VALVE                    PULMONIC VALVE AV Area (Vmax):    2.23 cm     PV Vmax:       1.22 m/s AV Area (Vmean):   2.27 cm     PV Peak grad:  6.0 mmHg AV Area (VTI):     2.29 cm AV Vmax:           127.00 cm/s AV Vmean:  83.400 cm/s AV VTI:            0.269 m AV Peak Grad:      6.5 mmHg AV Mean Grad:      3.0 mmHg LVOT Vmax:         90.10 cm/s LVOT Vmean:        60.200 cm/s LVOT VTI:          0.196 m LVOT/AV VTI ratio: 0.73  AORTA Ao Root diam: 3.10 cm Ao Asc diam:  2.80 cm Ao Arch diam: 2.3 cm  MITRAL VALVE MV Area (PHT): 3.13 cm     SHUNTS MV Decel Time: 242 msec     Systemic VTI:  0.20 m MV E velocity: 71.10 cm/s   Systemic Diam: 2.00 cm MV A velocity: 112.00 cm/s MV E/A ratio:  0.63  Debbe Odea MD Electronically signed by Debbe Odea MD Signature Date/Time: 04/18/2022/5:17:31 PM    Final    MONITORS  CARDIAC EVENT MONITOR 10/14/2016  Narrative Normal sinus rhythm with no evidence of atrial fibrillation. Occasional PACs and PVCs. Very short run of atrial tachycardia. Overall, no significant arrhythmia.   CT SCANS  CT CORONARY MORPH W/CTA COR W/SCORE 04/07/2022  Addendum 04/07/2022  5:01 PM ADDENDUM REPORT: 04/07/2022 16:58  EXAM: OVER-READ INTERPRETATION  CT CHEST  The following report is an over-read performed by radiologist Dr. Jacob Moores [facility] on 04/07/2022. This over-read does not include interpretation of cardiac or coronary anatomy or pathology. The Cardiac/Coronary CTA interpretation by the cardiologist is attached.  COMPARISON:  None.  FINDINGS: Vascular: Normal heart size. Pericardial effusion. Normal caliber thoracic aorta with minimal soft plaque.  Mediastinum/Nodes: Small hiatal hernia. No pathologically enlarged lymph nodes seen in the chest.  Lungs/Pleura: Central airways are patent. Juxtapleural  reticular opacities of the anterior right middle lobe, likely post radiation change. No consolidation, pleural effusion or pneumothorax. Ground-glass nodule of the left lower lobe measuring 16 x 10 mm on series 20, image 2 with small solid component measuring 4 mm on image 1.  Upper Abdomen: No acute abnormality.  Musculoskeletal: No chest wall mass or suspicious bone lesions identified.  IMPRESSION: Part solid nodule of the left lower lobe measuring 18 mm in overall diameter with 4 mm solid component. Follow-up non-contrast CT recommended at 3-6 months to confirm persistence. If unchanged, and solid component remains <6 mm, annual CT is recommended until 5 years of stability has been established. If persistent these nodules should be considered highly suspicious if the solid component of the nodule is 6 mm or greater in size and enlarging. This recommendation follows the consensus statement: Guidelines for Management of Incidental Pulmonary Nodules Detected on CT Images: From the Fleischner Society 2017; Radiology 2017; 284:228-243.   Electronically Signed By: Allegra Lai M.D. On: 04/07/2022 16:58  Narrative CLINICAL DATA:  Chest pain  EXAM: Cardiac/Coronary  CTA  TECHNIQUE: The patient was scanned on a Siemens Somatom go.Top scanner.  : A retrospective scan was triggered in the descending thoracic aorta. Axial non-contrast 3 mm slices were carried out through the heart. The data set was analyzed on a dedicated work station and scored using the Agatson method. Gantry rotation speed was 330 msecs and collimation was .6 mm. 100mg  of metoprolol and 0.8 mg of sl NTG was given. The 3D data set was reconstructed in 5% intervals of the 60-95 % of the R-R cycle. Diastolic phases were analyzed on a dedicated work station using MPR, MIP and VRT modes. The patient  received 75 cc of contrast.  FINDINGS: Aorta:  Normal size.  No calcifications.  No dissection.  Aortic  Valve:  Trileaflet.  No calcifications.  Coronary Arteries:  Normal coronary origin.  Right dominance.  RCA is a dominant artery that gives rise to PDA and PLA. There is no plaque.  Left main gives rise to LAD and LCX arteries. There is no LM disease.  LAD has calcified plaque causing mild proximal LAD stenosis (25%).  LCX is a non-dominant artery that gives rise to a small OM branch and a PL branch. There is no plaque.  Other findings:  Normal pulmonary vein drainage into the left atrium.  Normal left atrial appendage without a thrombus.  Normal size of the pulmonary artery.  IMPRESSION: 1. Coronary calcium score of 80.2. This was 62nd percentile for age and sex matched control.  2. Normal coronary origin with right dominance.  3. Mild proximal LAD stenosis (25%).  4. CAD-RADS 2. Mild non-obstructive CAD (25-49%). Consider non-atherosclerotic causes of chest pain. Consider preventive therapy and risk factor modification.  Electronically Signed: By: Debbe Odea M.D. On: 04/07/2022 15:34         Recent Labs: 03/21/2023: ALT 13; BUN 15; Creat 0.68; Hemoglobin 13.2; Platelets 247; Potassium 4.5; Sodium 142; TSH 1.07  Recent Lipid Panel    Component Value Date/Time   CHOL 159 03/21/2023 0938   TRIG 101 03/21/2023 0938   HDL 81 03/21/2023 0938   CHOLHDL 2.0 03/21/2023 0938   LDLCALC 60 03/21/2023 0938   History of Present Illness    77 year old female with past medical history of nonobstructive CAD, mild aortic atherosclerosis, hypothyroidism, GERD, breast cancer with radiation and chemotherapy in 1986.   Ms. Bassi was previously seen in office in 2015 and 2017 for atypical chest pain. Her echo in 2015 showed normal LV systolic function with mild left ventricular hypertrophy. In 2017 she had a nuclear stress test that showed no evidence of ischemia with normal EF. Cardiac monitor in 2017 showed occasional PACs and PVCs and short runs of atrial tachycardia. In  June 2023 she underwent cardiac CTA that showed mild nonobstructive disease, her calcium score was 90.   In May, 2024 she had an episode of substernal chest pain and tightness, lasted for 15 minutes. Following the episode she notes increased fatigue and shortness of breath. An EKG at her PCP office showed new inferior T wave inversion, she had normal troponin levels. Dr. Kirke Corin initially planned for urgent left heart catheterization, due to insurance she underwent myoview instead. Her study indicated low risk, no ST deviation was noted, there was no evidence of ischemia or infarction. LV perfusion and function was normal with an EF of 66%. Her CT images indicated mild aortic calcifications and no coronary calcifications.   Today she reports she is doing well overall. She notes episode of chest pain on Sunday, was sitting in the car and had sudden onset of substernal chest pain that lasted for ten minutes. She did not experience the shortness of breath or fatigue she experienced after her May event. Notes some dyspnea on exertion, feels this is related to conditioning and weight gain. Saturday she was gardening in her yard and had no anginal symptoms. She denies palpitations, pnd, orthopnea, n, v, dizziness, syncope, or early satiety. Endorses some slight ankle edema but notes this started yesterday after mowing over a bees nest resulting in multiple bee stings to the ankles.   Home Medications    Current Outpatient  Medications  Medication Sig Dispense Refill   aspirin EC 81 MG tablet Take 1 tablet (81 mg total) by mouth daily. Swallow whole. 90 tablet 3   cholecalciferol (VITAMIN D) 1000 units tablet Take 1,000 Units by mouth daily.      Clobetasol Propionate (TEMOVATE) 0.05 % external spray Apply topically 2 (two) times daily. 59 mL 0   fluticasone (FLONASE) 50 MCG/ACT nasal spray Place 2 sprays into both nostrils daily. 16 g 2   levothyroxine (SYNTHROID) 75 MCG tablet Take 1 tablet (75 mcg total) by  mouth daily. 90 tablet 0   loratadine (CLARITIN) 10 MG tablet Take 1 tablet (10 mg total) by mouth daily. 90 tablet 1   Multiple Vitamin (MULTIVITAMIN) tablet Take 1 tablet by mouth daily.     zinc gluconate 50 MG tablet Take 50 mg by mouth daily.     No current facility-administered medications for this visit.     Review of Systems    She denies palpitations, pnd, orthopnea, n, v, dizziness, syncope, or early satiety. All other systems reviewed and are otherwise negative except as noted above.      Physical Exam    VS:  BP 136/84   Pulse 73   Ht 5\' 7"  (1.702 m)   Wt 191 lb (86.6 kg)   SpO2 97%   BMI 29.91 kg/m  , BMI Body mass index is 29.91 kg/m.  GEN: Well nourished, well developed, in no acute distress. HEENT: normal. Neck: Supple, no JVD, carotid bruits, or masses. Cardiac: RRR, no murmurs, rubs, or gallops. No clubbing, cyanosis, edema.  Radials/DP/PT 2+ and equal bilaterally.  Respiratory:  Respirations regular and unlabored, clear to auscultation bilaterally. GI: Soft, nontender, nondistended, BS + x 4. MS: no deformity or atrophy. Skin: warm and dry. Bee stings noted on bilateral ankles.  Neuro:  Strength and sensation are intact. Psych: Normal affect.  Accessory Clinical Findings    ECG personally reviewed by me today - EKG today shows normal sinus rhythm at 73bpm with minimal voltage criteria for LVH (R in aVL), with nonspecific T wave abnormalities, T wave inversions previously noted on 5/14 no longer present   Lab Results  Component Value Date   WBC 7.2 03/21/2023   HGB 13.2 03/21/2023   HCT 39.5 03/21/2023   MCV 83.9 03/21/2023   PLT 247 03/21/2023   Lab Results  Component Value Date   CREATININE 0.68 03/21/2023   BUN 15 03/21/2023   NA 142 03/21/2023   K 4.5 03/21/2023   CL 106 03/21/2023   CO2 27 03/21/2023   Lab Results  Component Value Date   ALT 13 03/21/2023   AST 21 03/21/2023   ALKPHOS 82 05/30/2016   BILITOT 0.4 03/21/2023   Lab  Results  Component Value Date   CHOL 159 03/21/2023   HDL 81 03/21/2023   LDLCALC 60 03/21/2023   TRIG 101 03/21/2023   CHOLHDL 2.0 03/21/2023    Lab Results  Component Value Date   HGBA1C 5.5 08/08/2017    Assessment & Plan    Nonobstructive CAD/Chest pain: Previously noted on coronary CTA in 2023, LAD noted to have calcified plaque causing mild proximal LAD stenosis (25%). Her coronary calcium score was 80.2, this was 62nd percentile for age and sex matched control. In May she had an episodes of substernal chest pain that lasted for 15 minutes, EKG showed new inferior T wave inversion, she noted fatigue and shortness of breath in the day following the episode. Dr. Kirke Corin  planned for urgent cardiac cath but due to insurance she had Myoview stress testing that indicated no ischemia or infarction. Today reports one episode of chest pain on Sunday that occurred at rest that lasted 10 minutes and self resolved. She did not experience shortness of breath or fatigue following this event. Her EKG today shows normal sinus rhythm at 73bpm with minimal voltage criteria for LVH (R in aVL), with nonspecific T wave abnormalities, T wave inversions previously noted on 5/14 no longer present. In light of stress test indicating no ischemia and overall improvement in symptoms will plan for conservative management. Her last lipid panel in May indicated LDL of 60 and total cholesterol of 159, she previously discussed with her PCP starting on statin therapy, patient declined.  Initial blood pressure today 138/90 with repeat being 136/84. Discussed potentially starting on anti-hypertensive, she deferred at this time, will monitor her blood pressures at home and work on improving diet and exercise.   Hypertension: Initial blood pressure today 138/90 with repeat being 136/84. Notes her blood pressure at home run in the 130's/80's, will occasionally run systolic 140's. Discussed starting on anti-hypertensive, she deferred  at this time, will monitor her blood pressures at home and work on improving diet and exercise. She will notify office if her blood pressure is consistently elevated above 140/90.   History of palpitations: Monitor from 2017 showed occasional PACs and PVCs and short runs of atrial tachycardia. Denies current palpitations.   Mild Aortic calcifications: Noted on CT images from coronary CTA. Continue aspirin 81mg . Her last lipid panel in May indicated LDL of 60 and total cholesterol of 159, previously discussed with her PCP starting on statin therapy, patient declined. Plan for further discussion at follow up.   Follow up in 2 months.      Rip Harbour, NP 04/27/2023, 12:20 PM

## 2023-04-27 NOTE — Patient Instructions (Signed)
Medication Instructions:  Your physician recommends that you continue on your current medications as directed. Please refer to the Current Medication list given to you today.  *If you need a refill on your cardiac medications before your next appointment, please call your pharmacy*   Lab Work: none If you have labs (blood work) drawn today and your tests are completely normal, you will receive your results only by: MyChart Message (if you have MyChart) OR A paper copy in the mail If you have any lab test that is abnormal or we need to change your treatment, we will call you to review the results.   Testing/Procedures: none   Follow-Up: At [facility], you and your health needs are our priority.  As part of our continuing mission to provide you with exceptional heart care, we have created designated Provider Care Teams.  These Care Teams include your primary Cardiologist (physician) and Advanced Practice Providers (APPs -  Physician Assistants and Nurse Practitioners) who all work together to provide you with the care you need, when you need it.  We recommend signing up for the patient portal called "MyChart".  Sign up information is provided on this After Visit Summary.  MyChart is used to connect with patients for Virtual Visits (Telemedicine).  Patients are able to view lab/test results, encounter notes, upcoming appointments, etc.  Non-urgent messages can be sent to your provider as well.   To learn more about what you can do with MyChart, go to [email or web address].    Your next appointment:   2 month(s)  Provider:   Lorine Bears, MD or Nicolasa Ducking, NP

## 2023-05-25 DIAGNOSIS — H902 Conductive hearing loss, unspecified: Secondary | ICD-10-CM | POA: Diagnosis not present

## 2023-05-25 DIAGNOSIS — H6123 Impacted cerumen, bilateral: Secondary | ICD-10-CM | POA: Diagnosis not present

## 2023-07-05 ENCOUNTER — Ambulatory Visit: Payer: Medicare PPO | Attending: Nurse Practitioner | Admitting: Nurse Practitioner

## 2023-07-05 ENCOUNTER — Encounter: Payer: Self-pay | Admitting: Nurse Practitioner

## 2023-07-05 VITALS — BP 136/88 | HR 76 | Ht 67.0 in | Wt 185.6 lb

## 2023-07-05 DIAGNOSIS — R002 Palpitations: Secondary | ICD-10-CM | POA: Diagnosis not present

## 2023-07-05 DIAGNOSIS — I2510 Atherosclerotic heart disease of native coronary artery without angina pectoris: Secondary | ICD-10-CM | POA: Diagnosis not present

## 2023-07-05 DIAGNOSIS — R030 Elevated blood-pressure reading, without diagnosis of hypertension: Secondary | ICD-10-CM | POA: Diagnosis not present

## 2023-07-05 DIAGNOSIS — R911 Solitary pulmonary nodule: Secondary | ICD-10-CM | POA: Diagnosis not present

## 2023-07-05 DIAGNOSIS — R072 Precordial pain: Secondary | ICD-10-CM | POA: Diagnosis not present

## 2023-07-05 NOTE — Patient Instructions (Signed)
Medication Instructions:  Your Physician recommend you continue on your current medication as directed.    *If you need a refill on your cardiac medications before your next appointment, please call your pharmacy*  Lab Work: None If you have labs (blood work) drawn today and your tests are completely normal, you will receive your results only by: MyChart Message (if you have MyChart) OR A paper copy in the mail If you have any lab test that is abnormal or we need to change your treatment, we will call you to review the results.  Testing/Procedures: None  Follow-Up: At [facility], you and your health needs are our priority.  As part of our continuing mission to provide you with exceptional heart care, we have created designated Provider Care Teams.  These Care Teams include your primary Cardiologist (physician) and Advanced Practice Providers (APPs -  Physician Assistants and Nurse Practitioners) who all work together to provide you with the care you need, when you need it.  We recommend signing up for the patient portal called "MyChart".  Sign up information is provided on this After Visit Summary.  MyChart is used to connect with patients for Virtual Visits (Telemedicine).  Patients are able to view lab/test results, encounter notes, upcoming appointments, etc.  Non-urgent messages can be sent to your provider as well.   To learn more about what you can do with MyChart, go to [email or web address].    Your next appointment:   6 month(s)  Provider:   You may see Lorine Bears, MD or one of the following Advanced Practice Providers on your designated Care Team:   Nicolasa Ducking, NP

## 2023-07-05 NOTE — Progress Notes (Signed)
Office Visit    Patient Name: Nicole Contreras Date of Encounter: 07/05/2023  Primary Care Provider:  Alba Cory, MD Primary Cardiologist:  Nicole Bears, MD  Chief Complaint    76 y.o. female with a history of atypical chest pain, nonobstructive CAD, diastolic dysfunction, palpitations, obesity, hypothyroidism, GERD, and breast cancer status post radiation, who presents for follow-up related to chest pain.  Past Medical History    Past Medical History:  Diagnosis Date   Arthritis    Atypical chest pain    a. 03/2014 ETT: Ex time 4:10, Max HR 160 bpm, no acute st/t changes.   Breast cancer (HCC) 1986   s/p radiation.   Bright's disease    Hx   Diastolic dysfunction    a. 02/2014 Echo: EF 55-60%, no rwma, Gr1 DD; b. 04/2022 Echo: EF 55-60%, no rwma, GrI DD, nl RV size/fxn, triv MR.   Dizziness and giddiness    Fecal smearing    GERD (gastroesophageal reflux disease)    Heart murmur    as child   Insomnia, unspecified    Non-obstructive CAD (coronary artery disease)    a. 04/2022 Cor CTA: Ca2+ = 80.2 (62nd%'ile). LAD 25-49%. Otherwise nl cors; b. 04/2023 MV: EF 66%, no ischemia/infarct.   Obesity, unspecified    Personal history of chemotherapy    Personal history of radiation therapy 1986   right breast ca   PONV (postoperative nausea and vomiting)    after cataract procedure   Postmenopausal bleeding    Psoriasis    Psoriatic arthropathy (HCC)    Reflux esophagitis    Right cataract    a. 01/2016 s/p cataract surgery.   Spasm of muscle    Unspecified hypothyroidism    Unspecified urinary incontinence    Unspecified vitamin D deficiency    Past Surgical History:  Procedure Laterality Date   BREAST EXCISIONAL BIOPSY Left 1990   X 2- neg   CARDIAC CATHETERIZATION  [facility]   [facility]; no stent   CATARACT EXTRACTION W/PHACO Right 02/04/2016   Procedure: CATARACT EXTRACTION PHACO AND INTRAOCULAR LENS PLACEMENT (IOC);  Surgeon: Nicole Manila, MD;  Location: [facility]  ORS;  Service: Ophthalmology;  Laterality: Right;  [place] 00:44 AP% 19.2 CDE 8.49 fluid pack lot # [id number]   CATARACT EXTRACTION W/PHACO Left 06/12/2018   Procedure: CATARACT EXTRACTION PHACO AND INTRAOCULAR LENS PLACEMENT (IOC);  Surgeon: Nicole Manila, MD;  Location: [facility] ORS;  Service: Ophthalmology;  Laterality: Left;  [place] 00:56.8 AP% 15.8 CDE 8.95 Fluid Pack lot # [id number]   colonoscopy     COLONOSCOPY WITH PROPOFOL N/A 07/15/2016   Procedure: COLONOSCOPY WITH PROPOFOL;  Surgeon: Nicole Minium, MD;  Location: [facility];  Service: Endoscopy;  Laterality: N/A;   ENDOMETRIAL BIOPSY     MASTECTOMY Right 1985   POLYPECTOMY N/A 07/15/2016   Procedure: POLYPECTOMY;  Surgeon: Nicole Minium, MD;  Location: [facility];  Service: Endoscopy;  Laterality: N/A;    Allergies  Allergies  Allergen Reactions   Sulfa Antibiotics Rash    History of Present Illness      77 y.o. y/o female with a history of atypical chest pain, nonobstructive CAD, diastolic dysfunction, palpitations, obesity, hypothyroidism, GERD, and breast cancer status post radiation.  She initially established care in 2015 in the setting of atypical chest pain, exercise treadmill test was unremarkable nuclear stress test in 2017 showed no evidence of ischemia or infarct.  Echo in April 2015 showed EF 55 to 60%.  Outpatient monitoring in  2017 showed occasional PACs and PVCs and short runs of atrial tachycardia.  In the spring 2023, she was seen with complaints of chest pain.  Coronary CT angiogram showed mild, nonobstructive LAD disease with a calcium score of 80.  Repeat echo at that time showed an EF of 55 to 60% with grade 1 diastolic dysfunction and trivial MR.  In May 2014, she was seen by Nicole Contreras with complaints of chest pain and new inferior T wave inversion on ECG.  Troponin was normal and primary care visit.  She was set up for diagnostic catheterization however, this was denied by her insurance and instead, a  Lexiscan Myoview was performed and showed normal LV function without evidence of ischemia or infarct.   Nicole Contreras was last seen in cardiology clinic on June 20, and reported overall improvement in chest pain symptoms.  She has continued to feel well w/o chest pain. She has some degree of chronic DOE in hot/humid weather, which is unchanged.  She and her husband remain active in their yard, most days of the week.  She generally tolerates that well, but they do have to try and get their work in on the shoulders of the day.  She denies palpitations, pnd, orthopnea, n, v, dizziness, syncope, edema, weight gain, or early satiety.   Home Medications    Current Outpatient Medications  Medication Sig Dispense Refill   aspirin EC 81 MG tablet Take 1 tablet (81 mg total) by mouth daily. Swallow whole. 90 tablet 3   cholecalciferol (VITAMIN D) 1000 units tablet Take 1,000 Units by mouth daily.      Clobetasol Propionate (TEMOVATE) 0.05 % external spray Apply topically 2 (two) times daily. 59 mL 0   fluticasone (FLONASE) 50 MCG/ACT nasal spray Place 2 sprays into both nostrils daily. 16 g 2   levothyroxine (SYNTHROID) 75 MCG tablet Take 1 tablet (75 mcg total) by mouth daily. 90 tablet 0   loratadine (CLARITIN) 10 MG tablet Take 1 tablet (10 mg total) by mouth daily. 90 tablet 1   Multiple Vitamin (MULTIVITAMIN) tablet Take 1 tablet by mouth daily.     zinc gluconate 50 MG tablet Take 50 mg by mouth daily.     No current facility-administered medications for this visit.     Review of Systems    Overall doing well without chest pain.  Chronic, stable dyspnea exertion.  She denies palpitations, PND, orthopnea, dizziness, syncope, edema, or early satiety.  All other systems reviewed and are otherwise negative except as noted above.    Physical Exam    VS:  BP 136/88 (BP Location: Right Arm, Patient Position: Sitting, Cuff Size: Normal)   Pulse 76   Ht 5\' 7"  (1.702 m)   Wt 185 lb 9.6 oz (84.2 kg)    SpO2 95%   BMI 29.07 kg/m  , BMI Body mass index is 29.07 kg/m.     GEN: Well nourished, well developed, in no acute distress. HEENT: normal. Neck: Supple, no JVD, carotid bruits, or masses. Cardiac: RRR, no murmurs, rubs, or gallops. No clubbing, cyanosis, edema.  Radials 2+/PT 2+ and equal bilaterally.  Respiratory:  Respirations regular and unlabored, clear to auscultation bilaterally. GI: Soft, nontender, nondistended, BS + x 4. MS: no deformity or atrophy. Skin: warm and dry, no rash. Neuro:  Strength and sensation are intact. Psych: Normal affect.  Accessory Clinical Findings    Lab Results  Component Value Date   WBC 7.2 03/21/2023   HGB 13.2 03/21/2023  HCT 39.5 03/21/2023   MCV 83.9 03/21/2023   PLT 247 03/21/2023   Lab Results  Component Value Date   CREATININE 0.68 03/21/2023   BUN 15 03/21/2023   NA 142 03/21/2023   K 4.5 03/21/2023   CL 106 03/21/2023   CO2 27 03/21/2023   Lab Results  Component Value Date   ALT 13 03/21/2023   AST 21 03/21/2023   ALKPHOS 82 05/30/2016   BILITOT 0.4 03/21/2023   Lab Results  Component Value Date   CHOL 159 03/21/2023   HDL 81 03/21/2023   LDLCALC 60 03/21/2023   TRIG 101 03/21/2023   CHOLHDL 2.0 03/21/2023    Lab Results  Component Value Date   HGBA1C 5.5 08/08/2017    Assessment & Plan    1.  Precordial chest pain/nonobstructive CAD: Prior history of chest pain with coronary CT angiogram in 2023 showing mild LAD stenosis (25%), with calcium score 8.2 (62nd percentile).  More recently, she had an episode of chest pain in May with new inferior T wave inversion noted on ECG.  She was initially set up for diagnostic catheterization however, due to insurance denial, she subsequently underwent Lexiscan Myoview, which was low risk without ischemia or infarct.  Since then, she has done well and has not had any chest pain since her last visit.  She has chronic, stable dyspnea on exertion and is fairly active in her yard  and around her home.  She remains on aspirin therapy.  LDL was 60 in May 2024-statin nave and she has previously expressed desire to avoid statin therapy.  2.  Elevated blood pressure: Pressures typically run in the 130s at home.  136/88 today.  She will continue to watch blood pressure at home.  3.  History of palpitations: Quiescent.  Previous monitoring in 2017 showed occasional PACs and PVCs with short runs of atrial tachycardia.  4.  Left lower lobe pulmonary nodule: Stable on CT in June 2024.  Followed by pulmonology.  5.  Disposition: Follow-up in 6 months or sooner if necessary.  Nicolasa Ducking, NP 07/05/2023, 11:48 AM

## 2023-07-06 DIAGNOSIS — L4050 Arthropathic psoriasis, unspecified: Secondary | ICD-10-CM | POA: Diagnosis not present

## 2023-07-06 DIAGNOSIS — R899 Unspecified abnormal finding in specimens from other organs, systems and tissues: Secondary | ICD-10-CM | POA: Diagnosis not present

## 2023-07-06 DIAGNOSIS — L409 Psoriasis, unspecified: Secondary | ICD-10-CM | POA: Diagnosis not present

## 2023-07-31 ENCOUNTER — Other Ambulatory Visit: Payer: Self-pay | Admitting: Family Medicine

## 2023-07-31 DIAGNOSIS — E039 Hypothyroidism, unspecified: Secondary | ICD-10-CM

## 2023-09-18 DIAGNOSIS — M1711 Unilateral primary osteoarthritis, right knee: Secondary | ICD-10-CM | POA: Diagnosis not present

## 2023-09-18 DIAGNOSIS — M25561 Pain in right knee: Secondary | ICD-10-CM | POA: Diagnosis not present

## 2023-09-20 DIAGNOSIS — M3501 Sicca syndrome with keratoconjunctivitis: Secondary | ICD-10-CM | POA: Diagnosis not present

## 2023-09-20 DIAGNOSIS — H43813 Vitreous degeneration, bilateral: Secondary | ICD-10-CM | POA: Diagnosis not present

## 2023-09-20 DIAGNOSIS — H353131 Nonexudative age-related macular degeneration, bilateral, early dry stage: Secondary | ICD-10-CM | POA: Diagnosis not present

## 2023-09-20 DIAGNOSIS — Z961 Presence of intraocular lens: Secondary | ICD-10-CM | POA: Diagnosis not present

## 2023-09-20 NOTE — Progress Notes (Unsigned)
Name: Nicole Contreras   MRN: [id number]    DOB: December 06, 1945   Date:09/21/2023       Progress Note  Subjective  Chief Complaint  Follow Up  HPI  Psoriatic arthritis:  She had dysuria with Sulfasalazine, she was on Enbrel but she stopped because she felt it always makes her sick. Currently seeing Dr. Allena Katz.  She took Methotrexate and She stopped taking it in 2021 due to risk of infections.  She is now under the care of Dr. Allena Katz, had multiple X-rays. She does not want to resume medications at this time. She takes otc NSAID's prn She has noticed increase in right knee pain, but had steroid injection earlier this week and is feeling better - using a cane today   Atherosclerosis of Aorta and ne vessel coronary atherosclerosis : discussed statin therapy and aspirin 81 mg . She is not interested in statin therapy    Psoriasis: she sees Dr. Roseanne Kaufman. She is using topical medication and worse is on her elbows. Also uses a solution/clobetasol  on her scalp and she states symptoms are controlled .   Hypothyroidism: taking medication daily without food, no palpitation or dysphagia, denies change in bowel movements or hair loss. Same does for a long time. TSH was normal done 03/2023 . She has mild weight gain but feels well otherwise    History of breast cancer: she had right mastectomy in 1985, had local recurrence in 1986 had recurrence on her skin and had to have chemo and radiation. She never had re-constructive surgery at the time because of the chemo and radiation, she wears a prosthesis. Mammogram left breast was done 03/2021, 10/2022 and I will place another order   Senile purpura; both arms, stable   Recent episode of chest pain: history of palpitation and was  seen by cardiologist June 2023  for heart flutters and occasional SOB, had negative evaluation except for pulmonary nodule, some PAC and SVT. She states that on Sunday she developed substernal tightness , caused some SOB and she felt  tired for the rest of the day. No recent episodes of chest pain, seen by cardiologist and studies below LDL is at goal . She takes aspirin 91 mg daily   IMPRESSION:04/2022 1. Coronary calcium score of 80.2. This was 62nd percentile for age and sex matched control.   2. Normal coronary origin with right dominance.   3. Mild proximal LAD stenosis (25%).   4. CAD-RADS 2. Mild non-obstructive CAD (25-49%). Consider non-atherosclerotic causes of chest pain. Consider preventive therapy and risk factor modification.  Myoview 04/2023     The study is normal. The study is low risk.    No ST deviation was noted.    LV perfusion is normal. There is no evidence of ischemia. There is no  evidence of infarction.    Left ventricular function is normal. Nuclear stress EF: 66 %. End  diastolic cavity size is normal. End systolic cavity size is normal.    CT attenuation images showed mild aortic calcifications and no coronary  calcifications.   Pulmonary nodule: seen by Dr. Marcos Eke and had  repeat CT lung done in June 2024 and is going to have it done one more time to make sure it is stable   Osteopenia wrist: discussed high calcium diet and regular activity   FRAX* RESULTS:  (version: 3.5) 10-year Probability of Fracture1 Major Osteoporotic Fracture2 Hip Fracture 11.0% 0.7% Population: [place] (Caucasian) Risk Factors: History of Fracture (Adult)   Patient  Active Problem List   Diagnosis Date Noted   Atherosclerosis of aorta (HCC) 03/21/2023   Nasal congestion 03/18/2022   Psoriasis of scalp 03/18/2022   Osteopenia after menopause 03/18/2022   Psoriasis 03/18/2022   Senile purpura (HCC) 03/18/2022   Chronic pain of right knee 03/18/2022   Bilateral primary osteoarthritis of knee 10/07/2021   History of mastectomy, right 11/27/2018   Internal hemorrhoids 12/01/2016   Benign neoplasm of transverse colon    Numerous moles 11/30/2015   Gastro-esophageal reflux disease without  esophagitis 09/14/2015   Acquired hypothyroidism 09/14/2015   Menopause 09/14/2015   Hemorrhage, postmenopausal 09/14/2015   Vitamin D deficiency 09/14/2015   History of breast cancer 09/14/2015   Psoriatic arthritis (HCC) 05/22/2014    Past Surgical History:  Procedure Laterality Date   BREAST EXCISIONAL BIOPSY Left 1990   X 2- neg   CARDIAC CATHETERIZATION  [facility]   [facility]; no stent   CATARACT EXTRACTION W/PHACO Right 02/04/2016   Procedure: CATARACT EXTRACTION PHACO AND INTRAOCULAR LENS PLACEMENT (IOC);  Surgeon: Galen Manila, MD;  Location: [facility] ORS;  Service: Ophthalmology;  Laterality: Right;  [place] 00:44 AP% 19.2 CDE 8.49 fluid pack lot # [id number]   CATARACT EXTRACTION W/PHACO Left 06/12/2018   Procedure: CATARACT EXTRACTION PHACO AND INTRAOCULAR LENS PLACEMENT (IOC);  Surgeon: Galen Manila, MD;  Location: [facility] ORS;  Service: Ophthalmology;  Laterality: Left;  [place] 00:56.8 AP% 15.8 CDE 8.95 Fluid Pack lot # [id number]   colonoscopy     COLONOSCOPY WITH PROPOFOL N/A 07/15/2016   Procedure: COLONOSCOPY WITH PROPOFOL;  Surgeon: Midge Minium, MD;  Location: [facility];  Service: Endoscopy;  Laterality: N/A;   ENDOMETRIAL BIOPSY     MASTECTOMY Right 1985   POLYPECTOMY N/A 07/15/2016   Procedure: POLYPECTOMY;  Surgeon: Midge Minium, MD;  Location: [facility];  Service: Endoscopy;  Laterality: N/A;    Family History  Problem Relation Age of Onset   Uterine cancer Mother    Lung cancer Mother    Stroke Mother    Hypertension Mother    Dementia Mother    Heart disease Father    Arrhythmia Father        afib   Cancer Sister 83       thyroid cancer   Cervical cancer Sister    Arrhythmia Sister        afib   Breast cancer Sister 38   Breast cancer Paternal Aunt 29    Social History   Tobacco Use   Smoking status: Former    Current packs/day: 0.00    Average packs/day: 1 pack/day for 10.0 years (10.0 ttl pk-yrs)    Types: Cigarettes    Start date: 11/07/1980     Quit date: 11/07/1990    Years since quitting: 32.8   Smokeless tobacco: Never   Tobacco comments:    smoking cessation materials not required  Substance Use Topics   Alcohol use: No    Alcohol/week: 0.0 standard drinks of alcohol     Current Outpatient Medications:    aspirin EC 81 MG tablet, Take 1 tablet (81 mg total) by mouth daily. Swallow whole., Disp: 90 tablet, Rfl: 3   cholecalciferol (VITAMIN D) 1000 units tablet, Take 1,000 Units by mouth daily. , Disp: , Rfl:    Clobetasol Propionate (TEMOVATE) 0.05 % external spray, Apply topically 2 (two) times daily., Disp: 59 mL, Rfl: 0   fluticasone (FLONASE) 50 MCG/ACT nasal spray, Place 2 sprays into both nostrils daily., Disp: 16 g, Rfl:  2   levothyroxine (SYNTHROID) 75 MCG tablet, Take 1 tablet (75 mcg total) by mouth daily., Disp: 90 tablet, Rfl: 0   loratadine (CLARITIN) 10 MG tablet, Take 1 tablet (10 mg total) by mouth daily., Disp: 90 tablet, Rfl: 1   Multiple Vitamin (MULTIVITAMIN) tablet, Take 1 tablet by mouth daily., Disp: , Rfl:    zinc gluconate 50 MG tablet, Take 50 mg by mouth daily., Disp: , Rfl:   Allergies  Allergen Reactions   Sulfa Antibiotics Rash    I personally reviewed active problem list, medication list, allergies, family history, social history, health maintenance with the patient/caregiver today.   ROS  Ten systems reviewed and is negative except as mentioned in HPI    Objective  Vitals:   09/21/23 0927  BP: 122/84  Pulse: 80  Resp: 16  Temp: 97.8 F (36.6 C)  TempSrc: Oral  SpO2: 99%  Weight: 192 lb 4.8 oz (87.2 kg)  Height: 5\' 7"  (1.702 m)    Body mass index is 30.12 kg/m.  Physical exam   Constitutional: Patient appears well-developed and well-nourished. Obese  No distress.  HEENT: head atraumatic, normocephalic, pupils equal and reactive to light, neck supple Cardiovascular: Normal rate, regular rhythm and normal heart sounds.  No murmur heard. No BLE edema. Pulmonary/Chest:  Effort normal and breath sounds normal. No respiratory distress. Abdominal: Soft.  There is no tenderness. Psychiatric: Patient has a normal mood and affect. behavior is normal. Judgment and thought content normal.    PHQ2/9:    09/21/2023    9:36 AM 03/21/2023    8:32 AM 10/27/2022    2:13 PM 09/20/2022    1:14 PM 03/18/2022    1:30 PM  Depression screen PHQ 2/9  Decreased Interest 0 0 0 0 0  Down, Depressed, Hopeless 0 0 0 0 0  PHQ - 2 Score 0 0 0 0 0  Altered sleeping 0 0  0 0  Tired, decreased energy 0 0  0 0  Change in appetite 0 0  0 0  Feeling bad or failure about yourself  0 0  0 0  Trouble concentrating 0 0  0 0  Moving slowly or fidgety/restless 0 0  0 0  Suicidal thoughts 0 0  0 0  PHQ-9 Score 0 0  0 0    phq 9 is negative   Fall Risk:    09/21/2023    9:36 AM 03/21/2023    8:32 AM 10/27/2022    2:13 PM 09/20/2022    1:14 PM 03/18/2022    1:30 PM  Fall Risk   Falls in the past year? 0 0 0 0 0  Number falls in past yr:  0 0  0  Injury with Fall?  0 0  0  Risk for fall due to : No Fall Risks No Fall Risks No Fall Risks No Fall Risks No Fall Risks  Follow up Falls prevention discussed Falls prevention discussed Falls evaluation completed Education provided;Falls prevention discussed;Falls evaluation completed Falls prevention discussed    Assessment & Plan  1. Atherosclerosis of aorta (HCC)  Not interested in statin therapy   2. Psoriatic arthritis (HCC)  Had a steroid injection recently right knee   3. Senile purpura (HCC)  Reassurance   4. Osteopenia after menopause  Continue vitamin D supplements  5. Breast cancer screening by mammogram  - MM 3D SCREENING MAMMOGRAM UNILATERAL LEFT BREAST; Future  6. Acquired hypothyroidism  - levothyroxine (SYNTHROID) 75 MCG tablet; Take 1  tablet (75 mcg total) by mouth daily.  Dispense: 90 tablet; Refill: 3  7. History of mastectomy, right

## 2023-09-21 ENCOUNTER — Ambulatory Visit: Payer: Medicare PPO | Admitting: Family Medicine

## 2023-09-21 ENCOUNTER — Encounter: Payer: Self-pay | Admitting: Family Medicine

## 2023-09-21 VITALS — BP 122/84 | HR 80 | Temp 97.8°F | Resp 16 | Ht 67.0 in | Wt 192.3 lb

## 2023-09-21 DIAGNOSIS — D692 Other nonthrombocytopenic purpura: Secondary | ICD-10-CM | POA: Diagnosis not present

## 2023-09-21 DIAGNOSIS — D225 Melanocytic nevi of trunk: Secondary | ICD-10-CM | POA: Diagnosis not present

## 2023-09-21 DIAGNOSIS — M8580 Other specified disorders of bone density and structure, unspecified site: Secondary | ICD-10-CM | POA: Diagnosis not present

## 2023-09-21 DIAGNOSIS — E039 Hypothyroidism, unspecified: Secondary | ICD-10-CM | POA: Diagnosis not present

## 2023-09-21 DIAGNOSIS — Z1231 Encounter for screening mammogram for malignant neoplasm of breast: Secondary | ICD-10-CM | POA: Diagnosis not present

## 2023-09-21 DIAGNOSIS — D485 Neoplasm of uncertain behavior of skin: Secondary | ICD-10-CM | POA: Diagnosis not present

## 2023-09-21 DIAGNOSIS — D2262 Melanocytic nevi of left upper limb, including shoulder: Secondary | ICD-10-CM | POA: Diagnosis not present

## 2023-09-21 DIAGNOSIS — D2261 Melanocytic nevi of right upper limb, including shoulder: Secondary | ICD-10-CM | POA: Diagnosis not present

## 2023-09-21 DIAGNOSIS — L4050 Arthropathic psoriasis, unspecified: Secondary | ICD-10-CM

## 2023-09-21 DIAGNOSIS — L821 Other seborrheic keratosis: Secondary | ICD-10-CM | POA: Diagnosis not present

## 2023-09-21 DIAGNOSIS — D2272 Melanocytic nevi of left lower limb, including hip: Secondary | ICD-10-CM | POA: Diagnosis not present

## 2023-09-21 DIAGNOSIS — D2372 Other benign neoplasm of skin of left lower limb, including hip: Secondary | ICD-10-CM | POA: Diagnosis not present

## 2023-09-21 DIAGNOSIS — Z9011 Acquired absence of right breast and nipple: Secondary | ICD-10-CM

## 2023-09-21 DIAGNOSIS — D2271 Melanocytic nevi of right lower limb, including hip: Secondary | ICD-10-CM | POA: Diagnosis not present

## 2023-09-21 DIAGNOSIS — Z780 Asymptomatic menopausal state: Secondary | ICD-10-CM | POA: Diagnosis not present

## 2023-09-21 DIAGNOSIS — I700 Atherosclerosis of aorta: Secondary | ICD-10-CM

## 2023-09-21 DIAGNOSIS — L400 Psoriasis vulgaris: Secondary | ICD-10-CM | POA: Diagnosis not present

## 2023-09-21 MED ORDER — LEVOTHYROXINE SODIUM 75 MCG PO TABS: 75.0000 ug | ORAL_TABLET | Freq: Every day | ORAL | 3 refills | Status: DC

## 2023-10-18 ENCOUNTER — Ambulatory Visit
Admission: RE | Admit: 2023-10-18 | Discharge: 2023-10-18 | Disposition: A | Discharge status: Home / Self Care | Payer: Medicare PPO | Source: Ambulatory Visit | Attending: Family Medicine | Admitting: Family Medicine

## 2023-10-18 DIAGNOSIS — R911 Solitary pulmonary nodule: Secondary | ICD-10-CM | POA: Diagnosis not present

## 2023-10-18 DIAGNOSIS — I2510 Atherosclerotic heart disease of native coronary artery without angina pectoris: Secondary | ICD-10-CM | POA: Diagnosis not present

## 2023-10-24 ENCOUNTER — Ambulatory Visit
Admission: RE | Admit: 2023-10-24 | Discharge: 2023-10-24 | Disposition: A | Discharge status: Home / Self Care | Payer: Medicare PPO | Source: Ambulatory Visit | Attending: Family Medicine | Admitting: Family Medicine

## 2023-10-24 DIAGNOSIS — Z1231 Encounter for screening mammogram for malignant neoplasm of breast: Secondary | ICD-10-CM | POA: Insufficient documentation

## 2023-11-03 ENCOUNTER — Other Ambulatory Visit: Payer: Self-pay

## 2023-11-03 DIAGNOSIS — R911 Solitary pulmonary nodule: Secondary | ICD-10-CM

## 2023-11-09 ENCOUNTER — Ambulatory Visit: Payer: Medicare PPO

## 2023-11-09 VITALS — BP 140/80 | Ht 67.0 in | Wt 196.4 lb

## 2023-11-09 DIAGNOSIS — Z0000 Encounter for general adult medical examination without abnormal findings: Secondary | ICD-10-CM

## 2023-11-09 NOTE — Patient Instructions (Addendum)
 Nicole Contreras , Thank you for taking time to come for your Medicare Wellness Visit. I appreciate your ongoing commitment to your health goals. Please review the following plan we discussed and let me know if I can assist you in the future.   Referrals/Orders/Follow-Ups/Clinician Recommendations: NONE  This is a list of the screening recommended for you and due dates:  Health Maintenance  Topic Date Due   Zoster (Shingles) Vaccine (1 of 2) 12/21/2023*   Flu Shot  02/05/2024*   Mammogram  10/23/2024   Medicare Annual Wellness Visit  11/08/2024   Colon Cancer Screening  07/15/2026   DTaP/Tdap/Td vaccine (3 - Td or Tdap) 09/16/2030   Pneumonia Vaccine  Completed   DEXA scan (bone density measurement)  Completed   Hepatitis C Screening  Completed   HPV Vaccine  Aged Out   COVID-19 Vaccine  Discontinued  *Topic was postponed. The date shown is not the original due date.    Advanced directives: (ACP Link)Information on Advanced Care Planning can be found at [place]  Secretary of [facility] Advance Health Care Directives Advance Health Care Directives ([email or web address]   Next Medicare Annual Wellness Visit scheduled for next year: Yes   11/21/24 @ 9:30 AM IN PERSON

## 2023-11-09 NOTE — Progress Notes (Signed)
 Subjective:   Nicole Contreras is a 78 y.o. female who presents for Medicare Annual (Subsequent) preventive examination.  Visit Complete: In person  Cardiac Risk Factors include: advanced age (>68men, >60 women);obesity (BMI >30kg/m2)     Objective:    Today's Vitals   11/09/23 0904 11/09/23 0921  BP: (!) 140/80   Weight: 196 lb 6.4 oz (89.1 kg)   Height: 5' 7 (1.702 m)   PainSc:  3    Body mass index is 30.76 kg/m.     11/09/2023    9:28 AM 10/27/2022    2:26 PM 10/26/2021    3:07 PM 10/22/2020    2:21 PM 03/14/2019   10:00 AM 06/12/2018    9:58 AM 03/09/2018    9:29 AM  Advanced Directives  Does Patient Have a Medical Advance Directive? No No No No No No No  Would patient like information on creating a medical advance directive? No - Patient declined No - Patient declined Yes (MAU/Ambulatory/Procedural Areas - Information given) No - Patient declined Yes (MAU/Ambulatory/Procedural Areas - Information given) No - Patient declined Yes (MAU/Ambulatory/Procedural Areas - Information given)    Current Medications (verified) Outpatient Encounter Medications as of 11/09/2023  Medication Sig   cholecalciferol (VITAMIN D ) 1000 units tablet Take 1,000 Units by mouth daily.    Clobetasol  Propionate (TEMOVATE ) 0.05 % external spray Apply topically 2 (two) times daily.   fluticasone  (FLONASE ) 50 MCG/ACT nasal spray Place 2 sprays into both nostrils daily.   levothyroxine  (SYNTHROID ) 75 MCG tablet Take 1 tablet (75 mcg total) by mouth daily.   loratadine  (CLARITIN ) 10 MG tablet Take 1 tablet (10 mg total) by mouth daily.   Multiple Vitamin (MULTIVITAMIN) tablet Take 1 tablet by mouth daily.   zinc gluconate 50 MG tablet Take 50 mg by mouth daily.   aspirin  EC 81 MG tablet Take 1 tablet (81 mg total) by mouth daily. Swallow whole. (Patient not taking: Reported on 11/09/2023)   No facility-administered encounter medications on file as of 11/09/2023.    Allergies (verified) Sulfa  antibiotics   History: Past Medical History:  Diagnosis Date   Arthritis    Atypical chest pain    a. 03/2014 ETT: Ex time 4:10, Max HR 160 bpm, no acute st/t changes.   Breast cancer (HCC) 1986   s/p radiation.   Bright's disease    Hx   Diastolic dysfunction    a. 02/2014 Echo: EF 55-60%, no rwma, Gr1 DD; b. 04/2022 Echo: EF 55-60%, no rwma, GrI DD, nl RV size/fxn, triv MR.   Dizziness and giddiness    Fecal smearing    GERD (gastroesophageal reflux disease)    Heart murmur    as child   Insomnia, unspecified    Non-obstructive CAD (coronary artery disease)    a. 04/2022 Cor CTA: Ca2+ = 80.2 (62nd%'ile). LAD 25-49%. Otherwise nl cors; b. 04/2023 MV: EF 66%, no ischemia/infarct.   Obesity, unspecified    Personal history of chemotherapy    Personal history of radiation therapy 1986   right breast ca   PONV (postoperative nausea and vomiting)    after cataract procedure   Postmenopausal bleeding    Psoriasis    Psoriatic arthropathy (HCC)    Reflux esophagitis    Right cataract    a. 01/2016 s/p cataract surgery.   Spasm of muscle    Unspecified hypothyroidism    Unspecified urinary incontinence    Unspecified vitamin D  deficiency    Past Surgical History:  Procedure Laterality Date   BREAST EXCISIONAL BIOPSY Left 1990   X 2- neg   CARDIAC CATHETERIZATION  90's   [facility]; no stent   CATARACT EXTRACTION W/PHACO Right 02/04/2016   Procedure: CATARACT EXTRACTION PHACO AND INTRAOCULAR LENS PLACEMENT (IOC);  Surgeon: Elsie Carmine, MD;  Location: [facility];  Service: Ophthalmology;  Laterality: Right;  US  00:44 AP% 19.2 CDE 8.49 fluid pack lot # [id number]   CATARACT EXTRACTION W/PHACO Left 06/12/2018   Procedure: CATARACT EXTRACTION PHACO AND INTRAOCULAR LENS PLACEMENT (IOC);  Surgeon: Carmine Elsie, MD;  Location: [facility];  Service: Ophthalmology;  Laterality: Left;  US  00:56.8 AP% 15.8 CDE 8.95 Fluid Pack lot # [id number]   colonoscopy     COLONOSCOPY WITH PROPOFOL  N/A  07/15/2016   Procedure: COLONOSCOPY WITH PROPOFOL ;  Surgeon: Rogelia Copping, MD;  Location: [facility];  Service: Endoscopy;  Laterality: N/A;   ENDOMETRIAL BIOPSY     MASTECTOMY Right 1985   POLYPECTOMY N/A 07/15/2016   Procedure: POLYPECTOMY;  Surgeon: Rogelia Copping, MD;  Location: [facility];  Service: Endoscopy;  Laterality: N/A;   Family History  Problem Relation Age of Onset   Uterine cancer Mother    Lung cancer Mother    Stroke Mother    Hypertension Mother    Dementia Mother    Heart disease Father    Arrhythmia Father        afib   Cancer Sister 28       thyroid  cancer   Cervical cancer Sister    Arrhythmia Sister        afib   Breast cancer Sister 66   Breast cancer Paternal Aunt 17   Social History   Socioeconomic History   Marital status: Married    Spouse name: [name]   Number of children: 2   Years of education: Not on file   Highest education level: Associate degree: occupational, scientist, product/process development, or vocational program  Occupational History   Occupation: Retired  Tobacco Use   Smoking status: Former    Current packs/day: 0.00    Average packs/day: 1 pack/day for 10.0 years (10.0 ttl pk-yrs)    Types: Cigarettes    Start date: 11/07/1980    Quit date: 11/07/1990    Years since quitting: 33.0   Smokeless tobacco: Never   Tobacco comments:    smoking cessation materials not required  Vaping Use   Vaping status: Never Used  Substance and Sexual Activity   Alcohol use: No    Alcohol/week: 0.0 standard drinks of alcohol   Drug use: No   Sexual activity: Not Currently  Other Topics Concern   Not on file  Social History Narrative   Not on file   Social Drivers of Health   Financial Resource Strain: Low Risk  (11/09/2023)   Overall Financial Resource Strain (CARDIA)    Difficulty of Paying Living Expenses: Not hard at all  Food Insecurity: No Food Insecurity (11/09/2023)   Hunger Vital Sign    Worried About Running Out of Food in the Last Year: Never  true    Ran Out of Food in the Last Year: Never true  Transportation Needs: No Transportation Needs (11/09/2023)   PRAPARE - Administrator, Civil Service (Medical): No    Lack of Transportation (Non-Medical): No  Physical Activity: Insufficiently Active (11/09/2023)   Exercise Vital Sign    Days of Exercise per Week: 3 days    Minutes of Exercise per Session: 30 min  Stress: No  Stress Concern Present (11/09/2023)   [employer] of Occupational Health - Occupational Stress Questionnaire    Feeling of Stress : Not at all  Social Connections: Moderately Integrated (11/09/2023)   Social Connection and Isolation Panel [NHANES]    Frequency of Communication with Friends and Family: More than three times a week    Frequency of Social Gatherings with Friends and Family: Once a week    Attends Religious Services: More than 4 times per year    Active Member of [employer] or Organizations: No    Attends Engineer, Structural: Never    Marital Status: Married    Tobacco Counseling Counseling given: Not Answered Tobacco comments: smoking cessation materials not required   Clinical Intake:  Pre-visit preparation completed: Yes  Pain : 0-10 Pain Score: 3  Pain Type: Acute pain Pain Location: Other (Comment) Pain Radiating Towards: urinary pain this morning Pain Descriptors / Indicators: Burning Pain Onset: Yesterday Pain Frequency: Constant     BMI - recorded: 30.76 Nutritional Status: BMI > 30  Obese Nutritional Risks: None Diabetes: No  How often do you need to have someone help you when you read instructions, pamphlets, or other written materials from your doctor or pharmacy?: 1 - Never  Interpreter Needed?: No  Information entered by :: JHONNIE DAS, LPN   Activities of Daily Living    11/09/2023    9:29 AM 09/21/2023    9:36 AM  In your present state of health, do you have any difficulty performing the following activities:  Hearing? 0 0  Vision? 0 0   Difficulty concentrating or making decisions? 0 0  Walking or climbing stairs? 1 0  Comment IF KNEE HURTS   Dressing or bathing? 0 0  Doing errands, shopping? 0 0  Preparing Food and eating ? N   Using the Toilet? N   In the past six months, have you accidently leaked urine? N   Do you have problems with loss of bowel control? N   Managing your Medications? N   Managing your Finances? N   Housekeeping or managing your Housekeeping? N     Patient Care Team: Sowles, Krichna, MD as PCP - General (Family Medicine) Darron Deatrice LABOR, MD as PCP - Cardiology (Cardiology) Maryl Zachary LELON Mickey., MD as Consulting Physician (Rheumatology) Isenstein, Arin L, MD as Consulting Physician (Dermatology) Hughie Sharper, MD as Anesthesiologist (Orthodontics) Jaye Fallow, MD as Referring Physician (Ophthalmology)  Indicate any recent Medical Services you may have received from other than [facility] providers in the past year (date may be approximate).     Assessment:   This is a routine wellness examination for Adalynd.  Hearing/Vision screen Hearing Screening - Comments:: NO AIDS Vision Screening - Comments:: [facility]- DR.PORFILIO   Goals Addressed             This Visit's Progress    DIET - EAT MORE FRUITS AND VEGETABLES         Depression Screen    11/09/2023    9:26 AM 09/21/2023    9:36 AM 03/21/2023    8:32 AM 10/27/2022    2:13 PM 09/20/2022    1:14 PM 03/18/2022    1:30 PM 10/26/2021    3:07 PM  PHQ 2/9 Scores  PHQ - 2 Score 0 0 0 0 0 0 0  PHQ- 9 Score 0 0 0  0 0 0    Fall Risk    11/09/2023    9:29 AM 09/21/2023    9:36  AM 03/21/2023    8:32 AM 10/27/2022    2:13 PM 09/20/2022    1:14 PM  Fall Risk   Falls in the past year? 0 0 0 0 0  Number falls in past yr: 0  0 0   Injury with Fall? 0  0 0   Risk for fall due to : No Fall Risks No Fall Risks No Fall Risks No Fall Risks No Fall Risks  Follow up Falls prevention discussed;Falls evaluation completed Falls  prevention discussed Falls prevention discussed Falls evaluation completed Education provided;Falls prevention discussed;Falls evaluation completed    MEDICARE RISK AT HOME: Medicare Risk at Home Any stairs in or around the home?: Yes If so, are there any without handrails?: No Home free of loose throw rugs in walkways, pet beds, electrical cords, etc?: Yes Adequate lighting in your home to reduce risk of falls?: Yes Life alert?: No Use of a cane, walker or w/c?: Yes (CANE IF KNEE HURTS) Grab bars in the bathroom?: Yes Shower chair or bench in shower?: Yes Elevated toilet seat or a handicapped toilet?: Yes  TIMED UP AND GO:  Was the test performed?  Yes  Length of time to ambulate 10 feet: 4 sec Gait steady and fast without use of assistive device    Cognitive Function:        11/09/2023    9:30 AM 10/27/2022    2:14 PM 03/14/2019   10:04 AM 03/09/2018    9:16 AM  6CIT Screen  What Year? 0 points 0 points 0 points 0 points  What month? 0 points 0 points 0 points 0 points  What time? 0 points 0 points 0 points 3 points  Count back from 20 0 points 0 points 0 points 0 points  Months in reverse 0 points 0 points 0 points 0 points  Repeat phrase 0 points 0 points 0 points 0 points  Total Score 0 points 0 points 0 points 3 points    Immunizations Immunization History  Administered Date(s) Administered   Fluad Quad(high Dose 65+) 08/16/2019   Influenza, High Dose Seasonal PF 08/08/2017   Influenza, Seasonal, Injecte, Preservative Fre 08/16/2012, 08/07/2013   Influenza,inj,Quad PF,6+ Mos 08/04/2014   Influenza,inj,quad, With Preservative 08/31/2018   Influenza-Unspecified 08/24/2015, 08/18/2016, 08/23/2018   Pneumococcal Conjugate-13 09/14/2015   Pneumococcal Polysaccharide-23 08/19/2013, 08/16/2019   Tdap 10/05/2010, 09/16/2020   Zoster, Live 04/08/2011    TDAP status: Up to date  Flu Vaccine status: Declined, Education has been provided regarding the importance of this  vaccine but patient still declined. Advised may receive this vaccine at local pharmacy or Health Dept. Aware to provide a copy of the vaccination record if obtained from local pharmacy or Health Dept. Verbalized acceptance and understanding.  Pneumococcal vaccine status: Up to date  Covid-19 vaccine status: Declined, Education has been provided regarding the importance of this vaccine but patient still declined. Advised may receive this vaccine at local pharmacy or Health Dept.or vaccine clinic. Aware to provide a copy of the vaccination record if obtained from local pharmacy or Health Dept. Verbalized acceptance and understanding.  Qualifies for Shingles Vaccine? Yes   Zostavax completed Yes   Shingrix  Completed?: No.    Education has been provided regarding the importance of this vaccine. Patient has been advised to call insurance company to determine out of pocket expense if they have not yet received this vaccine. Advised may also receive vaccine at local pharmacy or Health Dept. Verbalized acceptance and understanding.  Screening Tests Health  Maintenance  Topic Date Due   Zoster Vaccines- Shingrix  (1 of 2) 12/21/2023 (Originally 08/28/1965)   INFLUENZA VACCINE  02/05/2024 (Originally 06/08/2023)   MAMMOGRAM  10/23/2024   Medicare Annual Wellness (AWV)  11/08/2024   Colonoscopy  07/15/2026   DTaP/Tdap/Td (3 - Td or Tdap) 09/16/2030   Pneumonia Vaccine 32+ Years old  Completed   DEXA SCAN  Completed   Hepatitis C Screening  Completed   HPV VACCINES  Aged Out   COVID-19 Vaccine  Discontinued    Health Maintenance  There are no preventive care reminders to display for this patient.   Colorectal cancer screening: No longer required.   Mammogram status: Completed 10/24/23. Repeat every year  Bone Density status: Completed 10/20/22. Results reflect: Bone density results: OSTEOPENIA. Repeat every 5 years.  Lung Cancer Screening: (Low Dose CT Chest recommended if Age 3-80 years, 20  pack-year currently smoking OR have quit w/in 15years.) does not qualify.   Additional Screening:  Hepatitis C Screening: does qualify; Completed 03/20/13  Vision Screening: Recommended annual ophthalmology exams for early detection of glaucoma and other disorders of the eye. Is the patient up to date with their annual eye exam?  Yes  Who is the provider or what is the name of the office in which the patient attends annual eye exams? DR.PORFILIO If pt is not established with a provider, would they like to be referred to a provider to establish care? No .   Dental Screening: Recommended annual dental exams for proper oral hygiene   Community Resource Referral / Chronic Care Management: CRR required this visit?  No   CCM required this visit?  No     Plan:     I have personally reviewed and noted the following in the patient's chart:   Medical and social history Use of alcohol, tobacco or illicit drugs  Current medications and supplements including opioid prescriptions. Patient is not currently taking opioid prescriptions. Functional ability and status Nutritional status Physical activity Advanced directives List of other physicians Hospitalizations, surgeries, and ER visits in previous 12 months Vitals Screenings to include cognitive, depression, and falls Referrals and appointments  In addition, I have reviewed and discussed with patient certain preventive protocols, quality metrics, and best practice recommendations. A written personalized care plan for preventive services as well as general preventive health recommendations were provided to patient.     Jhonnie GORMAN Das, LPN   06/13/7973   After Visit Summary: (In Person-Declined) Patient declined AVS at this time.- ON [facility]  Nurse Notes: NONE

## 2023-11-10 ENCOUNTER — Encounter: Payer: Self-pay | Admitting: Family Medicine

## 2023-11-10 ENCOUNTER — Ambulatory Visit: Payer: Medicare PPO | Admitting: Family Medicine

## 2023-11-10 VITALS — BP 136/80 | HR 91 | Temp 97.7°F | Resp 16 | Ht 67.0 in | Wt 195.4 lb

## 2023-11-10 DIAGNOSIS — R310 Gross hematuria: Secondary | ICD-10-CM

## 2023-11-10 DIAGNOSIS — R300 Dysuria: Secondary | ICD-10-CM | POA: Diagnosis not present

## 2023-11-10 LAB — POCT URINALYSIS DIPSTICK
Glucose, UA: NEGATIVE
Ketones, UA: NEGATIVE
Nitrite, UA: NEGATIVE
Protein, UA: POSITIVE — AB
Spec Grav, UA: >=1.03 — AB (ref 1.010–1.025)
Urobilinogen, UA: 0.2 U/dL
pH, UA: 6 (ref 5.0–8.0)

## 2023-11-10 MED ORDER — NITROFURANTOIN MONOHYD MACRO 100 MG PO CAPS
100.0000 mg | ORAL_CAPSULE | Freq: Two times a day (BID) | ORAL | 0 refills | Status: DC
Start: 2023-11-10 — End: 2023-12-26

## 2023-11-10 NOTE — Progress Notes (Signed)
 Name: Nicole Contreras   MRN: [id number]    DOB: Jul 17, 1946   Date:11/10/2023       Progress Note  Subjective  Chief Complaint  Chief Complaint  Patient presents with   Dysuria    Onset for 2 days   Urinary Frequency    HPI  Discussed the use of AI scribe software for clinical note transcription with the patient, who gave verbal consent to proceed.  History of Present Illness   The patient, with a history of recurrent urinary tract infections as a child and young adult, presented with a two-day history of dysuria. The discomfort was described as a burning sensation during urination, which was worse in the mornings and seemed to improve with hydration throughout the day. The patient denied any fever or chills but reported feeling slightly queasy. She also noted some lower back pain on the right side, but it was unclear if this was related to her known psoriatic arthritis.  The patient also reported an episode of visible blood in the toilet bowl after urination, which occurred approximately a week ago. There was no associated pain, and no further episodes of hematuria were reported. The patient denied any pelvic pain or changes in bowel habits.  The patient's sexual activity was infrequent, with the last intercourse reported about a week and a half ago. The patient was advised to monitor for any recurrence of the hematuria and to report if the urine test results were negative for infection.          Patient Active Problem List   Diagnosis Date Noted   Atherosclerosis of aorta (HCC) 03/21/2023   Nasal congestion 03/18/2022   Psoriasis of scalp 03/18/2022   Osteopenia after menopause 03/18/2022   Psoriasis 03/18/2022   Senile purpura (HCC) 03/18/2022   Chronic pain of right knee 03/18/2022   Bilateral primary osteoarthritis of knee 10/07/2021   History of mastectomy, right 11/27/2018   Internal hemorrhoids 12/01/2016   Benign neoplasm of transverse colon    Numerous moles  11/30/2015   Gastro-esophageal reflux disease without esophagitis 09/14/2015   Acquired hypothyroidism 09/14/2015   Menopause 09/14/2015   Hemorrhage, postmenopausal 09/14/2015   Vitamin D  deficiency 09/14/2015   History of breast cancer 09/14/2015   Psoriatic arthritis (HCC) 05/22/2014    Social History   Tobacco Use   Smoking status: Former    Current packs/day: 0.00    Average packs/day: 1 pack/day for 10.0 years (10.0 ttl pk-yrs)    Types: Cigarettes    Start date: 11/07/1980    Quit date: 11/07/1990    Years since quitting: 33.0   Smokeless tobacco: Never   Tobacco comments:    smoking cessation materials not required  Substance Use Topics   Alcohol use: No    Alcohol/week: 0.0 standard drinks of alcohol     Current Outpatient Medications:    cholecalciferol (VITAMIN D ) 1000 units tablet, Take 1,000 Units by mouth daily. , Disp: , Rfl:    Clobetasol  Propionate (TEMOVATE ) 0.05 % external spray, Apply topically 2 (two) times daily., Disp: 59 mL, Rfl: 0   fluticasone  (FLONASE ) 50 MCG/ACT nasal spray, Place 2 sprays into both nostrils daily., Disp: 16 g, Rfl: 2   levothyroxine  (SYNTHROID ) 75 MCG tablet, Take 1 tablet (75 mcg total) by mouth daily., Disp: 90 tablet, Rfl: 3   loratadine  (CLARITIN ) 10 MG tablet, Take 1 tablet (10 mg total) by mouth daily., Disp: 90 tablet, Rfl: 1   Multiple Vitamin (MULTIVITAMIN) tablet, Take 1 tablet  by mouth daily., Disp: , Rfl:    nitrofurantoin , macrocrystal-monohydrate, (MACROBID ) 100 MG capsule, Take 1 capsule (100 mg total) by mouth 2 (two) times daily., Disp: 10 capsule, Rfl: 0   zinc gluconate 50 MG tablet, Take 50 mg by mouth daily., Disp: , Rfl:    aspirin  EC 81 MG tablet, Take 1 tablet (81 mg total) by mouth daily. Swallow whole. (Patient not taking: Reported on 11/10/2023), Disp: 90 tablet, Rfl: 3  Allergies  Allergen Reactions   Sulfa Antibiotics Rash    ROS  Ten systems reviewed and is negative except as mentioned in HPI     Objective  Vitals:   11/10/23 0920 11/10/23 0939  BP: (!) 152/84 136/80  Pulse: 91   Resp: 16   Temp: 97.7 F (36.5 C)   TempSrc: Oral   SpO2: 98%   Weight: 195 lb 6.4 oz (88.6 kg)   Height: 5' 7 (1.702 m)     Body mass index is 30.6 kg/m.  Physical Exam  Constitutional: Patient appears well-developed and well-nourished. Obese  No distress.  HEENT: head atraumatic, normocephalic, pupils equal and reactive to light, neck supple, throat within normal limits Cardiovascular: Normal rate, regular rhythm and normal heart sounds.  No murmur heard. No BLE edema. Pulmonary/Chest: Effort normal and breath sounds normal. No respiratory distress. Abdominal: Soft.  There is no tenderness. Negative CVA tenderness Psychiatric: Patient has a normal mood and affect. behavior is normal. Judgment and thought content normal.   Recent Results (from the past 2160 hours)  POCT urinalysis dipstick     Status: Abnormal   Collection Time: 11/10/23  9:23 AM  Result Value Ref Range   Color, UA Yellow    Clarity, UA Cloudy\    Glucose, UA Negative Negative   Bilirubin, UA small    Ketones, UA Negative    Spec Grav, UA >=1.030 (A) 1.010 - 1.025   Blood, UA Large    pH, UA 6.0 5.0 - 8.0   Protein, UA Positive (A) Negative   Urobilinogen, UA 0.2 0.2 or 1.0 E.U./dL   Nitrite, UA Negative    Leukocytes, UA Large (3+) (A) Negative   Appearance yellow    Odor foul      Assessment & Plan     Urinary Symptoms Recent onset of dysuria. Urinalysis shows pus cells and protein, but negative nitrates. History of kidney infection in childhood. No fever, chills, or severe flank pain. -Start empiric treatment for suspected urinary tract infection. -Send urine for culture. -Check for drug interactions and if none, prescribe Macrobid  twice daily for 5 days. -Advise patient to monitor for worsening symptoms, such as high back pain, fever, chills, or vomiting, which could indicate progression to a kidney  infection.  Unexplained Hematuria Reported episode of blood in the toilet bowl after urination, but no blood on underwear or subsequent episodes. No recent sexual activity or change in bowel movements. -If urine culture is negative, plan for pelvic ultrasound to investigate source of bleeding. -Advise patient to monitor for recurrence of bleeding and to report any further episodes.

## 2023-11-12 LAB — URINE CULTURE
MICRO NUMBER:: 15916859
SPECIMEN QUALITY:: ADEQUATE

## 2023-11-23 DIAGNOSIS — H902 Conductive hearing loss, unspecified: Secondary | ICD-10-CM | POA: Diagnosis not present

## 2023-11-23 DIAGNOSIS — H6123 Impacted cerumen, bilateral: Secondary | ICD-10-CM | POA: Diagnosis not present

## 2023-11-29 DIAGNOSIS — H2000 Unspecified acute and subacute iridocyclitis: Secondary | ICD-10-CM | POA: Diagnosis not present

## 2023-12-26 ENCOUNTER — Encounter: Payer: Self-pay | Admitting: Family Medicine

## 2023-12-26 ENCOUNTER — Ambulatory Visit: Payer: Medicare PPO | Admitting: Family Medicine

## 2023-12-26 VITALS — BP 136/84 | HR 96 | Temp 98.0°F | Resp 16 | Ht 67.0 in | Wt 192.0 lb

## 2023-12-26 DIAGNOSIS — R062 Wheezing: Secondary | ICD-10-CM | POA: Diagnosis not present

## 2023-12-26 DIAGNOSIS — J069 Acute upper respiratory infection, unspecified: Secondary | ICD-10-CM

## 2023-12-26 MED ORDER — PREDNISONE 20 MG PO TABS
40.0000 mg | ORAL_TABLET | Freq: Every day | ORAL | 0 refills | Status: AC
Start: 2023-12-26 — End: 2023-12-31

## 2023-12-26 MED ORDER — ALBUTEROL SULFATE HFA 108 (90 BASE) MCG/ACT IN AERS
2.0000 | INHALATION_SPRAY | Freq: Four times a day (QID) | RESPIRATORY_TRACT | 0 refills | Status: DC | PRN
Start: 2023-12-26 — End: 2024-03-20

## 2023-12-26 NOTE — Progress Notes (Signed)
Patient ID: Nicole Contreras, female    DOB: 1946-03-20, 78 y.o.   MRN: [id number]  PCP: Alba Cory, MD  Chief Complaint  Patient presents with   Cough    X2 weeks, w/wheezing    Subjective:   Nicole Contreras is a 78 y.o. female, presents to clinic with CC of the following:  HPI  Some URI that started a few weeks ago she is having cough that she cannot get rid of tried robitussin, mucinex She continue to cough, feels that nasal sx have improved some but cough persists and she has some tightness, wheeze and SOB with activity She will get tired and SOB with normal activities in her home, or just washing her hair No fever + sweats (but not sure if these are her normal postmenopausal sweats)  Needed inhalers in the past with URI's none in the past couple years   Patient Active Problem List   Diagnosis Date Noted   Atherosclerosis of aorta (HCC) 03/21/2023   Nasal congestion 03/18/2022   Psoriasis of scalp 03/18/2022   Osteopenia after menopause 03/18/2022   Psoriasis 03/18/2022   Senile purpura (HCC) 03/18/2022   Chronic pain of right knee 03/18/2022   Bilateral primary osteoarthritis of knee 10/07/2021   History of mastectomy, right 11/27/2018   Internal hemorrhoids 12/01/2016   Benign neoplasm of transverse colon    Numerous moles 11/30/2015   Gastro-esophageal reflux disease without esophagitis 09/14/2015   Acquired hypothyroidism 09/14/2015   Menopause 09/14/2015   Hemorrhage, postmenopausal 09/14/2015   Vitamin D deficiency 09/14/2015   History of breast cancer 09/14/2015   Psoriatic arthritis (HCC) 05/22/2014      Current Outpatient Medications:    cholecalciferol (VITAMIN D) 1000 units tablet, Take 1,000 Units by mouth daily. , Disp: , Rfl:    Clobetasol Propionate (TEMOVATE) 0.05 % external spray, Apply topically 2 (two) times daily., Disp: 59 mL, Rfl: 0   fluticasone (FLONASE) 50 MCG/ACT nasal spray, Place 2 sprays into both nostrils daily., Disp:  16 g, Rfl: 2   levothyroxine (SYNTHROID) 75 MCG tablet, Take 1 tablet (75 mcg total) by mouth daily., Disp: 90 tablet, Rfl: 3   loratadine (CLARITIN) 10 MG tablet, Take 1 tablet (10 mg total) by mouth daily., Disp: 90 tablet, Rfl: 1   Multiple Vitamin (MULTIVITAMIN) tablet, Take 1 tablet by mouth daily., Disp: , Rfl:    zinc gluconate 50 MG tablet, Take 50 mg by mouth daily., Disp: , Rfl:    aspirin EC 81 MG tablet, Take 1 tablet (81 mg total) by mouth daily. Swallow whole. (Patient not taking: Reported on 11/09/2023), Disp: 90 tablet, Rfl: 3   nitrofurantoin, macrocrystal-monohydrate, (MACROBID) 100 MG capsule, Take 1 capsule (100 mg total) by mouth 2 (two) times daily. (Patient not taking: Reported on 12/26/2023), Disp: 10 capsule, Rfl: 0   Allergies  Allergen Reactions   Sulfa Antibiotics Rash     Social History   Tobacco Use   Smoking status: Former    Current packs/day: 0.00    Average packs/day: 1 pack/day for 10.0 years (10.0 ttl pk-yrs)    Types: Cigarettes    Start date: 11/07/1980    Quit date: 11/07/1990    Years since quitting: 33.1   Smokeless tobacco: Never   Tobacco comments:    smoking cessation materials not required  Vaping Use   Vaping status: Never Used  Substance Use Topics   Alcohol use: No    Alcohol/week: 0.0 standard drinks of alcohol  Drug use: No      Chart Review Today: I personally reviewed active problem list, medication list, allergies, family history, social history, health maintenance, notes from last encounter, lab results, imaging with the patient/caregiver today.   Review of Systems  Constitutional: Negative.   HENT: Negative.    Eyes: Negative.   Respiratory: Negative.    Cardiovascular: Negative.   Gastrointestinal: Negative.   Endocrine: Negative.   Genitourinary: Negative.   Musculoskeletal: Negative.   Skin: Negative.   Allergic/Immunologic: Negative.   Neurological: Negative.   Hematological: Negative.    Psychiatric/Behavioral: Negative.    All other systems reviewed and are negative.      Objective:   Vitals:   12/26/23 1259  BP: 136/84  Pulse: 96  Resp: 16  Temp: 98 F (36.7 C)  SpO2: 99%  Weight: 192 lb (87.1 kg)  Height: 5\' 7"  (1.702 m)    Body mass index is 30.07 kg/m.  Physical Exam   Results for orders placed or performed in visit on 11/10/23  POCT urinalysis dipstick   Collection Time: 11/10/23  9:23 AM  Result Value Ref Range   Color, UA Yellow    Clarity, UA Cloudy\    Glucose, UA Negative Negative   Bilirubin, UA small    Ketones, UA Negative    Spec Grav, UA >=1.030 (A) 1.010 - 1.025   Blood, UA Large    pH, UA 6.0 5.0 - 8.0   Protein, UA Positive (A) Negative   Urobilinogen, UA 0.2 0.2 or 1.0 E.U./dL   Nitrite, UA Negative    Leukocytes, UA Large (3+) (A) Negative   Appearance yellow    Odor foul   Urine Culture   Collection Time: 11/10/23 10:13 AM   Specimen: Urine  Result Value Ref Range   MICRO NUMBER: [id number]    SPECIMEN QUALITY: Adequate    Sample Source URINE    STATUS: FINAL    ISOLATE 1: Escherichia coli (A)       Susceptibility   Escherichia coli - URINE CULTURE, REFLEX    AMOX/CLAVULANIC 4 Sensitive     AMPICILLIN 8 Sensitive     AMPICILLIN/SULBACTAM 4 Sensitive     CEFAZOLIN* <=4 Not Reportable      * For infections other than uncomplicated UTI caused by E. coli, K. pneumoniae or P. mirabilis: Cefazolin is resistant if MIC > or = 8 mcg/mL. (Distinguishing susceptible versus intermediate for isolates with MIC < or = 4 mcg/mL requires additional testing.) For uncomplicated UTI caused by E. coli, K. pneumoniae or P. mirabilis: Cefazolin is susceptible if MIC <32 mcg/mL and predicts susceptible to the oral agents cefaclor, cefdinir, cefpodoxime, cefprozil, cefuroxime, cephalexin and loracarbef.     CEFTAZIDIME <=1 Sensitive     CEFEPIME <=1 Sensitive     CEFTRIAXONE <=1 Sensitive     CIPROFLOXACIN <=0.25 Sensitive      LEVOFLOXACIN <=0.12 Sensitive     GENTAMICIN <=1 Sensitive     IMIPENEM <=0.25 Sensitive     NITROFURANTOIN 32 Sensitive     PIP/TAZO <=4 Sensitive     TOBRAMYCIN <=1 Sensitive     TRIMETH/SULFA* <=20 Sensitive      * For infections other than uncomplicated UTI caused by E. coli, K. pneumoniae or P. mirabilis: Cefazolin is resistant if MIC > or = 8 mcg/mL. (Distinguishing susceptible versus intermediate for isolates with MIC < or = 4 mcg/mL requires additional testing.) For uncomplicated UTI caused by E. coli, K. pneumoniae or P. mirabilis: Cefazolin is  susceptible if MIC <32 mcg/mL and predicts susceptible to the oral agents cefaclor, cefdinir, cefpodoxime, cefprozil, cefuroxime, cephalexin and loracarbef. Legend: S = Susceptible  I = Intermediate R = Resistant  NS = Not susceptible SDD = Susceptible Dose Dependent * = Not Tested  NR = Not Reported **NN = See Therapy Comments        Assessment & Plan:   1. Upper respiratory tract infection, unspecified type (Primary) 2+ weeks ago most sx improving except cough/SOB - DG Chest 2 View; Future  2. Wheeze Wheezy SOB with exertion/activity recently On exam at rest no increased WOB, lungs CTA A&P, some coughing fits with deep breathing Will treat for wheeze per her hx with steroids and inhalers Continue cough meds and mucinex OTC CXR ordered if not improving after a few days of steroids, or if any worsening she was told to notify us in office so we could review her sx and CXR and decide if she needed reexamination - predniSONE (DELTASONE) 20 MG tablet; Take 2 tablets (40 mg total) by mouth daily with breakfast for 5 days.  Dispense: 10 tablet; Refill: 0 - albuterol (VENTOLIN HFA) 108 (90 Base) MCG/ACT inhaler; Inhale 2 puffs into the lungs every 6 (six) hours as needed for wheezing or shortness of breath.  Dispense: 8 g; Refill: 0 - DG Chest 2 View; Future      Danelle Berry, PA-C 12/26/23 1:08 PM

## 2024-01-02 DIAGNOSIS — H35351 Cystoid macular degeneration, right eye: Secondary | ICD-10-CM | POA: Diagnosis not present

## 2024-01-02 DIAGNOSIS — H2000 Unspecified acute and subacute iridocyclitis: Secondary | ICD-10-CM | POA: Diagnosis not present

## 2024-02-13 ENCOUNTER — Encounter: Payer: Self-pay | Admitting: Internal Medicine

## 2024-02-13 ENCOUNTER — Ambulatory Visit: Admitting: Internal Medicine

## 2024-02-13 ENCOUNTER — Other Ambulatory Visit: Payer: Self-pay

## 2024-02-13 VITALS — BP 128/72 | HR 96 | Temp 97.9°F | Resp 16 | Ht 67.0 in | Wt 191.6 lb

## 2024-02-13 DIAGNOSIS — N3090 Cystitis, unspecified without hematuria: Secondary | ICD-10-CM

## 2024-02-13 DIAGNOSIS — R350 Frequency of micturition: Secondary | ICD-10-CM | POA: Diagnosis not present

## 2024-02-13 LAB — POCT URINALYSIS DIPSTICK
Bilirubin, UA: NEGATIVE
Glucose, UA: NEGATIVE
Ketones, UA: NEGATIVE
Nitrite, UA: POSITIVE
Protein, UA: POSITIVE — AB
Spec Grav, UA: 1.02 (ref 1.010–1.025)
Urobilinogen, UA: 0.2 U/dL
pH, UA: 6 (ref 5.0–8.0)

## 2024-02-13 MED ORDER — NITROFURANTOIN MONOHYD MACRO 100 MG PO CAPS
100.0000 mg | ORAL_CAPSULE | Freq: Two times a day (BID) | ORAL | 0 refills | Status: AC
Start: 2024-02-13 — End: 2024-02-18

## 2024-02-13 NOTE — Progress Notes (Signed)
 Acute Office Visit  Subjective:     Patient ID: Nicole Contreras, female    DOB: 21-Nov-1945, 78 y.o.   MRN: [id number]  Chief Complaint  Patient presents with   Urinary Frequency    Burning, low back pain    Urinary Frequency  Associated symptoms include flank pain, frequency and urgency. Pertinent negatives include no chills or hematuria.   Patient is in today for concerns for UTI.   Discussed the use of AI scribe software for clinical note transcription with the patient, who gave verbal consent to proceed.  History of Present Illness The patient presents with a one-day history of increased urinary frequency and dysuria, which she describes as a burning sensation. She denies hematuria. She reports a recent illness, described as the flu, which lasted a couple of weeks and included nausea and decreased fluid intake. She also reports left-sided back pain, which she noticed today. She denies fevers.   Review of Systems  Constitutional:  Negative for chills and fever.  Gastrointestinal:  Negative for abdominal pain.  Genitourinary:  Positive for dysuria, flank pain, frequency and urgency. Negative for hematuria.        Objective:    BP 128/72 (Cuff Size: Large)   Pulse 96   Temp 97.9 F (36.6 C) (Oral)   Resp 16   Ht 5\' 7"  (1.702 m)   Wt 191 lb 9.6 oz (86.9 kg)   SpO2 98%   BMI 30.01 kg/m    Physical Exam Constitutional:      Appearance: Normal appearance.  HENT:     Head: Normocephalic and atraumatic.  Eyes:     Conjunctiva/sclera: Conjunctivae normal.  Cardiovascular:     Rate and Rhythm: Normal rate and regular rhythm.  Pulmonary:     Effort: Pulmonary effort is normal.     Breath sounds: Normal breath sounds.  Abdominal:     Tenderness: There is no right CVA tenderness or left CVA tenderness.  Skin:    General: Skin is warm and dry.  Neurological:     General: No focal deficit present.     Mental Status: She is alert. Mental status is at baseline.   Psychiatric:        Mood and Affect: Mood normal.        Behavior: Behavior normal.     Results for orders placed or performed in visit on 02/13/24  POCT urinalysis dipstick  Result Value Ref Range   Color, UA gold    Clarity, UA cloudy    Glucose, UA Negative Negative   Bilirubin, UA negative    Ketones, UA negative    Spec Grav, UA 1.020 1.010 - 1.025   Blood, UA large    pH, UA 6.0 5.0 - 8.0   Protein, UA Positive (A) Negative   Urobilinogen, UA 0.2 0.2 or 1.0 E.U./dL   Nitrite, UA positive    Leukocytes, UA Large (3+) (A) Negative   Appearance cloudy    Odor none         Assessment & Plan:   Assessment and Plan Assessment & Plan Urinary Tract Infection (UTI) Urinalysis indicates bacterial infection with 3+ leukocytes, nitrated and blood. Differential includes kidney stone or pyelonephritis, but rapid onset makes pyelonephritis less likely. Sulfa allergy and doxycycline intolerance noted. Informed consent for Macrobid given. - Prescribe Macrobid 100 mg twice daily for 5 days. - Send urine sample for culture to identify bacteria and antibiotic sensitivity. - Advise to report worsening symptoms, fever, gross hematuria,  or increased back pain. - Recommend Azo for dysuria relief, cautioning about urine discoloration. - Encourage increased fluid intake.  - POCT urinalysis dipstick - Urine Culture - nitrofurantoin, macrocrystal-monohydrate, (MACROBID) 100 MG capsule; Take 1 capsule (100 mg total) by mouth 2 (two) times daily for 5 days.  Dispense: 10 capsule; Refill: 0  Return if symptoms worsen or fail to improve.  Margarita Mail, DO

## 2024-02-15 ENCOUNTER — Encounter: Payer: Self-pay | Admitting: Internal Medicine

## 2024-02-15 LAB — URINE CULTURE
MICRO NUMBER:: 16304422
SPECIMEN QUALITY:: ADEQUATE

## 2024-03-20 ENCOUNTER — Encounter: Payer: Self-pay | Admitting: Family Medicine

## 2024-03-20 ENCOUNTER — Ambulatory Visit: Payer: Self-pay | Admitting: Family Medicine

## 2024-03-20 VITALS — BP 134/82 | HR 96 | Resp 16 | Ht 67.0 in | Wt 190.8 lb

## 2024-03-20 DIAGNOSIS — I700 Atherosclerosis of aorta: Secondary | ICD-10-CM

## 2024-03-20 DIAGNOSIS — Z79899 Other long term (current) drug therapy: Secondary | ICD-10-CM

## 2024-03-20 DIAGNOSIS — L4050 Arthropathic psoriasis, unspecified: Secondary | ICD-10-CM | POA: Diagnosis not present

## 2024-03-20 DIAGNOSIS — M8580 Other specified disorders of bone density and structure, unspecified site: Secondary | ICD-10-CM

## 2024-03-20 DIAGNOSIS — Z780 Asymptomatic menopausal state: Secondary | ICD-10-CM

## 2024-03-20 DIAGNOSIS — Z9011 Acquired absence of right breast and nipple: Secondary | ICD-10-CM

## 2024-03-20 DIAGNOSIS — J301 Allergic rhinitis due to pollen: Secondary | ICD-10-CM

## 2024-03-20 DIAGNOSIS — M545 Low back pain, unspecified: Secondary | ICD-10-CM

## 2024-03-20 DIAGNOSIS — I2510 Atherosclerotic heart disease of native coronary artery without angina pectoris: Secondary | ICD-10-CM

## 2024-03-20 DIAGNOSIS — E039 Hypothyroidism, unspecified: Secondary | ICD-10-CM

## 2024-03-20 LAB — LIPID PANEL
Cholesterol: 165 mg/dL (ref <200)
HDL: 78 mg/dL (ref >=50)
LDL Cholesterol (Calc): 66 mg/dL
Non-HDL Cholesterol (Calc): 87 mg/dL (ref <130)
Total CHOL/HDL Ratio: 2.1 (calc) (ref <5.0)
Triglycerides: 129 mg/dL (ref <150)

## 2024-03-20 LAB — CBC WITH DIFFERENTIAL/PLATELET
Absolute Lymphocytes: 1818 {cells}/uL (ref 850–3900)
Absolute Monocytes: 557 {cells}/uL (ref 200–950)
Basophils Absolute: 78 {cells}/uL (ref 0–200)
Basophils Relative: 0.9 %
Eosinophils Absolute: 122 {cells}/uL (ref 15–500)
Eosinophils Relative: 1.4 %
HCT: 40.9 % (ref 35.0–45.0)
Hemoglobin: 13.3 g/dL (ref 11.7–15.5)
MCH: 27.3 pg (ref 27.0–33.0)
MCHC: 32.5 g/dL (ref 32.0–36.0)
MCV: 84 fL (ref 80.0–100.0)
MPV: 10.1 fL (ref 7.5–12.5)
Monocytes Relative: 6.4 %
Neutro Abs: 6125 {cells}/uL (ref 1500–7800)
Neutrophils Relative %: 70.4 %
Platelets: 263 10*3/uL (ref 140–400)
RBC: 4.87 10*6/uL (ref 3.80–5.10)
RDW: 13.7 % (ref 11.0–15.0)
Total Lymphocyte: 20.9 %
WBC: 8.7 10*3/uL (ref 3.8–10.8)

## 2024-03-20 LAB — COMPREHENSIVE METABOLIC PANEL WITH GFR
AG Ratio: 1.9 (calc) (ref 1.0–2.5)
ALT: 13 U/L (ref 6–29)
AST: 19 U/L (ref 10–35)
Albumin: 4.3 g/dL (ref 3.6–5.1)
Alkaline phosphatase (APISO): 96 U/L (ref 37–153)
BUN: 16 mg/dL (ref 7–25)
CO2: 31 mmol/L (ref 20–32)
Calcium: 9.3 mg/dL (ref 8.6–10.4)
Chloride: 103 mmol/L (ref 98–110)
Creat: 0.81 mg/dL (ref 0.60–1.00)
Globulin: 2.3 g/dL (ref 1.9–3.7)
Glucose, Bld: 95 mg/dL (ref 65–99)
Potassium: 4.5 mmol/L (ref 3.5–5.3)
Sodium: 140 mmol/L (ref 135–146)
Total Bilirubin: 0.5 mg/dL (ref 0.2–1.2)
Total Protein: 6.6 g/dL (ref 6.1–8.1)
eGFR: 76 mL/min/{1.73_m2} (ref >=60)

## 2024-03-20 LAB — VITAMIN D 25 HYDROXY (VIT D DEFICIENCY, FRACTURES): Vit D, 25-Hydroxy: 43 ng/mL (ref 30–100)

## 2024-03-20 LAB — TSH: TSH: 1.38 m[IU]/L (ref 0.40–4.50)

## 2024-03-20 MED ORDER — FLUTICASONE PROPIONATE 50 MCG/ACT NA SUSP
2.0000 | Freq: Every day | NASAL | 2 refills | Status: AC
Start: 2024-03-20 — End: ?

## 2024-03-20 MED ORDER — BACLOFEN 10 MG PO TABS
10.0000 mg | ORAL_TABLET | Freq: Three times a day (TID) | ORAL | 0 refills | Status: AC
Start: 2024-03-20 — End: ?

## 2024-03-20 NOTE — Progress Notes (Signed)
 Name: LAYNI PELTZ   MRN: [id number]    DOB: [birth year]   Date:03/20/2024       Progress Note  Subjective  Chief Complaint  Chief Complaint  Patient presents with   Medical Management of Chronic Issues   Discussed the use of AI scribe software for clinical note transcription with the patient, who gave verbal consent to proceed.  History of Present Illness Nicole Contreras is a 78 year old female who presents for a routine six-month follow-up and lab work.  She has a history of coronary artery disease and aortic atherosclerosis, discovered incidentally during a CT scan for a lung nodule. She manages her condition through diet and does not take cholesterol medication. Her cholesterol levels have been consistently low, with a recent LDL  level of 60 and a previous level of 38 four years ago. She experiences no chest pain, palpitations, or symptoms of a transient ischemic attack such as weakness or speech problems.  Her psoriatic arthritis is managed with Tylenol as needed. She has previously tried Enbrel, methotrexate, and sulfasalazine but experienced side effects like viral infections or UTIs. She follows up with her rheumatologist every six months. Additionally, she has osteoarthritis in her right knee and occasionally receives injections for it.  She has hypothyroidism and takes levothyroxine  75 micrograms daily. She experiences some hair loss, which she attributes to aging, and occasional trouble swallowing, though she does not find it frequent or concerning. No choking is reported.  She has osteopenia with low bone mass noted on her wrist. Her last bone density test was in 2023, showing an overall fracture risk of 11% and a hip fracture risk of 0.7%.  A lung nodule measuring 1.8 cm with a 4 mm solid component in the left lower lobe is monitored annually. Her last scan showed no change, and she continues to follow up with her pulmonologist.  She experienced wheezing in February after  having the flu or COVID, for which she was given an inhaler. She no longer experiences wheezing and does not use the inhaler anymore. She uses a nasal spray occasionally for laryngitis.  She experiences intermittent low back pain, which does not radiate down her legs. The pain is relieved with baclofen 10 mg as needed, heating pads, and topical treatments.  She had breast cancer on the right side many years ago and is up to date with her mammogram on the left side.    Patient Active Problem List   Diagnosis Date Noted   Atherosclerosis of aorta (HCC) 03/21/2023   Nasal congestion 03/18/2022   Psoriasis of scalp 03/18/2022   Osteopenia after menopause 03/18/2022   Psoriasis 03/18/2022   Senile purpura (HCC) 03/18/2022   Chronic pain of right knee 03/18/2022   Bilateral primary osteoarthritis of knee 10/07/2021   History of mastectomy, right 11/27/2018   Internal hemorrhoids 12/01/2016   Benign neoplasm of transverse colon    Numerous moles 11/30/2015   Gastro-esophageal reflux disease without esophagitis 09/14/2015   Acquired hypothyroidism 09/14/2015   Menopause 09/14/2015   Hemorrhage, postmenopausal 09/14/2015   Vitamin D  deficiency 09/14/2015   History of breast cancer 09/14/2015   Psoriatic arthritis (HCC) 05/22/2014    Past Surgical History:  Procedure Laterality Date   BREAST EXCISIONAL BIOPSY Left 1990   X 2- neg   CARDIAC CATHETERIZATION  90's   [facility]; no stent   CATARACT EXTRACTION W/PHACO Right 02/04/2016   Procedure: CATARACT EXTRACTION PHACO AND INTRAOCULAR LENS PLACEMENT (IOC);  Surgeon: Clair Crews, MD;  Location: [facility];  Service: Ophthalmology;  Laterality: Right;  US  00:44 AP% 19.2 CDE 8.49 fluid pack lot # [id number]   CATARACT EXTRACTION W/PHACO Left 06/12/2018   Procedure: CATARACT EXTRACTION PHACO AND INTRAOCULAR LENS PLACEMENT (IOC);  Surgeon: Clair Crews, MD;  Location: [facility];  Service: Ophthalmology;  Laterality: Left;  US  00:56.8 AP%  15.8 CDE 8.95 Fluid Pack lot # [id number]   colonoscopy     COLONOSCOPY WITH PROPOFOL  N/A 07/15/2016   Procedure: COLONOSCOPY WITH PROPOFOL ;  Surgeon: Marnee Sink, MD;  Location: [facility];  Service: Endoscopy;  Laterality: N/A;   ENDOMETRIAL BIOPSY     MASTECTOMY Right 1985   POLYPECTOMY N/A 07/15/2016   Procedure: POLYPECTOMY;  Surgeon: Marnee Sink, MD;  Location: [facility];  Service: Endoscopy;  Laterality: N/A;    Family History  Problem Relation Age of Onset   Uterine cancer Mother    Lung cancer Mother    Stroke Mother    Hypertension Mother    Dementia Mother    Heart disease Father    Arrhythmia Father        afib   Cancer Sister 82       thyroid  cancer   Cervical cancer Sister    Arrhythmia Sister        afib   Breast cancer Sister 33   Breast cancer Paternal Aunt 46    Social History   Tobacco Use   Smoking status: Former    Current packs/day: 0.00    Average packs/day: 1 pack/day for 10.0 years (10.0 ttl pk-yrs)    Types: Cigarettes    Start date: 11/07/1980    Quit date: 11/07/1990    Years since quitting: 33.3   Smokeless tobacco: Never   Tobacco comments:    smoking cessation materials not required  Substance Use Topics   Alcohol use: No    Alcohol/week: 0.0 standard drinks of alcohol     Current Outpatient Medications:    albuterol  (VENTOLIN  HFA) 108 (90 Base) MCG/ACT inhaler, Inhale 2 puffs into the lungs every 6 (six) hours as needed for wheezing or shortness of breath., Disp: 8 g, Rfl: 0   cholecalciferol (VITAMIN D ) 1000 units tablet, Take 1,000 Units by mouth daily. , Disp: , Rfl:    Clobetasol  Propionate (TEMOVATE ) 0.05 % external spray, Apply topically 2 (two) times daily., Disp: 59 mL, Rfl: 0   fluticasone  (FLONASE ) 50 MCG/ACT nasal spray, Place 2 sprays into both nostrils daily., Disp: 16 g, Rfl: 2   ketorolac (ACULAR) 0.5 % ophthalmic solution, SMARTSIG:In Eye(s), Disp: , Rfl:    levothyroxine  (SYNTHROID ) 75 MCG tablet, Take 1  tablet (75 mcg total) by mouth daily., Disp: 90 tablet, Rfl: 3   loratadine  (CLARITIN ) 10 MG tablet, Take 1 tablet (10 mg total) by mouth daily., Disp: 90 tablet, Rfl: 1   Multiple Vitamin (MULTIVITAMIN) tablet, Take 1 tablet by mouth daily., Disp: , Rfl:    prednisoLONE acetate (PRED FORTE) 1 % ophthalmic suspension, SMARTSIG:In Eye(s), Disp: , Rfl:    zinc gluconate 50 MG tablet, Take 50 mg by mouth daily., Disp: , Rfl:    aspirin  EC 81 MG tablet, Take 1 tablet (81 mg total) by mouth daily. Swallow whole. (Patient not taking: Reported on 11/09/2023), Disp: 90 tablet, Rfl: 3  Allergies  Allergen Reactions   Sulfa Antibiotics Rash    I personally reviewed active problem list, medication list, allergies, family history with the patient/caregiver today.   ROS  Ten systems reviewed and  is negative except as mentioned in HPI    Objective Physical Exam Constitutional: Patient appears well-developed and well-nourished. Obese  No distress.  HEENT: head atraumatic, normocephalic, pupils equal and reactive to light, neck supple Cardiovascular: Normal rate, regular rhythm and normal heart sounds.  No murmur heard. No BLE edema. Pulmonary/Chest: Effort normal and breath sounds normal. No respiratory distress. Abdominal: Soft.  There is no tenderness. Psychiatric: Patient has a normal mood and affect. behavior is normal. Judgment and thought content normal.    Vitals:   03/20/24 1015  BP: 134/82  Pulse: 96  Resp: 16  SpO2: 99%  Weight: 190 lb 12.8 oz (86.5 kg)  Height: 5\' 7"  (1.702 m)    Body mass index is 29.88 kg/m.   PHQ2/9:    03/20/2024   10:12 AM 11/10/2023    9:20 AM 11/09/2023    9:26 AM 09/21/2023    9:36 AM 03/21/2023    8:32 AM  Depression screen PHQ 2/9  Decreased Interest 0 0 0 0 0  Down, Depressed, Hopeless 0 0 0 0 0  PHQ - 2 Score 0 0 0 0 0  Altered sleeping  0 0 0 0  Tired, decreased energy  0 0 0 0  Change in appetite  0 0 0 0  Feeling bad or failure about  yourself   0 0 0 0  Trouble concentrating  0 0 0 0  Moving slowly or fidgety/restless  0 0 0 0  Suicidal thoughts  0 0 0 0  PHQ-9 Score  0 0 0 0    phq 9 is negative  Fall Risk:    03/20/2024   10:12 AM 11/10/2023    9:13 AM 11/09/2023    9:29 AM 09/21/2023    9:36 AM 03/21/2023    8:32 AM  Fall Risk   Falls in the past year? 0 0 0 0 0  Number falls in past yr: 0 0 0  0  Injury with Fall? 0 0 0  0  Risk for fall due to : No Fall Risks No Fall Risks No Fall Risks No Fall Risks No Fall Risks  Follow up Falls prevention discussed;Education provided;Falls evaluation completed Falls prevention discussed;Education provided;Falls evaluation completed Falls prevention discussed;Falls evaluation completed Falls prevention discussed Falls prevention discussed     Assessment & Plan Coronary artery disease and aortic atherosclerosis Coronary artery disease and aortic atherosclerosis identified on CT. Statin therapy recommended due to heart disease and psoriatic arthritis risk. Discussed statins' role in plaque stabilization and risk reduction. She is considering low-dose rosuvastatin. - Check cholesterol levels. - Recommend low-dose rosuvastatin 5 mg and she will call when ready to start medication - Discuss potential use of baby aspirin  for cardiovascular protection.  Psoriatic arthritis Psoriatic arthritis managed with symptom control. Not on DMARDs due to side effects. Uses Tylenol for pain. Regular rheumatology follow-ups. - Continue Tylenol as needed for pain. - Follow up with rheumatologist every six months.  Hypothyroidism Hypothyroidism managed with levothyroxine . Occasional difficulty swallowing and hair thinning noted. - Check TSH levels. - Continue levothyroxine  75 mcg daily.  Osteoarthritis of right knee Osteoarthritis with occasional pain, improved after recent illness. Advised on strengthening exercises. - Continue strengthening exercises for quadriceps. - Consider injections  as needed for pain.  Osteopenia Osteopenia noted on wrist. Fracture risk not high enough for osteoporosis treatment. Recheck bone density planned. - Recheck bone density in December. - Check vitamin D  levels.  Lung nodule Persistent lung nodule with annual surveillance  recommended by pulmonologist. - Continue annual surveillance with pulmonologist.  Breast cancer, right Right breast cancer with mastectomy. Up to date with mammograms on left breast.  General Health Maintenance Discussed importance of vitamin D  supplementation post-winter. - Check vitamin D  levels.

## 2024-03-21 ENCOUNTER — Ambulatory Visit: Payer: Self-pay | Admitting: Family Medicine

## 2024-04-06 NOTE — Progress Notes (Unsigned)
 Cardiology Clinic Note   Date: 04/09/2024 ID: Yomayra, Tate [birth year], MRN [id number]  Primary Cardiologist:  Antionette Kirks, MD  Chief Complaint   Nicole Contreras is a 78 y.o. female who presents to the clinic today for routine follow up.   Patient Profile   Nicole Contreras is followed by Dr. Alvenia Aus for the history outlined below.      Past medical history significant for: Nonobstructive CAD. Coronary CTA 04/07/2022: Coronary calcium score 80.2 (62nd percentile).  Mild proximal LAD stenosis. Nuclear stress test 04/17/2023: Normal, low risk study.  LV perfusion is normal with no evidence of ischemia or infarction.  CT attenuation images showed mild aortic calcification and no coronary calcification. Diastolic dysfunction. Echo 04/18/2022: EF 55 to 60%.  No RWMA.  Grade I DD.  Normal RV size/function.  Trivial MR. Palpitations. 30-day cardiac monitor 10/14/2016: NSR with no evidence of A-fib.  Occasional PACs and PVCs.  Very short run of A. tach.  No significant arrhythmia. Hypothyroidism. GERD. Breast cancer. S/p radiation.  In summary, patient was first evaluated by Dr. Alvenia Aus in April 2015 for atypical chest pain.  Echo showed EF 55 to 60%.  She had an unremarkable nuclear stress test in 2017 with no evidence of ischemia or infarct.  Outpatient cardiac monitor in late 2017 showed occasional PACs/PVCs and 1 short run of A. tach.  In 2023 she had complaints of chest pain and underwent coronary CTA in June showing a calcium score of 80 with mild proximal LAD stenosis.  Repeat echo at that time showed normal LV/RV function.  In May 2024 she had an episode of substernal chest pain and tightness lasting 15 minutes.  Following the episode she complained of increased fatigue and shortness of breath.  EKG performed by PCP showed new inferior T wave inversion.  She had normal troponin levels at that time.  Dr. Alvenia Aus initially plan for urgent LHC but insurance refused coverage so she  underwent nuclear stress test which showed no evidence of ischemia or infarction.  Patient was last seen in the office by Nicole Bene, NP on 07/05/2023 for routine follow-up.  She was doing well at that time with no chest pain.  She reported chronic DOE and warm weather unchanged from previous.     History of Present Illness    Today, patient is doing well. She reports 1-2 episodes of chest pain unchanged from previous. She feels she is less fatigued following the episode than she used to be. Patient denies shortness of breath, dyspnea on exertion, lower extremity edema, orthopnea or PND.  She has occasional palpitations. She is active mowing her lawn and working in her garden. She had been walking on her treadmill but she had the flu and was unable to do so. Now that it is warm she will likely not resume her walking until the weather cools off.      ROS: All other systems reviewed and are otherwise negative except as noted in History of Present Illness.  EKGs/Labs Reviewed    EKG Interpretation Date/Time:  Tuesday April 09 2024 09:53:45 EDT Ventricular Rate:  76 PR Interval:  140 QRS Duration:  94 QT Interval:  352 QTC Calculation: 396 R Axis:   1  Text Interpretation: Normal sinus rhythm Left ventricular hypertrophy with repolarization abnormality ( R in aVL , Cornell product ) Nonspecific T wave abnormality Previous EKG  04/27/2023 (not in Muse) No significant change Confirmed by Morey Ar [id number]) on 04/09/2024 10:01:08 AM  03/20/2024: ALT 13; AST 19; BUN 16; Creat 0.81; Potassium 4.5; Sodium 140   03/20/2024: Hemoglobin 13.3; WBC 8.7   03/20/2024: TSH 1.38    Physical Exam    VS:  BP 136/80 (BP Location: Left Arm, Patient Position: Sitting, Cuff Size: Normal)   Pulse 76   Ht 5\' 7"  (1.702 m)   Wt 189 lb 12.8 oz (86.1 kg)   SpO2 98%   BMI 29.73 kg/m  , BMI Body mass index is 29.73 kg/m.  GEN: Well nourished, well developed, in no acute distress. Neck: No JVD or  carotid bruits. Cardiac:  RRR.  No murmurs. No rubs or gallops.   Respiratory:  Respirations regular and unlabored. Clear to auscultation without rales, wheezing or rhonchi. GI: Soft, nontender, nondistended. Extremities: Radials/DP/PT 2+ and equal bilaterally. No clubbing or cyanosis. No edema.  Skin: Warm and dry, no rash. Neuro: Strength intact.  Assessment & Plan   Nonobstructive CAD/chest pain Coronary CTA June 2023 showed calcium score of 80 with mild proximal LAD stenosis.  Nuclear stress test June 2024 was a normal, low risk study with no evidence of ischemia or infarction, CT images showed mild aortic calcification and no coronary calcification.  Patient reports stable episodes of chest pain (1-2 since her last visit in August) unchanged from previous. She feels she is less fatigued following the episode than previously. She has not been taking aspirin  since she had the flu. She is not sure if she wants to start back on it secondary to easy bruising and bleeding. She is not interested in taking a statin secondary to already having arthritic pain. Her last LDL in March was 66. She is active mowing her lawn and gardening. She will resume walking on the treadmill in the cooler temperature months.  - Continue heart health diet.  - Advance physical activity as tolerated.   Palpitations 30-day cardiac monitor December 2017 showed occasional PACs/PVCs and a short run of A. tach.  Patient reports occasional palpitations. EKG shows NSR today.  - No further testing indicated at this time.  Disposition: Return in 1 year or sooner as needed.          Signed, Lonell Rives. Elene Downum, DNP, NP-C

## 2024-04-09 ENCOUNTER — Ambulatory Visit: Attending: Student | Admitting: Student

## 2024-04-09 ENCOUNTER — Encounter: Payer: Self-pay | Admitting: Student

## 2024-04-09 VITALS — BP 136/80 | HR 76 | Ht 67.0 in | Wt 189.8 lb

## 2024-04-09 DIAGNOSIS — R0789 Other chest pain: Secondary | ICD-10-CM | POA: Diagnosis not present

## 2024-04-09 DIAGNOSIS — R002 Palpitations: Secondary | ICD-10-CM

## 2024-04-09 DIAGNOSIS — I2510 Atherosclerotic heart disease of native coronary artery without angina pectoris: Secondary | ICD-10-CM | POA: Diagnosis not present

## 2024-04-09 DIAGNOSIS — R072 Precordial pain: Secondary | ICD-10-CM | POA: Diagnosis not present

## 2024-04-09 NOTE — Patient Instructions (Signed)
 Medication Instructions:  Your Physician recommend you continue on your current medication as directed.    *If you need a refill on your cardiac medications before your next appointment, please call your pharmacy*  Lab Work: None ordered at this time  If you have labs (blood work) drawn today and your tests are completely normal, you will receive your results only by: MyChart Message (if you have MyChart) OR A paper copy in the mail If you have any lab test that is abnormal or we need to change your treatment, we will call you to review the results.  Testing/Procedures: None ordered at this time   Follow-Up: At [facility], you and your health needs are our priority.  As part of our continuing mission to provide you with exceptional heart care, our providers are all part of one team.  This team includes your primary Cardiologist (physician) and Advanced Practice Providers or APPs (Physician Assistants and Nurse Practitioners) who all work together to provide you with the care you need, when you need it.  Your next appointment:   1 year(s)  Provider:   You may see Antionette Kirks, MD or one of the following Advanced Practice Providers on your designated Care Team:   Laneta Pintos, NP Gildardo Labrador, PA-C Varney Gentleman, PA-C Cadence Red [place], PA-C Ronald Cockayne, NP Morey Ar, NP    We recommend signing up for the patient portal called "MyChart".  Sign up information is provided on this After Visit Summary.  MyChart is used to connect with patients for Virtual Visits (Telemedicine).  Patients are able to view lab/test results, encounter notes, upcoming appointments, etc.  Non-urgent messages can be sent to your provider as well.   To learn more about what you can do with MyChart, go to [email or web address].

## 2024-07-10 DIAGNOSIS — H209 Unspecified iridocyclitis: Secondary | ICD-10-CM | POA: Diagnosis not present

## 2024-07-10 DIAGNOSIS — L4050 Arthropathic psoriasis, unspecified: Secondary | ICD-10-CM | POA: Diagnosis not present

## 2024-07-10 DIAGNOSIS — L409 Psoriasis, unspecified: Secondary | ICD-10-CM | POA: Diagnosis not present

## 2024-07-10 DIAGNOSIS — R899 Unspecified abnormal finding in specimens from other organs, systems and tissues: Secondary | ICD-10-CM | POA: Diagnosis not present

## 2024-07-10 DIAGNOSIS — M1711 Unilateral primary osteoarthritis, right knee: Secondary | ICD-10-CM | POA: Diagnosis not present

## 2024-07-23 DIAGNOSIS — H35351 Cystoid macular degeneration, right eye: Secondary | ICD-10-CM | POA: Diagnosis not present

## 2024-08-16 ENCOUNTER — Ambulatory Visit
Admission: RE | Admit: 2024-08-16 | Discharge: 2024-08-16 | Disposition: A | Discharge status: Home / Self Care | Payer: Self-pay | Source: Ambulatory Visit | Attending: Physician Assistant | Admitting: Physician Assistant

## 2024-08-16 ENCOUNTER — Ambulatory Visit (INDEPENDENT_AMBULATORY_CARE_PROVIDER_SITE_OTHER)

## 2024-08-16 ENCOUNTER — Ambulatory Visit: Payer: Self-pay

## 2024-08-16 VITALS — BP 158/86 | HR 73 | Temp 97.9°F | Resp 12 | Wt 190.1 lb

## 2024-08-16 DIAGNOSIS — M1711 Unilateral primary osteoarthritis, right knee: Secondary | ICD-10-CM | POA: Diagnosis not present

## 2024-08-16 DIAGNOSIS — M19071 Primary osteoarthritis, right ankle and foot: Secondary | ICD-10-CM | POA: Diagnosis not present

## 2024-08-16 DIAGNOSIS — L03115 Cellulitis of right lower limb: Secondary | ICD-10-CM

## 2024-08-16 DIAGNOSIS — S9001XA Contusion of right ankle, initial encounter: Secondary | ICD-10-CM | POA: Diagnosis not present

## 2024-08-16 DIAGNOSIS — M2011 Hallux valgus (acquired), right foot: Secondary | ICD-10-CM | POA: Diagnosis not present

## 2024-08-16 DIAGNOSIS — M79604 Pain in right leg: Secondary | ICD-10-CM

## 2024-08-16 DIAGNOSIS — S91001A Unspecified open wound, right ankle, initial encounter: Secondary | ICD-10-CM

## 2024-08-16 DIAGNOSIS — M7731 Calcaneal spur, right foot: Secondary | ICD-10-CM | POA: Diagnosis not present

## 2024-08-16 MED ORDER — DOXYCYCLINE HYCLATE 100 MG PO CAPS: 100.0000 mg | ORAL_CAPSULE | Freq: Two times a day (BID) | ORAL | 0 refills | Status: AC

## 2024-08-16 MED ORDER — CEFDINIR 300 MG PO CAPS: 300.0000 mg | ORAL_CAPSULE | Freq: Two times a day (BID) | ORAL | 0 refills | Status: AC

## 2024-08-16 NOTE — Discharge Instructions (Addendum)
-   The x-rays are negative for underlying fracture and osteomyelitis or bone infection. - You have a poorly healing wound so I placed a referral to wound care for you. - The redness and swelling around the wound are consistent with infection so I sent 2 different antibiotics for you to start.

## 2024-08-16 NOTE — ED Provider Notes (Signed)
 [facility]    CSN: [id number] Arrival date & time: 08/16/24  9073      History   Chief Complaint Chief Complaint  Patient presents with   Abrasion    Clemens and injured right ankle; not healing properly - Entered by patient    HPI Nicole Contreras is a 78 y.o. female presenting for right lower leg/ankle and foot pain with swelling x 3 weeks.  Patient reports she fell in her chicken pen chasing after chickens.  Over the past 2 weeks she has had surrounding redness around the wound.  The area is scabbed over.  Continues to have swelling of the foot.  Reports wound appears not to be healing.  Denies ever having any pustular drainage.  No associated fever, numbness or weakness.  HPI  Past Medical History:  Diagnosis Date   Arthritis    Atypical chest pain    a. 03/2014 ETT: Ex time 4:10, Max HR 160 bpm, no acute st/t changes.   Breast cancer (HCC) 1986   s/p radiation.   Bright's disease    Hx   Diastolic dysfunction    a. 02/2014 Echo: EF 55-60%, no rwma, Gr1 DD; b. 04/2022 Echo: EF 55-60%, no rwma, GrI DD, nl RV size/fxn, triv MR.   Dizziness and giddiness    Fecal smearing    GERD (gastroesophageal reflux disease)    Heart murmur    as child   Insomnia, unspecified    Non-obstructive CAD (coronary artery disease)    a. 04/2022 Cor CTA: Ca2+ = 80.2 (62nd%'ile). LAD 25-49%. Otherwise nl cors; b. 04/2023 MV: EF 66%, no ischemia/infarct.   Obesity, unspecified    Personal history of chemotherapy    Personal history of radiation therapy 1986   right breast ca   PONV (postoperative nausea and vomiting)    after cataract procedure   Postmenopausal bleeding    Psoriasis    Psoriatic arthropathy (HCC)    Reflux esophagitis    Right cataract    a. 01/2016 s/p cataract surgery.   Spasm of muscle    Unspecified hypothyroidism    Unspecified urinary incontinence    Unspecified vitamin D  deficiency     Patient Active Problem List   Diagnosis Date Noted    Atherosclerosis of aorta 03/21/2023   Nasal congestion 03/18/2022   Psoriasis of scalp 03/18/2022   Osteopenia after menopause 03/18/2022   Psoriasis 03/18/2022   Senile purpura 03/18/2022   Chronic pain of right knee 03/18/2022   Bilateral primary osteoarthritis of knee 10/07/2021   History of mastectomy, right 11/27/2018   Internal hemorrhoids 12/01/2016   Benign neoplasm of transverse colon    Numerous moles 11/30/2015   Gastro-esophageal reflux disease without esophagitis 09/14/2015   Acquired hypothyroidism 09/14/2015   Menopause 09/14/2015   Hemorrhage, postmenopausal 09/14/2015   Vitamin D  deficiency 09/14/2015   History of breast cancer 09/14/2015   Psoriatic arthritis (HCC) 05/22/2014    Past Surgical History:  Procedure Laterality Date   BREAST EXCISIONAL BIOPSY Left 1990   X 2- neg   CARDIAC CATHETERIZATION  90's   [facility]; no stent   CATARACT EXTRACTION W/PHACO Right 02/04/2016   Procedure: CATARACT EXTRACTION PHACO AND INTRAOCULAR LENS PLACEMENT (IOC);  Surgeon: Elsie Carmine, MD;  Location: [facility];  Service: Ophthalmology;  Laterality: Right;  US  00:44 AP% 19.2 CDE 8.49 fluid pack lot # [id number]   CATARACT EXTRACTION W/PHACO Left 06/12/2018   Procedure: CATARACT EXTRACTION PHACO AND INTRAOCULAR LENS PLACEMENT (IOC);  Surgeon:  Jaye Fallow, MD;  Location: [facility];  Service: Ophthalmology;  Laterality: Left;  US  00:56.8 AP% 15.8 CDE 8.95 Fluid Pack lot # [id number]   colonoscopy     COLONOSCOPY WITH PROPOFOL  N/A 07/15/2016   Procedure: COLONOSCOPY WITH PROPOFOL ;  Surgeon: Rogelia Copping, MD;  Location: [facility];  Service: Endoscopy;  Laterality: N/A;   ENDOMETRIAL BIOPSY     MASTECTOMY Right 1985   POLYPECTOMY N/A 07/15/2016   Procedure: POLYPECTOMY;  Surgeon: Rogelia Copping, MD;  Location: [facility];  Service: Endoscopy;  Laterality: N/A;    OB History   No obstetric history on file.      Home Medications    Prior to Admission  medications   Medication Sig Start Date End Date Taking? Authorizing Provider  aspirin  EC 81 MG tablet Take 1 tablet (81 mg total) by mouth daily. Swallow whole. 03/24/23  Yes Darron Deatrice LABOR, MD  baclofen  (LIORESAL ) 10 MG tablet Take 1 tablet (10 mg total) by mouth 3 (three) times daily. 03/20/24  Yes Sowles, Krichna, MD  cefdinir (OMNICEF) 300 MG capsule Take 1 capsule (300 mg total) by mouth 2 (two) times daily for 7 days. 08/16/24 08/23/24 Yes Arvis Huxley B, PA-C  cholecalciferol (VITAMIN D ) 1000 units tablet Take 1,000 Units by mouth daily.    Yes [provider]  Clobetasol  Propionate (TEMOVATE ) 0.05 % external spray Apply topically 2 (two) times daily. 08/08/17  Yes Sowles, Krichna, MD  doxycycline (VIBRAMYCIN) 100 MG capsule Take 1 capsule (100 mg total) by mouth 2 (two) times daily for 7 days. 08/16/24 08/23/24 Yes Arvis Huxley NOVAK, PA-C  fluticasone  (FLONASE ) 50 MCG/ACT nasal spray Place 2 sprays into both nostrils daily. 03/20/24  Yes Sowles, Krichna, MD  ketorolac (ACULAR) 0.5 % ophthalmic solution SMARTSIG:In Eye(s) 02/13/24  Yes [provider]  levothyroxine  (SYNTHROID ) 75 MCG tablet Take 1 tablet (75 mcg total) by mouth daily. 09/21/23  Yes Sowles, Krichna, MD  loratadine  (CLARITIN ) 10 MG tablet Take 1 tablet (10 mg total) by mouth daily. 03/18/22  Yes Sowles, Krichna, MD  Multiple Vitamin (MULTIVITAMIN) tablet Take 1 tablet by mouth daily.   Yes [provider]  prednisoLONE acetate (PRED FORTE) 1 % ophthalmic suspension SMARTSIG:In Eye(s) 02/14/24  Yes [provider]  zinc gluconate 50 MG tablet Take 50 mg by mouth daily.   Yes [provider]    Family History Family History  Problem Relation Age of Onset   Uterine cancer Mother    Lung cancer Mother    Stroke Mother    Hypertension Mother    Dementia Mother    Heart disease Father    Arrhythmia Father        afib   Cancer Sister 26       thyroid  cancer   Cervical cancer Sister     Arrhythmia Sister        afib   Breast cancer Sister 68   Breast cancer Paternal Aunt 51    Social History Social History   Tobacco Use   Smoking status: Former    Current packs/day: 0.00    Average packs/day: 1 pack/day for 10.0 years (10.0 ttl pk-yrs)    Types: Cigarettes    Start date: 11/07/1980    Quit date: 11/07/1990    Years since quitting: 33.7   Smokeless tobacco: Never   Tobacco comments:    smoking cessation materials not required  Vaping Use   Vaping status: Never Used  Substance Use Topics  Alcohol use: No    Alcohol/week: 0.0 standard drinks of alcohol   Drug use: No     Allergies   Sulfa antibiotics   Review of Systems Review of Systems  Constitutional:  Negative for fatigue and fever.  Musculoskeletal:  Positive for arthralgias and joint swelling. Negative for gait problem.  Skin:  Positive for color change and wound.  Neurological:  Negative for weakness and numbness.     Physical Exam Triage Vital Signs ED Triage Vitals  Encounter Vitals Group     BP 08/16/24 1001 (!) 158/86     Girls Systolic BP Percentile --      Girls Diastolic BP Percentile --      Boys Systolic BP Percentile --      Boys Diastolic BP Percentile --      Pulse Rate 08/16/24 1001 73     Resp 08/16/24 1001 12     Temp 08/16/24 1001 97.9 F (36.6 C)     Temp Source 08/16/24 1001 Oral     SpO2 08/16/24 1001 94 %     Weight 08/16/24 1000 190 lb 1.6 oz (86.2 kg)     Height --      Head Circumference --      Peak Flow --      Pain Score 08/16/24 1000 1     Pain Loc --      Pain Education --      Exclude from Growth Chart --    No data found.  Updated Vital Signs BP (!) 158/86 (BP Location: Left Arm)   Pulse 73   Temp 97.9 F (36.6 C) (Oral)   Resp 12   Wt 190 lb 1.6 oz (86.2 kg)   SpO2 94%   BMI 29.77 kg/m   Physical Exam Vitals and nursing note reviewed.  Constitutional:      General: She is not in acute distress.    Appearance: Normal appearance. She  is not ill-appearing or toxic-appearing.  HENT:     Head: Normocephalic and atraumatic.  Eyes:     General: No scleral icterus.       Right eye: No discharge.        Left eye: No discharge.     Conjunctiva/sclera: Conjunctivae normal.  Cardiovascular:     Rate and Rhythm: Normal rate.     Pulses: Normal pulses.  Pulmonary:     Effort: Pulmonary effort is normal. No respiratory distress.  Musculoskeletal:     Cervical back: Neck supple.     Comments: RIGHT LEG: See image below. Wound lateral lower leg/ankle with dark scabbed over center and surrounding erythema/swelling. TTP lateral malleolus/distal fibula, dorsal hindfoot. Full ROM.   Skin:    General: Skin is dry.  Neurological:     General: No focal deficit present.     Mental Status: She is alert. Mental status is at baseline.     Motor: No weakness.     Gait: Gait normal.  Psychiatric:        Mood and Affect: Mood normal.        Behavior: Behavior normal.      UC Treatments / Results  Labs (all labs ordered are listed, but only abnormal results are displayed) Labs Reviewed - No data to display  EKG   Radiology DG Tibia/Fibula Right Result Date: 08/16/2024 EXAM: VIEW(S) XRAY OF THE RIGHT TIBIA AND FIBULA 08/16/2024 10:40:49 AM COMPARISON: 09/16/2021 CLINICAL HISTORY: Right ankle injury x3 weeks, fall, poorly healing wound, swelling ankle/foot,  scab, bruising, redness. FINDINGS: BONES AND JOINTS: Tricompartmental degenerative joint disease (DJD) in the knee. Small calcaneal spurs. No acute fracture. No focal osseous lesion. No joint dislocation. SOFT TISSUES: The soft tissues are unremarkable. IMPRESSION: 1. No acute abnormalities. Electronically signed by: Dayne Hassell MD 08/16/2024 11:15 AM EDT RP Workstation: [id number]   DG Foot Complete Right Result Date: 08/16/2024 EXAM: 3 OR MORE VIEW(S) XRAY OF THE RIGHT FOOT 08/16/2024 10:40:49 AM COMPARISON: None available. CLINICAL HISTORY: Right ankle injury x3 weeks, fall,  toe trauma, poorly healing wound, swelling, bruising, and redness. FINDINGS: BONES AND JOINTS: No acute fracture. No focal osseous lesion. No joint dislocation. Mild hallux valgus deformity. Small juxtarticular lucencies at the great toe interphalangeal joint and fifth mtp joint. Degenerative changes in the DIP joints of the second through fourth toes. Small calcaneal spurs. SOFT TISSUES: The soft tissues are unremarkable. IMPRESSION: 1. No acute fracture or dislocation. 2. Mild hallux valgus deformity. 3. Juxta-articular erosions at the great toe interphalangeal joint and fifth MTP joint. 4. Degenerative changes in the DIP joints of the second through fourth toes. 5. Small calcaneal spurs. Electronically signed by: Katheleen Faes MD 08/16/2024 10:58 AM EDT RP Workstation: [id number]    Procedures Procedures (including critical care time)  Medications Ordered in UC Medications - No data to display  Initial Impression / Assessment and Plan / UC Course  I have reviewed the triage vital signs and the nursing notes.  Pertinent labs & imaging results that were available during my care of the patient were reviewed by me and considered in my medical decision making (see chart for details).   78 year old female presents for right lower leg/ankle pain, swelling after falling in her chicken coop 3 weeks ago.  2-week history of surrounding erythema of the wound.  No fever or pustular drainage.  See image included in chart.  X-ray of tib-fib and foot obtained to assess for underlying fracture and osteomyelitis. Imaging negative for fracture and osteomyelitis.   Poorly healing wound of right ankle x 3 weeks with dilators.  Treating at this time with cefdinir, doxycycline and referral placed to wound care.  Advised patient to clean the area with soap and water , apply topical Neosporin and take all antibiotics as directed.  If she develops fever before she sees wound care she should go to the ER.  1 acute  complicated injury.    Final Clinical Impressions(s) / UC Diagnoses   Final diagnoses:  Open wound of right ankle, initial encounter  Right leg pain  Cellulitis of right ankle     Discharge Instructions      - The x-rays are negative for underlying fracture and osteomyelitis or bone infection. - You have a poorly healing wound so I placed a referral to wound care for you. - The redness and swelling around the wound are consistent with infection so I sent 2 different antibiotics for you to start.     ED Prescriptions     Medication Sig Dispense Auth. Provider   cefdinir (OMNICEF) 300 MG capsule Take 1 capsule (300 mg total) by mouth 2 (two) times daily for 7 days. 14 capsule Arvis Huxley B, PA-C   doxycycline (VIBRAMYCIN) 100 MG capsule Take 1 capsule (100 mg total) by mouth 2 (two) times daily for 7 days. 14 capsule Arvis Huxley NOVAK, PA-C      PDMP not reviewed this encounter.   Arvis Huxley NOVAK, PA-C 08/16/24 1135

## 2024-08-16 NOTE — ED Triage Notes (Signed)
 Pt is with her husband  Pt c/o Right ankle injury x3weeks  Pt states that she was in the chicken pen chasing chickens, and fell  Pt has a scab across the lateral side of the right ankle, bruising along the top of the foot and 2nd and 3rd toe, redness along the lower leg.

## 2024-08-27 DIAGNOSIS — H2000 Unspecified acute and subacute iridocyclitis: Secondary | ICD-10-CM | POA: Diagnosis not present

## 2024-08-27 DIAGNOSIS — H35351 Cystoid macular degeneration, right eye: Secondary | ICD-10-CM | POA: Diagnosis not present

## 2024-09-03 ENCOUNTER — Encounter: Attending: Physician Assistant | Admitting: Physician Assistant

## 2024-09-03 DIAGNOSIS — L97812 Non-pressure chronic ulcer of other part of right lower leg with fat layer exposed: Secondary | ICD-10-CM | POA: Insufficient documentation

## 2024-09-03 DIAGNOSIS — L409 Psoriasis, unspecified: Secondary | ICD-10-CM | POA: Insufficient documentation

## 2024-09-03 DIAGNOSIS — I87331 Chronic venous hypertension (idiopathic) with ulcer and inflammation of right lower extremity: Secondary | ICD-10-CM | POA: Diagnosis not present

## 2024-09-03 DIAGNOSIS — E038 Other specified hypothyroidism: Secondary | ICD-10-CM | POA: Insufficient documentation

## 2024-09-04 DIAGNOSIS — E038 Other specified hypothyroidism: Secondary | ICD-10-CM | POA: Diagnosis not present

## 2024-09-04 DIAGNOSIS — L409 Psoriasis, unspecified: Secondary | ICD-10-CM | POA: Diagnosis not present

## 2024-09-04 DIAGNOSIS — I87331 Chronic venous hypertension (idiopathic) with ulcer and inflammation of right lower extremity: Secondary | ICD-10-CM | POA: Diagnosis not present

## 2024-09-11 ENCOUNTER — Encounter: Admitting: Physician Assistant

## 2024-09-11 ENCOUNTER — Encounter: Attending: Physician Assistant | Admitting: Physician Assistant

## 2024-09-11 DIAGNOSIS — L409 Psoriasis, unspecified: Secondary | ICD-10-CM | POA: Insufficient documentation

## 2024-09-11 DIAGNOSIS — E038 Other specified hypothyroidism: Secondary | ICD-10-CM | POA: Insufficient documentation

## 2024-09-11 DIAGNOSIS — I87331 Chronic venous hypertension (idiopathic) with ulcer and inflammation of right lower extremity: Secondary | ICD-10-CM | POA: Diagnosis not present

## 2024-09-11 DIAGNOSIS — L97812 Non-pressure chronic ulcer of other part of right lower leg with fat layer exposed: Secondary | ICD-10-CM | POA: Insufficient documentation

## 2024-09-18 ENCOUNTER — Encounter: Admitting: Physician Assistant

## 2024-09-18 DIAGNOSIS — L97812 Non-pressure chronic ulcer of other part of right lower leg with fat layer exposed: Secondary | ICD-10-CM | POA: Diagnosis not present

## 2024-09-18 DIAGNOSIS — I87331 Chronic venous hypertension (idiopathic) with ulcer and inflammation of right lower extremity: Secondary | ICD-10-CM | POA: Diagnosis not present

## 2024-09-19 ENCOUNTER — Encounter: Admitting: Physician Assistant

## 2024-09-19 DIAGNOSIS — I87331 Chronic venous hypertension (idiopathic) with ulcer and inflammation of right lower extremity: Secondary | ICD-10-CM | POA: Diagnosis not present

## 2024-09-19 DIAGNOSIS — E038 Other specified hypothyroidism: Secondary | ICD-10-CM | POA: Diagnosis not present

## 2024-09-19 DIAGNOSIS — L97812 Non-pressure chronic ulcer of other part of right lower leg with fat layer exposed: Secondary | ICD-10-CM | POA: Diagnosis not present

## 2024-09-19 DIAGNOSIS — L409 Psoriasis, unspecified: Secondary | ICD-10-CM | POA: Diagnosis not present

## 2024-09-20 ENCOUNTER — Encounter: Payer: Self-pay | Admitting: Family Medicine

## 2024-09-20 ENCOUNTER — Ambulatory Visit: Admitting: Family Medicine

## 2024-09-20 VITALS — BP 132/80 | HR 92 | Resp 18 | Ht 67.0 in | Wt 189.3 lb

## 2024-09-20 DIAGNOSIS — Z1231 Encounter for screening mammogram for malignant neoplasm of breast: Secondary | ICD-10-CM

## 2024-09-20 DIAGNOSIS — E039 Hypothyroidism, unspecified: Secondary | ICD-10-CM | POA: Diagnosis not present

## 2024-09-20 DIAGNOSIS — I2510 Atherosclerotic heart disease of native coronary artery without angina pectoris: Secondary | ICD-10-CM | POA: Diagnosis not present

## 2024-09-20 DIAGNOSIS — Z23 Encounter for immunization: Secondary | ICD-10-CM

## 2024-09-20 DIAGNOSIS — Z780 Asymptomatic menopausal state: Secondary | ICD-10-CM | POA: Diagnosis not present

## 2024-09-20 DIAGNOSIS — L4050 Arthropathic psoriasis, unspecified: Secondary | ICD-10-CM | POA: Diagnosis not present

## 2024-09-20 DIAGNOSIS — I10 Essential (primary) hypertension: Secondary | ICD-10-CM

## 2024-09-20 DIAGNOSIS — L089 Local infection of the skin and subcutaneous tissue, unspecified: Secondary | ICD-10-CM

## 2024-09-20 DIAGNOSIS — R911 Solitary pulmonary nodule: Secondary | ICD-10-CM

## 2024-09-20 DIAGNOSIS — I700 Atherosclerosis of aorta: Secondary | ICD-10-CM

## 2024-09-20 DIAGNOSIS — M8580 Other specified disorders of bone density and structure, unspecified site: Secondary | ICD-10-CM

## 2024-09-20 DIAGNOSIS — Z9011 Acquired absence of right breast and nipple: Secondary | ICD-10-CM | POA: Diagnosis not present

## 2024-09-20 MED ORDER — VALSARTAN 80 MG PO TABS
80.0000 mg | ORAL_TABLET | Freq: Every day | ORAL | 0 refills | Status: AC
Start: 2024-09-20 — End: ?

## 2024-09-20 NOTE — Progress Notes (Signed)
 Name: Nicole Contreras   MRN: [id number]    DOB: April 07, 1946   Date:09/20/2024       Progress Note  Subjective  Chief Complaint  Chief Complaint  Patient presents with   Medical Management of Chronic Issues   Discussed the use of AI scribe software for clinical note transcription with the patient, who gave verbal consent to proceed.  History of Present Illness Nicole Contreras is a 78 year old female with hypertension and psoriatic arthritis who presents for follow-up on her blood pressure and wound care.  Her blood pressure has shown improvement, although she acknowledges the need to reduce salt intake in her diet. She is not currently on antihypertensive medication. During a recent urgent care visit, her blood pressure was elevated and she was experiencing pain from cellulitis on her right ankle.  She experienced a fall in a chicken coop, resulting in a bruised right ankle. Initially, the skin was intact, but over four weeks, the bruising worsened, leading to swelling and discoloration. She was treated with antibiotics and referred to a wound clinic. The wound, initially a hematoma, developed into an open wound requiring regular cleaning and treatment with iodine  and collagen packs. No pain in the area.  She has a history of psoriatic arthritis and has tried various medications, including sulfasalazine and methotrexate, but is not currently on any specific treatment. She occasionally uses Aleve or diclofenac for pain management. She experiences some stiffness and has a rash on her elbow, but it is not severe.  Her past medical history includes osteopenia, for which she is not currently on medication, and she maintains physical activity and vitamin D  intake. She has a history of breast cancer treated with mastectomy, chemotherapy, and radiation in the 1980s.    She also has a history of coronary artery disease with plaque formation in the aorta and around the heart.  She denies chest  pain  She has hypothyroidism managed with levothyroxine  75 mcg daily. She has some dry skin and hair loss but stable and chronic   She is aware of a lung nodule that has been stable on surveillance and is due for a follow-up CT scan. She has a history of a right mastectomy and is due for a mammogram on the left side.    Patient Active Problem List   Diagnosis Date Noted   Hypertension, benign 09/20/2024   Atherosclerosis of aorta 03/21/2023   Nasal congestion 03/18/2022   Psoriasis of scalp 03/18/2022   Osteopenia after menopause 03/18/2022   Psoriasis 03/18/2022   Senile purpura 03/18/2022   Chronic pain of right knee 03/18/2022   Bilateral primary osteoarthritis of knee 10/07/2021   History of mastectomy, right 11/27/2018   Internal hemorrhoids 12/01/2016   Benign neoplasm of transverse colon    Numerous moles 11/30/2015   Gastro-esophageal reflux disease without esophagitis 09/14/2015   Acquired hypothyroidism 09/14/2015   Menopause 09/14/2015   Hemorrhage, postmenopausal 09/14/2015   Vitamin D  deficiency 09/14/2015   History of breast cancer 09/14/2015   Psoriatic arthritis (HCC) 05/22/2014    Past Surgical History:  Procedure Laterality Date   BREAST EXCISIONAL BIOPSY Left 1990   X 2- neg   CARDIAC CATHETERIZATION  90's   [facility]; no stent   CATARACT EXTRACTION W/PHACO Right 02/04/2016   Procedure: CATARACT EXTRACTION PHACO AND INTRAOCULAR LENS PLACEMENT (IOC);  Surgeon: Elsie Carmine, MD;  Location: [facility];  Service: Ophthalmology;  Laterality: Right;  US  00:44 AP% 19.2 CDE 8.49 fluid pack lot #  [id number]   CATARACT EXTRACTION W/PHACO Left 06/12/2018   Procedure: CATARACT EXTRACTION PHACO AND INTRAOCULAR LENS PLACEMENT (IOC);  Surgeon: Jaye Fallow, MD;  Location: [facility];  Service: Ophthalmology;  Laterality: Left;  US  00:56.8 AP% 15.8 CDE 8.95 Fluid Pack lot # [id number]   colonoscopy     COLONOSCOPY WITH PROPOFOL  N/A 07/15/2016   Procedure: COLONOSCOPY WITH  PROPOFOL ;  Surgeon: Rogelia Copping, MD;  Location: [facility];  Service: Endoscopy;  Laterality: N/A;   ENDOMETRIAL BIOPSY     MASTECTOMY Right 1985   POLYPECTOMY N/A 07/15/2016   Procedure: POLYPECTOMY;  Surgeon: Rogelia Copping, MD;  Location: [facility];  Service: Endoscopy;  Laterality: N/A;    Family History  Problem Relation Age of Onset   Uterine cancer Mother    Lung cancer Mother    Stroke Mother    Hypertension Mother    Dementia Mother    Heart disease Father    Arrhythmia Father        afib   Cancer Sister 23       thyroid  cancer   Cervical cancer Sister    Heart attack Sister    Hypertension Sister    Arrhythmia Sister        afib   Breast cancer Sister 80   Breast cancer Paternal Aunt 67    Social History   Tobacco Use   Smoking status: Former    Current packs/day: 0.00    Average packs/day: 1 pack/day for 10.0 years (10.0 ttl pk-yrs)    Types: Cigarettes    Start date: 11/07/1980    Quit date: 11/07/1990    Years since quitting: 33.8   Smokeless tobacco: Never   Tobacco comments:    smoking cessation materials not required  Substance Use Topics   Alcohol use: No    Alcohol/week: 0.0 standard drinks of alcohol     Current Outpatient Medications:    aspirin  EC 81 MG tablet, Take 1 tablet (81 mg total) by mouth daily. Swallow whole., Disp: 90 tablet, Rfl: 3   baclofen  (LIORESAL ) 10 MG tablet, Take 1 tablet (10 mg total) by mouth 3 (three) times daily., Disp: 60 each, Rfl: 0   cholecalciferol (VITAMIN D ) 1000 units tablet, Take 1,000 Units by mouth daily. , Disp: , Rfl:    Clobetasol  Propionate (TEMOVATE ) 0.05 % external spray, Apply topically 2 (two) times daily., Disp: 59 mL, Rfl: 0   fluticasone  (FLONASE ) 50 MCG/ACT nasal spray, Place 2 sprays into both nostrils daily., Disp: 16 g, Rfl: 2   ketorolac (ACULAR) 0.5 % ophthalmic solution, SMARTSIG:In Eye(s), Disp: , Rfl:    levothyroxine  (SYNTHROID ) 75 MCG tablet, Take 1 tablet (75 mcg total) by  mouth daily., Disp: 90 tablet, Rfl: 3   loratadine  (CLARITIN ) 10 MG tablet, Take 1 tablet (10 mg total) by mouth daily., Disp: 90 tablet, Rfl: 1   Multiple Vitamin (MULTIVITAMIN) tablet, Take 1 tablet by mouth daily., Disp: , Rfl:    prednisoLONE acetate (PRED FORTE) 1 % ophthalmic suspension, SMARTSIG:In Eye(s), Disp: , Rfl:    valsartan (DIOVAN) 80 MG tablet, Take 1 tablet (80 mg total) by mouth daily., Disp: 30 tablet, Rfl: 0   zinc gluconate 50 MG tablet, Take 50 mg by mouth daily., Disp: , Rfl:   Allergies  Allergen Reactions   Sulfa Antibiotics Rash    I personally reviewed active problem list, medication list, allergies, family history with the patient/caregiver today.   ROS  Ten systems reviewed and is negative except  as mentioned in HPI    Objective Physical Exam VITALS: BP- 132/80 CONSTITUTIONAL: Patient appears well-developed and well-nourished. No distress. HEENT: Head atraumatic, normocephalic, neck supple. CARDIOVASCULAR: Normal rate, regular rhythm. Systolic murmur present. No BLE edema. PULMONARY: Effort normal and breath sounds normal. No respiratory distress. ABDOMINAL: There is no tenderness or distention. MUSCULOSKELETAL: Normal gait. Without gross motor or sensory deficit. PSYCHIATRIC: Patient has a normal mood and affect. Behavior is normal. Judgment and thought content normal.  Vitals:   09/20/24 0916  BP: 132/80  Pulse: 92  Resp: 18  SpO2: 98%  Weight: 189 lb 4.8 oz (85.9 kg)  Height: 5' 7 (1.702 m)    Body mass index is 29.65 kg/m.    PHQ2/9:    09/20/2024    9:14 AM 03/20/2024   10:12 AM 11/10/2023    9:20 AM 11/09/2023    9:26 AM 09/21/2023    9:36 AM  Depression screen PHQ 2/9  Decreased Interest 0 0 0 0 0  Down, Depressed, Hopeless 0 0 0 0 0  PHQ - 2 Score 0 0 0 0 0  Altered sleeping   0 0 0  Tired, decreased energy   0 0 0  Change in appetite   0 0 0  Feeling bad or failure about yourself    0 0 0  Trouble concentrating   0 0 0   Moving slowly or fidgety/restless   0 0 0  Suicidal thoughts   0 0 0  PHQ-9 Score   0  0  0      Data saved with a previous flowsheet row definition    phq 9 is negative  Fall Risk:    09/20/2024    9:14 AM 03/20/2024   10:12 AM 11/10/2023    9:13 AM 11/09/2023    9:29 AM 09/21/2023    9:36 AM  Fall Risk   Falls in the past year? 0 0 0 0 0  Number falls in past yr: 0 0 0 0   Injury with Fall? 0 0 0 0   Risk for fall due to : No Fall Risks No Fall Risks No Fall Risks No Fall Risks No Fall Risks  Follow up Falls evaluation completed Falls prevention discussed;Education provided;Falls evaluation completed Falls prevention discussed;Education provided;Falls evaluation completed Falls prevention discussed;Falls evaluation completed Falls prevention discussed   Assessment & Plan Essential hypertension Blood pressure elevated, recent reading 132/80 mmHg. Discussed cardiovascular risks and benefits of valsartan. - Prescribed valsartan 80 mg daily. - Scheduled nurse visit in two weeks for blood pressure check. - Provided 30-day supply of valsartan, potential for 90-day supply if tolerated.  Right ankle wound with hematoma, status post cellulitis, improving Wound improving with treatment, no tetanus shot needed.  Osteopenia post menopausal  Low FRAX score. Emphasized physical activity and vitamin D . - Ordered bone density scan with next mammogram.  Psoriatic arthritis and primary osteoarthritis Psoriatic arthritis with joint pain, previous treatments not tolerated. Managing pain with Aleve. - Continue Aleve as needed for pain management.  Atherosclerotic heart disease of native coronary artery and aorta Plaque formation present, cholesterol well-controlled. Discussed statin and aspirin , declined due to bleeding concerns.  Hypothyroidism Long-standing hypothyroidism, stable TSH levels. Dry skin and thinning hair attributed to aging. - Continue levothyroxine  75 mcg daily.  Solitary  pulmonary nodule, left lower lobe, stable 1.8 cm nodule stable, under pulmonologist surveillance. - Continue annual surveillance with pulmonologist.  Systolic heart murmur with trivial mitral valve regurgitation No significant changes or  need for intervention.  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 - Continue routine mammogram on the left breast.  General Health Maintenance Discussed flu vaccination, reassured non-mRNA vaccine.

## 2024-09-25 ENCOUNTER — Encounter: Admitting: Physician Assistant

## 2024-09-25 DIAGNOSIS — I87331 Chronic venous hypertension (idiopathic) with ulcer and inflammation of right lower extremity: Secondary | ICD-10-CM | POA: Diagnosis not present

## 2024-09-25 DIAGNOSIS — H35351 Cystoid macular degeneration, right eye: Secondary | ICD-10-CM | POA: Diagnosis not present

## 2024-09-25 DIAGNOSIS — L97812 Non-pressure chronic ulcer of other part of right lower leg with fat layer exposed: Secondary | ICD-10-CM | POA: Diagnosis not present

## 2024-09-25 DIAGNOSIS — H2000 Unspecified acute and subacute iridocyclitis: Secondary | ICD-10-CM | POA: Diagnosis not present

## 2024-10-09 ENCOUNTER — Ambulatory Visit

## 2024-10-09 ENCOUNTER — Encounter: Attending: Physician Assistant | Admitting: Physician Assistant

## 2024-10-09 DIAGNOSIS — L97812 Non-pressure chronic ulcer of other part of right lower leg with fat layer exposed: Secondary | ICD-10-CM | POA: Insufficient documentation

## 2024-10-09 DIAGNOSIS — E038 Other specified hypothyroidism: Secondary | ICD-10-CM | POA: Diagnosis not present

## 2024-10-09 DIAGNOSIS — L409 Psoriasis, unspecified: Secondary | ICD-10-CM | POA: Diagnosis not present

## 2024-10-09 DIAGNOSIS — I87331 Chronic venous hypertension (idiopathic) with ulcer and inflammation of right lower extremity: Secondary | ICD-10-CM | POA: Diagnosis not present

## 2024-10-09 DIAGNOSIS — I10 Essential (primary) hypertension: Secondary | ICD-10-CM

## 2024-10-09 NOTE — Progress Notes (Signed)
 Patient is in office today for a nurse visit for Blood Pressure Check. Patient blood pressure was 136/74, Patient No chest pain, No shortness of breath, No dyspnea on exertion, No orthopnea, No paroxysmal nocturnal dyspnea, No edema, No palpitations, No syncope  Per last visit: Essential hypertension Blood pressure elevated, recent reading 132/80 mmHg. Discussed cardiovascular risks and benefits of valsartan . - Prescribed valsartan  80 mg daily. - Scheduled nurse visit in two weeks for blood pressure check. - Provided 30-day supply of valsartan , potential for 90-day supply if tolerated.   Pt states been on valsartan  for 1 week, please advise?

## 2024-10-10 DIAGNOSIS — E038 Other specified hypothyroidism: Secondary | ICD-10-CM | POA: Diagnosis not present

## 2024-10-10 DIAGNOSIS — L97812 Non-pressure chronic ulcer of other part of right lower leg with fat layer exposed: Secondary | ICD-10-CM | POA: Diagnosis not present

## 2024-10-10 DIAGNOSIS — L409 Psoriasis, unspecified: Secondary | ICD-10-CM | POA: Diagnosis not present

## 2024-10-10 DIAGNOSIS — I87331 Chronic venous hypertension (idiopathic) with ulcer and inflammation of right lower extremity: Secondary | ICD-10-CM | POA: Diagnosis not present

## 2024-10-18 ENCOUNTER — Ambulatory Visit
Admission: RE | Admit: 2024-10-18 | Discharge: 2024-10-18 | Disposition: A | Source: Ambulatory Visit | Attending: Pulmonary Disease | Admitting: Pulmonary Disease

## 2024-10-18 DIAGNOSIS — R911 Solitary pulmonary nodule: Secondary | ICD-10-CM

## 2024-10-23 ENCOUNTER — Ambulatory Visit: Payer: Self-pay | Admitting: Pulmonary Disease

## 2024-10-25 ENCOUNTER — Ambulatory Visit: Admitting: Pulmonary Disease

## 2024-10-25 VITALS — BP 136/70 | HR 79 | Temp 97.6°F | Ht 67.0 in | Wt 189.0 lb

## 2024-10-25 DIAGNOSIS — R911 Solitary pulmonary nodule: Secondary | ICD-10-CM

## 2024-10-25 DIAGNOSIS — Z853 Personal history of malignant neoplasm of breast: Secondary | ICD-10-CM | POA: Diagnosis not present

## 2024-10-25 DIAGNOSIS — R0609 Other forms of dyspnea: Secondary | ICD-10-CM | POA: Diagnosis not present

## 2024-10-25 NOTE — Progress Notes (Signed)
 "  Subjective:    Patient ID: Nicole Contreras, female    DOB: Nov 04, 1946, 78 y.o.   MRN: [id number]  Patient Care Team: Sowles, Krichna, MD as PCP - General (Family Medicine) Darron Deatrice LABOR, MD as PCP - Cardiology (Cardiology) Maryl Zachary LELON Mickey., MD as Consulting Physician (Rheumatology) Isenstein, Arin L, MD as Consulting Physician (Dermatology) Hughie Sharper, MD as Anesthesiologist (Orthodontics) Jaye Fallow, MD as Referring Physician (Ophthalmology)  Chief Complaint  Patient presents with   Lung Lesion    No active complaints.    BACKGROUND: 78 year old remote former smoker with minimal smoking history, prior history of breast cancer status post right mastectomy in 1986 presents for follow-up of a subsolid left lower lobe lung nodule.  Other issues include history of psoriatic arthritis with HLA-B27 positive marker.  Quit smoking cigarettes in 1992 a total of 10-pack-year history.  Last visit here was on 18 January 2023.  HPI Discussed the use of AI scribe software for clinical note transcription with the patient, who gave verbal consent to proceed.  History of Present Illness   Nicole Contreras is a 78 year old female with a history of breast cancer who presents for follow-up of a lung nodule in the left lower lobe.  She presents with her husband Nicole Contreras.  She is being monitored for a lung nodule located in the left lower lobe, described as a ground glass nodule with a small solid component. There is a history of scarring in the lung due to prior radiation therapy for breast cancer, which she underwent in 1986.  She experiences shortness of breath, particularly when walking uphill, which she attributes to her weight. She denies significant wheezing, although she occasionally experiences it. She has a history of smoking but quit many years ago.     Most recent chest CT was performed 18 October 2024.  Findings were discussed with the patient and her husband with the  images being shown.   Review of Systems A 10 point review of systems was performed and it is as noted above otherwise negative.   Past Medical History:  Diagnosis Date   Arthritis    Atypical chest pain    a. 03/2014 ETT: Ex time 4:10, Max HR 160 bpm, no acute st/t changes.   Breast cancer (HCC) 1986   s/p radiation.   Bright's disease    Hx   Diastolic dysfunction    a. 02/2014 Echo: EF 55-60%, no rwma, Gr1 DD; b. 04/2022 Echo: EF 55-60%, no rwma, GrI DD, nl RV size/fxn, triv MR.   Dizziness and giddiness    Fecal smearing    GERD (gastroesophageal reflux disease)    Heart murmur    as child   Insomnia, unspecified    Non-obstructive CAD (coronary artery disease)    a. 04/2022 Cor CTA: Ca2+ = 80.2 (62nd%'ile). LAD 25-49%. Otherwise nl cors; b. 04/2023 MV: EF 66%, no ischemia/infarct.   Obesity, unspecified    Personal history of chemotherapy    Personal history of radiation therapy 1986   right breast ca   PONV (postoperative nausea and vomiting)    after cataract procedure   Postmenopausal bleeding    Psoriasis    Psoriatic arthropathy (HCC)    Reflux esophagitis    Right cataract    a. 01/2016 s/p cataract surgery.   Spasm of muscle    Unspecified hypothyroidism    Unspecified urinary incontinence    Unspecified vitamin D  deficiency     Past Surgical History:  Procedure Laterality Date   BREAST EXCISIONAL BIOPSY Left 1990   X 2- neg   CARDIAC CATHETERIZATION  90's   [facility]; no stent   CATARACT EXTRACTION W/PHACO Right 02/04/2016   Procedure: CATARACT EXTRACTION PHACO AND INTRAOCULAR LENS PLACEMENT (IOC);  Surgeon: Elsie Carmine, MD;  Location: [facility];  Service: Ophthalmology;  Laterality: Right;  US  00:44 AP% 19.2 CDE 8.49 fluid pack lot # [id number] H   CATARACT EXTRACTION W/PHACO Left 06/12/2018   Procedure: CATARACT EXTRACTION PHACO AND INTRAOCULAR LENS PLACEMENT (IOC);  Surgeon: Carmine Elsie, MD;  Location: [facility];  Service: Ophthalmology;  Laterality: Left;   US  00:56.8 AP% 15.8 CDE 8.95 Fluid Pack lot # [id number]   colonoscopy     COLONOSCOPY WITH PROPOFOL  N/A 07/15/2016   Procedure: COLONOSCOPY WITH PROPOFOL ;  Surgeon: Rogelia Copping, MD;  Location: [facility];  Service: Endoscopy;  Laterality: N/A;   ENDOMETRIAL BIOPSY     MASTECTOMY Right 1985   POLYPECTOMY N/A 07/15/2016   Procedure: POLYPECTOMY;  Surgeon: Rogelia Copping, MD;  Location: [facility];  Service: Endoscopy;  Laterality: N/A;    Patient Active Problem List   Diagnosis Date Noted   Hypertension, benign 09/20/2024   Atherosclerosis of aorta 03/21/2023   Nasal congestion 03/18/2022   Psoriasis of scalp 03/18/2022   Osteopenia after menopause 03/18/2022   Psoriasis 03/18/2022   Senile purpura 03/18/2022   Chronic pain of right knee 03/18/2022   Bilateral primary osteoarthritis of knee 10/07/2021   History of mastectomy, right 11/27/2018   Internal hemorrhoids 12/01/2016   Benign neoplasm of transverse colon    Numerous moles 11/30/2015   Gastro-esophageal reflux disease without esophagitis 09/14/2015   Acquired hypothyroidism 09/14/2015   Menopause 09/14/2015   Hemorrhage, postmenopausal 09/14/2015   Vitamin D  deficiency 09/14/2015   History of breast cancer 09/14/2015   Psoriatic arthritis (HCC) 05/22/2014    Family History  Problem Relation Age of Onset   Uterine cancer Mother    Lung cancer Mother    Stroke Mother    Hypertension Mother    Dementia Mother    Heart disease Father    Arrhythmia Father        afib   Cancer Sister 96       thyroid  cancer   Cervical cancer Sister    Heart attack Sister    Hypertension Sister    Arrhythmia Sister        afib   Breast cancer Sister 80   Breast cancer Paternal Aunt 62    Social History   Tobacco Use   Smoking status: Former    Current packs/day: 0.00    Average packs/day: 1 pack/day for 10.0 years (10.0 ttl pk-yrs)    Types: Cigarettes    Start date: 11/07/1980    Quit date: 11/07/1990    Years  since quitting: 33.9   Smokeless tobacco: Never   Tobacco comments:    smoking cessation materials not required  Substance Use Topics   Alcohol use: No    Alcohol/week: 0.0 standard drinks of alcohol    Allergies[1]  Active Medications[2]  Immunization History  Administered Date(s) Administered   Fluad Quad(high Dose 65+) 08/16/2019   Fluzone Influenza virus vaccine,trivalent (IIV3), split virus 07/24/2013   INFLUENZA, HIGH DOSE SEASONAL PF 08/08/2017, 09/20/2024   Influenza, Seasonal, Injecte, Preservative Fre 08/16/2012, 08/07/2013   Influenza,inj,Quad PF,6+ Mos 08/04/2014   Influenza,inj,quad, With Preservative 08/31/2018   Influenza-Unspecified 08/24/2015, 08/18/2016, 08/23/2018   Pneumococcal Conjugate-13 09/14/2015   Pneumococcal Polysaccharide-23 08/19/2013, 08/16/2019  Tdap 10/05/2010, 09/16/2020   Zoster, Live 04/08/2011      Objective:     Vitals:   10/25/24 0926  BP: 136/70  Pulse: 79  Temp: 97.6 F (36.4 C)  Height: 5' 7 (1.702 m)  Weight: 189 lb (85.7 kg)  SpO2: 98%  TempSrc: Temporal  BMI (Calculated): 29.59   GENERAL: This is a well-developed, overweight woman, no acute distress, fully ambulatory, no conversational dyspnea. HEAD: Normocephalic, atraumatic.  EYES: Pupils equal, round, reactive to light.  No scleral icterus.  MOUTH: Dentition intact. NECK: Supple. No thyromegaly. Trachea midline. No JVD.  No adenopathy. PULMONARY: Good air entry bilaterally.  No adventitious sounds. CARDIOVASCULAR: S1 and S2. Regular rate and rhythm.  No rubs, murmurs or gallops heard. CHEST: Status post right mastectomy. ABDOMEN: Benign MUSCULOSKELETAL: No joint deformity, no clubbing, no edema.  NEUROLOGIC: No overt focal deficit, no gait disturbance, speech is fluent. SKIN: Intact,warm,dry. PSYCH: Mood and behavior normal  Representative image of the CT performed 18 October 2024 showing the subsolid lung nodule on the left lower lobe (arrow):        Assessment & Plan:     ICD-10-CM   1. Nodule of left lung  R91.1 CT CHEST WO CONTRAST    2. Shortness of breath  R06.02 Pulmonary function test    3. History of breast cancer  Z85.3 CT CHEST WO CONTRAST      Orders Placed This Encounter  Procedures   CT CHEST WO CONTRAST    Standing Status:   Future    Expected Date:   04/25/2025    Expiration Date:   10/25/2025    Preferred imaging location?:   [facility]   Pulmonary function test    Standing Status:   Future    Expiration Date:   10/25/2025    Where should this test be performed?:   Outpatient Pulmonary    What type of PFT is being ordered?:   Full PFT   Discussion:    Solitary pulmonary nodule, left lower lobe Ground glass nodule in the left lower lobe, stable in size, resembling a cloud with a small solid area. Differential includes slow-growing tumors, necessitating close monitoring due to potential for rapid growth. - Scheduled follow-up scan in 6 months to monitor nodule size and characteristics. - Will consider biopsy if significant changes are observed in the nodule.  Focal pulmonary fibrosis post-radiation Focal scarring in the lung due to previous radiation therapy.  No change from prior  Dyspnea on exertion - Ordered pulmonary function tests to establish a baseline and assess for any residual damage from smoking history.    Follow-up in 6 months time after CT chest.  Advised if symptoms do not improve or worsen, to please contact office for sooner follow up or seek emergency care.    I spent 32 minutes of dedicated to the care of this patient on the date of this encounter to include pre-visit review of records, face-to-face time with the patient discussing conditions above, post visit ordering of testing, clinical documentation with the electronic health record, making appropriate referrals as documented, and communicating necessary findings to members of the patients care team.   C. Leita Sanders,  MD Advanced Bronchoscopy P[facility]M [facility]    *This note was dictated using voice recognition software/Dragon.  Despite best efforts to proofread, errors can occur which can change the meaning. Any transcriptional errors that result from this process are unintentional and may not be fully corrected at the time of dictation.    [  1]  Allergies Allergen Reactions   Sulfa Antibiotics Rash  [2]  Current Meds  Medication Sig   aspirin  EC 81 MG tablet Take 1 tablet (81 mg total) by mouth daily. Swallow whole.   baclofen  (LIORESAL ) 10 MG tablet Take 1 tablet (10 mg total) by mouth 3 (three) times daily.   cholecalciferol (VITAMIN D ) 1000 units tablet Take 1,000 Units by mouth daily.    Clobetasol  Propionate (TEMOVATE ) 0.05 % external spray Apply topically 2 (two) times daily.   fluticasone  (FLONASE ) 50 MCG/ACT nasal spray Place 2 sprays into both nostrils daily.   ketorolac (ACULAR) 0.5 % ophthalmic solution SMARTSIG:In Eye(s)   levothyroxine  (SYNTHROID ) 75 MCG tablet Take 1 tablet (75 mcg total) by mouth daily.   loratadine  (CLARITIN ) 10 MG tablet Take 1 tablet (10 mg total) by mouth daily.   Multiple Vitamin (MULTIVITAMIN) tablet Take 1 tablet by mouth daily.   prednisoLONE acetate (PRED FORTE) 1 % ophthalmic suspension SMARTSIG:In Eye(s)   valsartan  (DIOVAN ) 80 MG tablet Take 1 tablet (80 mg total) by mouth daily.   zinc gluconate 50 MG tablet Take 50 mg by mouth daily.   "

## 2024-10-25 NOTE — Patient Instructions (Addendum)
 VISIT SUMMARY:  Nicole Contreras, you had a follow-up appointment today to monitor a lung nodule in your left lower lobe. This nodule has been stable in size, but we need to keep a close watch on it due to its potential to grow. We also discussed the scarring in your lung from previous radiation therapy for breast cancer.  YOUR PLAN:  -SOLITARY PULMONARY NODULE, LEFT LOWER LOBE: A solitary pulmonary nodule is a small, round growth in the lung. Your nodule is stable in size and looks like a cloud with a small solid area. We will do a follow-up scan in 6 months to check for any changes. If the nodule grows or changes significantly, we may need to do a biopsy to examine it more closely.  -FIBROSIS POST-RADIATION: Pulmonary fibrosis is scarring in the lung tissue, you have a small patch of this on the area where you received radiation previously for breast cancer.  -SHORTNESS OF BREATH: We have ordered pulmonary function tests to see how well your lungs are working and to check for any potential damage from your past smoking.  INSTRUCTIONS:  Please schedule a follow-up scan in 6 months to monitor the lung nodule. Additionally, complete the pulmonary function tests as ordered to assess your lung health.

## 2024-10-29 ENCOUNTER — Encounter: Admitting: Physician Assistant

## 2024-10-29 DIAGNOSIS — I87331 Chronic venous hypertension (idiopathic) with ulcer and inflammation of right lower extremity: Secondary | ICD-10-CM | POA: Diagnosis not present

## 2024-11-05 ENCOUNTER — Ambulatory Visit
Admission: RE | Admit: 2024-11-05 | Discharge: 2024-11-05 | Disposition: A | Discharge status: Home / Self Care | Source: Ambulatory Visit | Attending: Family Medicine | Admitting: Family Medicine

## 2024-11-05 ENCOUNTER — Ambulatory Visit: Payer: Self-pay | Admitting: Family Medicine

## 2024-11-05 DIAGNOSIS — Z9011 Acquired absence of right breast and nipple: Secondary | ICD-10-CM | POA: Diagnosis present

## 2024-11-05 DIAGNOSIS — Z780 Asymptomatic menopausal state: Secondary | ICD-10-CM | POA: Insufficient documentation

## 2024-11-05 DIAGNOSIS — Z1231 Encounter for screening mammogram for malignant neoplasm of breast: Secondary | ICD-10-CM | POA: Insufficient documentation

## 2024-11-05 DIAGNOSIS — M8580 Other specified disorders of bone density and structure, unspecified site: Secondary | ICD-10-CM | POA: Insufficient documentation

## 2024-11-19 ENCOUNTER — Other Ambulatory Visit: Payer: Self-pay | Admitting: Family Medicine

## 2024-11-19 DIAGNOSIS — E039 Hypothyroidism, unspecified: Secondary | ICD-10-CM

## 2024-11-20 ENCOUNTER — Other Ambulatory Visit: Payer: Self-pay | Admitting: Family Medicine

## 2024-11-20 DIAGNOSIS — E039 Hypothyroidism, unspecified: Secondary | ICD-10-CM

## 2024-11-20 NOTE — Telephone Encounter (Signed)
 Requested Prescriptions  Pending Prescriptions Disp Refills   levothyroxine  (SYNTHROID ) 75 MCG tablet [Pharmacy Med Name: LEVOTHYROXINE  75 MCG TABLET] 90 tablet 0    Sig: Take 1 tablet (75 mcg total) by mouth daily.     Endocrinology:  Hypothyroid Agents Passed - 11/20/2024  8:48 AM      Passed - TSH in normal range and within 360 days    TSH  Date Value Ref Range Status  03/20/2024 1.38 0.40 - 4.50 mIU/L Final         Passed - Valid encounter within last 12 months    Recent Outpatient Visits           2 months ago Psoriatic arthritis [facility])   [facility] [facility] Glenard Mire, MD   8 months ago Atherosclerosis of aorta   [facility] Glenard Mire, MD   9 months ago Cystitis   [facility] Bernardo Fend, [place]   11 months ago Upper respiratory tract infection, unspecified type   [facility] Leavy Mole, PA-C

## 2024-11-21 NOTE — Telephone Encounter (Signed)
 Rx 11/20/24 #90- too soon, duplicate request Requested Prescriptions  Pending Prescriptions Disp Refills   levothyroxine  (SYNTHROID ) 75 MCG tablet [Pharmacy Med Name: LEVOTHYROXINE  75 MCG TABLET] 90 tablet 0    Sig: Take 1 tablet (75 mcg total) by mouth daily.     Endocrinology:  Hypothyroid Agents Passed - 11/21/2024  3:41 PM      Passed - TSH in normal range and within 360 days    TSH  Date Value Ref Range Status  03/20/2024 1.38 0.40 - 4.50 mIU/L Final         Passed - Valid encounter within last 12 months    Recent Outpatient Visits           2 months ago Psoriatic arthritis [facility])   [facility] Glenard Mire, MD   8 months ago Atherosclerosis of aorta   [facility] Glenard Mire, MD   9 months ago Cystitis   [facility] Bernardo Fend, [place]   11 months ago Upper respiratory tract infection, unspecified type   [facility] Leavy Mole, PA-C

## 2025-01-10 ENCOUNTER — Ambulatory Visit

## 2025-03-20 ENCOUNTER — Ambulatory Visit: Admitting: Family Medicine
